# Patient Record
Sex: Female | Born: 1950
Health system: Southern US, Community
[De-identification: ages and names within clinical notes are randomized; demographics above are authoritative.]

## PROBLEM LIST (undated history)

## (undated) DIAGNOSIS — I480 Paroxysmal atrial fibrillation: Secondary | ICD-10-CM

## (undated) DIAGNOSIS — I4891 Unspecified atrial fibrillation: Secondary | ICD-10-CM

## (undated) DIAGNOSIS — R1310 Dysphagia, unspecified: Secondary | ICD-10-CM

## (undated) DIAGNOSIS — N200 Calculus of kidney: Secondary | ICD-10-CM

## (undated) DIAGNOSIS — K603 Anal fistula, unspecified: Secondary | ICD-10-CM

## (undated) DIAGNOSIS — I341 Nonrheumatic mitral (valve) prolapse: Secondary | ICD-10-CM

## (undated) DIAGNOSIS — N951 Menopausal and female climacteric states: Secondary | ICD-10-CM

## (undated) DIAGNOSIS — I10 Essential (primary) hypertension: Secondary | ICD-10-CM

## (undated) DIAGNOSIS — N3010 Interstitial cystitis (chronic) without hematuria: Secondary | ICD-10-CM

## (undated) DIAGNOSIS — E785 Hyperlipidemia, unspecified: Secondary | ICD-10-CM

## (undated) DIAGNOSIS — K589 Irritable bowel syndrome without diarrhea: Secondary | ICD-10-CM

## (undated) HISTORY — DX: Essential (primary) hypertension: I10

## (undated) HISTORY — DX: Paroxysmal atrial fibrillation: I48.0

## (undated) HISTORY — DX: Anal fistula, unspecified: K60.30

## (undated) HISTORY — DX: Nonrheumatic mitral (valve) prolapse: I34.1

## (undated) HISTORY — PX: CARDIAC ELECTROPHYSIOLOGY MAPPING AND ABLATION: SHX1292

## (undated) HISTORY — DX: Anal fistula: K60.3

## (undated) HISTORY — DX: Irritable bowel syndrome, unspecified: K58.9

## (undated) HISTORY — DX: Hyperlipidemia, unspecified: E78.5

## (undated) HISTORY — DX: Interstitial cystitis (chronic) without hematuria: N30.10

## (undated) HISTORY — DX: Unspecified atrial fibrillation: I48.91

## (undated) HISTORY — DX: Calculus of kidney: N20.0

## (undated) HISTORY — DX: Menopausal and female climacteric states: N95.1

## (undated) HISTORY — DX: Dysphagia, unspecified: R13.10

---

## 1975-11-23 HISTORY — PX: NASAL SEPTUM SURGERY: SHX37

## 1999-04-30 ENCOUNTER — Other Ambulatory Visit: Admission: RE | Admit: 1999-04-30 | Discharge: 1999-04-30 | Payer: Self-pay | Admitting: Internal Medicine

## 1999-05-06 ENCOUNTER — Ambulatory Visit (HOSPITAL_COMMUNITY): Admission: RE | Admit: 1999-05-06 | Discharge: 1999-05-06 | Payer: Self-pay | Admitting: Internal Medicine

## 2000-09-14 ENCOUNTER — Ambulatory Visit (HOSPITAL_COMMUNITY): Admission: RE | Admit: 2000-09-14 | Discharge: 2000-09-14 | Payer: Self-pay | Admitting: Internal Medicine

## 2000-09-14 ENCOUNTER — Encounter: Payer: Self-pay | Admitting: Internal Medicine

## 2001-05-15 ENCOUNTER — Ambulatory Visit (HOSPITAL_COMMUNITY): Admission: RE | Admit: 2001-05-15 | Discharge: 2001-05-15 | Payer: Self-pay | Admitting: Internal Medicine

## 2001-05-15 ENCOUNTER — Encounter: Payer: Self-pay | Admitting: Internal Medicine

## 2001-07-21 ENCOUNTER — Encounter: Payer: Self-pay | Admitting: *Deleted

## 2001-07-21 ENCOUNTER — Observation Stay (HOSPITAL_COMMUNITY): Admission: EM | Admit: 2001-07-21 | Discharge: 2001-07-22 | Payer: Self-pay | Admitting: Emergency Medicine

## 2001-07-28 ENCOUNTER — Ambulatory Visit (HOSPITAL_COMMUNITY): Admission: RE | Admit: 2001-07-28 | Discharge: 2001-07-28 | Payer: Self-pay | Admitting: *Deleted

## 2001-09-19 ENCOUNTER — Encounter: Payer: Self-pay | Admitting: Internal Medicine

## 2001-09-19 ENCOUNTER — Ambulatory Visit (HOSPITAL_COMMUNITY): Admission: RE | Admit: 2001-09-19 | Discharge: 2001-09-19 | Payer: Self-pay | Admitting: Internal Medicine

## 2001-09-26 ENCOUNTER — Other Ambulatory Visit: Admission: RE | Admit: 2001-09-26 | Discharge: 2001-09-26 | Payer: Self-pay | Admitting: Internal Medicine

## 2002-11-06 ENCOUNTER — Other Ambulatory Visit: Admission: RE | Admit: 2002-11-06 | Discharge: 2002-11-06 | Payer: Self-pay | Admitting: Internal Medicine

## 2003-11-11 ENCOUNTER — Ambulatory Visit (HOSPITAL_COMMUNITY): Admission: RE | Admit: 2003-11-11 | Discharge: 2003-11-11 | Payer: Self-pay | Admitting: Internal Medicine

## 2004-07-29 ENCOUNTER — Other Ambulatory Visit: Admission: RE | Admit: 2004-07-29 | Discharge: 2004-07-29 | Payer: Self-pay | Admitting: Internal Medicine

## 2005-02-10 ENCOUNTER — Ambulatory Visit: Payer: Self-pay | Admitting: Internal Medicine

## 2005-02-11 ENCOUNTER — Encounter: Admission: RE | Admit: 2005-02-11 | Discharge: 2005-02-11 | Payer: Self-pay | Admitting: Internal Medicine

## 2005-04-07 ENCOUNTER — Ambulatory Visit: Payer: Self-pay | Admitting: Internal Medicine

## 2005-09-06 ENCOUNTER — Inpatient Hospital Stay (HOSPITAL_COMMUNITY): Admission: EM | Admit: 2005-09-06 | Discharge: 2005-09-11 | Payer: Self-pay | Admitting: Emergency Medicine

## 2005-09-09 ENCOUNTER — Ambulatory Visit: Payer: Self-pay | Admitting: Internal Medicine

## 2005-11-25 ENCOUNTER — Ambulatory Visit: Payer: Self-pay | Admitting: Internal Medicine

## 2005-12-06 ENCOUNTER — Ambulatory Visit: Payer: Self-pay | Admitting: Internal Medicine

## 2005-12-06 ENCOUNTER — Inpatient Hospital Stay (HOSPITAL_COMMUNITY): Admission: EM | Admit: 2005-12-06 | Discharge: 2005-12-10 | Payer: Self-pay | Admitting: Emergency Medicine

## 2005-12-14 ENCOUNTER — Ambulatory Visit (HOSPITAL_COMMUNITY): Admission: RE | Admit: 2005-12-14 | Discharge: 2005-12-14 | Payer: Self-pay | Admitting: *Deleted

## 2005-12-16 ENCOUNTER — Encounter: Admission: RE | Admit: 2005-12-16 | Discharge: 2005-12-16 | Payer: Self-pay | Admitting: Sports Medicine

## 2006-01-04 ENCOUNTER — Ambulatory Visit: Payer: Self-pay | Admitting: Internal Medicine

## 2006-01-04 ENCOUNTER — Inpatient Hospital Stay (HOSPITAL_COMMUNITY): Admission: EM | Admit: 2006-01-04 | Discharge: 2006-01-08 | Payer: Self-pay | Admitting: Emergency Medicine

## 2006-01-04 ENCOUNTER — Ambulatory Visit: Payer: Self-pay | Admitting: Gastroenterology

## 2006-01-06 ENCOUNTER — Ambulatory Visit: Payer: Self-pay | Admitting: Gastroenterology

## 2006-01-14 ENCOUNTER — Emergency Department (HOSPITAL_COMMUNITY): Admission: EM | Admit: 2006-01-14 | Discharge: 2006-01-14 | Payer: Self-pay | Admitting: Emergency Medicine

## 2006-01-14 ENCOUNTER — Ambulatory Visit: Payer: Self-pay | Admitting: Internal Medicine

## 2006-02-09 ENCOUNTER — Ambulatory Visit: Payer: Self-pay | Admitting: Internal Medicine

## 2006-02-17 ENCOUNTER — Ambulatory Visit: Payer: Self-pay | Admitting: Family Medicine

## 2006-02-21 ENCOUNTER — Ambulatory Visit: Admission: RE | Admit: 2006-02-21 | Discharge: 2006-02-21 | Payer: Self-pay | Admitting: Internal Medicine

## 2006-02-21 ENCOUNTER — Ambulatory Visit: Payer: Self-pay | Admitting: Gastroenterology

## 2006-02-22 ENCOUNTER — Ambulatory Visit: Payer: Self-pay | Admitting: Gastroenterology

## 2006-03-03 ENCOUNTER — Ambulatory Visit: Payer: Self-pay | Admitting: Family Medicine

## 2006-03-17 ENCOUNTER — Encounter: Payer: Self-pay | Admitting: Gastroenterology

## 2006-03-17 ENCOUNTER — Ambulatory Visit (HOSPITAL_COMMUNITY): Admission: RE | Admit: 2006-03-17 | Discharge: 2006-03-17 | Payer: Self-pay | Admitting: Gastroenterology

## 2006-03-17 ENCOUNTER — Encounter (INDEPENDENT_AMBULATORY_CARE_PROVIDER_SITE_OTHER): Payer: Self-pay | Admitting: *Deleted

## 2006-03-22 ENCOUNTER — Ambulatory Visit: Payer: Self-pay | Admitting: Internal Medicine

## 2006-03-22 ENCOUNTER — Ambulatory Visit: Payer: Self-pay | Admitting: Gastroenterology

## 2006-03-24 ENCOUNTER — Ambulatory Visit: Payer: Self-pay | Admitting: Internal Medicine

## 2006-03-29 ENCOUNTER — Ambulatory Visit: Payer: Self-pay | Admitting: Family Medicine

## 2006-04-14 ENCOUNTER — Ambulatory Visit: Payer: Self-pay | Admitting: Family Medicine

## 2006-06-14 ENCOUNTER — Ambulatory Visit: Payer: Self-pay | Admitting: Family Medicine

## 2006-06-22 HISTORY — PX: OTHER SURGICAL HISTORY: SHX169

## 2006-08-25 ENCOUNTER — Other Ambulatory Visit: Admission: RE | Admit: 2006-08-25 | Discharge: 2006-08-25 | Payer: Self-pay | Admitting: Family Medicine

## 2006-08-25 ENCOUNTER — Ambulatory Visit: Payer: Self-pay | Admitting: Family Medicine

## 2006-08-30 DIAGNOSIS — F411 Generalized anxiety disorder: Secondary | ICD-10-CM | POA: Insufficient documentation

## 2006-08-30 DIAGNOSIS — I059 Rheumatic mitral valve disease, unspecified: Secondary | ICD-10-CM | POA: Insufficient documentation

## 2006-08-30 DIAGNOSIS — E7800 Pure hypercholesterolemia, unspecified: Secondary | ICD-10-CM

## 2006-08-30 DIAGNOSIS — J309 Allergic rhinitis, unspecified: Secondary | ICD-10-CM | POA: Insufficient documentation

## 2006-08-30 DIAGNOSIS — Z8679 Personal history of other diseases of the circulatory system: Secondary | ICD-10-CM | POA: Insufficient documentation

## 2006-08-30 DIAGNOSIS — I4891 Unspecified atrial fibrillation: Secondary | ICD-10-CM | POA: Insufficient documentation

## 2006-09-08 ENCOUNTER — Ambulatory Visit: Payer: Self-pay | Admitting: Family Medicine

## 2006-10-17 ENCOUNTER — Telehealth (INDEPENDENT_AMBULATORY_CARE_PROVIDER_SITE_OTHER): Payer: Self-pay | Admitting: *Deleted

## 2006-10-24 ENCOUNTER — Ambulatory Visit: Payer: Self-pay | Admitting: Family Medicine

## 2006-10-24 DIAGNOSIS — M25569 Pain in unspecified knee: Secondary | ICD-10-CM | POA: Insufficient documentation

## 2006-10-24 DIAGNOSIS — R1031 Right lower quadrant pain: Secondary | ICD-10-CM

## 2006-11-08 ENCOUNTER — Encounter: Payer: Self-pay | Admitting: Family Medicine

## 2006-11-09 ENCOUNTER — Telehealth: Payer: Self-pay | Admitting: Family Medicine

## 2006-11-10 ENCOUNTER — Encounter: Payer: Self-pay | Admitting: Family Medicine

## 2006-11-17 ENCOUNTER — Ambulatory Visit: Payer: Self-pay | Admitting: Family Medicine

## 2006-11-17 DIAGNOSIS — N3010 Interstitial cystitis (chronic) without hematuria: Secondary | ICD-10-CM | POA: Insufficient documentation

## 2006-11-22 ENCOUNTER — Encounter: Payer: Self-pay | Admitting: Family Medicine

## 2006-11-23 ENCOUNTER — Telehealth (INDEPENDENT_AMBULATORY_CARE_PROVIDER_SITE_OTHER): Payer: Self-pay | Admitting: *Deleted

## 2006-11-23 LAB — CONVERTED CEMR LAB
CA 125: 6.2 units/mL (ref 0.0–30.2)
RBC / HPF: NONE SEEN (ref <3)
WBC, UA: NONE SEEN cells/hpf (ref <3)

## 2006-11-25 ENCOUNTER — Telehealth: Payer: Self-pay | Admitting: Family Medicine

## 2006-12-06 ENCOUNTER — Telehealth (INDEPENDENT_AMBULATORY_CARE_PROVIDER_SITE_OTHER): Payer: Self-pay | Admitting: *Deleted

## 2006-12-27 ENCOUNTER — Encounter: Payer: Self-pay | Admitting: Family Medicine

## 2007-01-09 ENCOUNTER — Encounter: Payer: Self-pay | Admitting: Cardiology

## 2007-01-09 ENCOUNTER — Ambulatory Visit: Payer: Self-pay

## 2007-01-13 ENCOUNTER — Encounter: Payer: Self-pay | Admitting: Family Medicine

## 2007-02-20 ENCOUNTER — Encounter: Payer: Self-pay | Admitting: Family Medicine

## 2007-04-24 ENCOUNTER — Encounter: Payer: Self-pay | Admitting: Family Medicine

## 2007-08-15 ENCOUNTER — Ambulatory Visit: Payer: Self-pay | Admitting: Family Medicine

## 2007-08-15 LAB — CONVERTED CEMR LAB: Rapid Strep: NEGATIVE

## 2007-09-18 ENCOUNTER — Telehealth: Payer: Self-pay | Admitting: Family Medicine

## 2007-09-21 ENCOUNTER — Other Ambulatory Visit: Admission: RE | Admit: 2007-09-21 | Discharge: 2007-09-21 | Payer: Self-pay | Admitting: Family Medicine

## 2007-09-21 ENCOUNTER — Encounter: Payer: Self-pay | Admitting: Family Medicine

## 2007-09-22 ENCOUNTER — Ambulatory Visit: Payer: Self-pay | Admitting: Family Medicine

## 2007-09-22 LAB — CONVERTED CEMR LAB
ALT: 20 units/L (ref 0–35)
AST: 19 units/L (ref 0–37)
Albumin: 4.4 g/dL (ref 3.5–5.2)
Alkaline Phosphatase: 70 units/L (ref 39–117)
BUN: 14 mg/dL (ref 6–23)
CO2: 25 meq/L (ref 19–32)
Calcium: 9.5 mg/dL (ref 8.4–10.5)
Chloride: 107 meq/L (ref 96–112)
Cholesterol, target level: 200 mg/dL
Cholesterol: 212 mg/dL — ABNORMAL HIGH (ref 0–200)
Creatinine, Ser: 0.66 mg/dL (ref 0.40–1.20)
Glucose, Bld: 93 mg/dL (ref 70–99)
HCT: 40 % (ref 36.0–46.0)
HDL goal, serum: 40 mg/dL
HDL: 50 mg/dL (ref >39)
Hemoglobin: 13.2 g/dL (ref 12.0–15.0)
LDL Cholesterol: 128 mg/dL — ABNORMAL HIGH (ref 0–99)
LDL Goal: 160 mg/dL
MCHC: 33 g/dL (ref 30.0–36.0)
MCV: 89.7 fL (ref 78.0–100.0)
Platelets: 262 10*3/uL (ref 150–400)
Potassium: 3.9 meq/L (ref 3.5–5.3)
RBC: 4.46 M/uL (ref 3.87–5.11)
RDW: 12.6 % (ref 11.5–14.0)
Sodium: 143 meq/L (ref 135–145)
TSH: 1.661 microintl units/mL (ref 0.350–5.50)
Total Bilirubin: 0.7 mg/dL (ref 0.3–1.2)
Total CHOL/HDL Ratio: 4.2
Total Protein: 7.3 g/dL (ref 6.0–8.3)
Triglycerides: 170 mg/dL — ABNORMAL HIGH (ref <150)
VLDL: 34 mg/dL (ref 0–40)
WBC: 5.5 10*3/uL (ref 4.0–10.5)

## 2007-09-26 LAB — CONVERTED CEMR LAB: Pap Smear: NORMAL

## 2007-10-03 ENCOUNTER — Encounter: Admission: RE | Admit: 2007-10-03 | Discharge: 2007-10-03 | Payer: Self-pay | Admitting: Family Medicine

## 2007-10-04 ENCOUNTER — Ambulatory Visit: Payer: Self-pay | Admitting: Cardiology

## 2007-10-18 ENCOUNTER — Ambulatory Visit: Payer: Self-pay | Admitting: Cardiology

## 2007-10-31 ENCOUNTER — Telehealth: Payer: Self-pay | Admitting: Family Medicine

## 2007-12-07 ENCOUNTER — Telehealth: Payer: Self-pay | Admitting: Family Medicine

## 2007-12-13 ENCOUNTER — Ambulatory Visit: Payer: Self-pay | Admitting: Cardiology

## 2007-12-13 ENCOUNTER — Encounter: Admission: RE | Admit: 2007-12-13 | Discharge: 2007-12-13 | Payer: Self-pay | Admitting: Family Medicine

## 2007-12-29 ENCOUNTER — Ambulatory Visit: Payer: Self-pay | Admitting: Family Medicine

## 2007-12-29 DIAGNOSIS — L259 Unspecified contact dermatitis, unspecified cause: Secondary | ICD-10-CM

## 2008-01-18 ENCOUNTER — Telehealth (INDEPENDENT_AMBULATORY_CARE_PROVIDER_SITE_OTHER): Payer: Self-pay | Admitting: *Deleted

## 2008-03-06 ENCOUNTER — Ambulatory Visit: Payer: Self-pay | Admitting: Cardiology

## 2008-03-22 ENCOUNTER — Ambulatory Visit: Payer: Self-pay | Admitting: Family Medicine

## 2008-03-22 DIAGNOSIS — H9209 Otalgia, unspecified ear: Secondary | ICD-10-CM | POA: Insufficient documentation

## 2008-03-25 ENCOUNTER — Telehealth: Payer: Self-pay | Admitting: Family Medicine

## 2008-03-27 ENCOUNTER — Telehealth: Payer: Self-pay | Admitting: Family Medicine

## 2008-05-15 ENCOUNTER — Encounter: Admission: RE | Admit: 2008-05-15 | Discharge: 2008-05-15 | Payer: Self-pay | Admitting: Family Medicine

## 2008-05-15 ENCOUNTER — Ambulatory Visit: Payer: Self-pay | Admitting: Family Medicine

## 2008-05-15 DIAGNOSIS — M25539 Pain in unspecified wrist: Secondary | ICD-10-CM

## 2008-05-15 DIAGNOSIS — M25579 Pain in unspecified ankle and joints of unspecified foot: Secondary | ICD-10-CM

## 2008-09-03 ENCOUNTER — Telehealth: Payer: Self-pay | Admitting: Family Medicine

## 2008-09-06 ENCOUNTER — Ambulatory Visit: Payer: Self-pay | Admitting: Occupational Medicine

## 2008-09-18 ENCOUNTER — Ambulatory Visit: Payer: Self-pay | Admitting: Cardiology

## 2008-09-20 ENCOUNTER — Encounter: Payer: Self-pay | Admitting: Family Medicine

## 2008-09-20 LAB — CONVERTED CEMR IMAGING

## 2008-09-23 LAB — CONVERTED CEMR LAB
ALT: 14 units/L (ref 0–35)
AST: 16 units/L (ref 0–37)
Albumin: 4.6 g/dL (ref 3.5–5.2)
Alkaline Phosphatase: 79 units/L (ref 39–117)
BUN: 17 mg/dL (ref 6–23)
CO2: 25 meq/L (ref 19–32)
Calcium: 9.6 mg/dL (ref 8.4–10.5)
Chloride: 105 meq/L (ref 96–112)
Cholesterol: 208 mg/dL — ABNORMAL HIGH (ref 0–200)
Creatinine, Ser: 0.7 mg/dL (ref 0.40–1.20)
Glucose, Bld: 100 mg/dL — ABNORMAL HIGH (ref 70–99)
HDL: 52 mg/dL (ref >39)
LDL Cholesterol: 131 mg/dL — ABNORMAL HIGH (ref 0–99)
Potassium: 4.2 meq/L (ref 3.5–5.3)
Sodium: 142 meq/L (ref 135–145)
TSH: 3.251 microintl units/mL (ref 0.350–4.50)
Total Bilirubin: 1 mg/dL (ref 0.3–1.2)
Total CHOL/HDL Ratio: 4
Total Protein: 7.5 g/dL (ref 6.0–8.3)
Triglycerides: 123 mg/dL (ref <150)
VLDL: 25 mg/dL (ref 0–40)

## 2008-09-25 ENCOUNTER — Ambulatory Visit: Payer: Self-pay | Admitting: Family Medicine

## 2008-09-25 ENCOUNTER — Other Ambulatory Visit: Admission: RE | Admit: 2008-09-25 | Discharge: 2008-09-25 | Payer: Self-pay | Admitting: Family Medicine

## 2008-09-25 ENCOUNTER — Encounter: Payer: Self-pay | Admitting: Family Medicine

## 2008-09-25 DIAGNOSIS — E348 Other specified endocrine disorders: Secondary | ICD-10-CM | POA: Insufficient documentation

## 2008-09-25 LAB — CONVERTED CEMR LAB: Blood Glucose, Fasting: 109 mg/dL

## 2008-09-30 LAB — CONVERTED CEMR LAB: Pap Smear: NORMAL

## 2008-10-15 ENCOUNTER — Encounter: Payer: Self-pay | Admitting: Family Medicine

## 2008-10-15 ENCOUNTER — Encounter: Admission: RE | Admit: 2008-10-15 | Discharge: 2008-10-15 | Payer: Self-pay | Admitting: Family Medicine

## 2008-10-15 DIAGNOSIS — M810 Age-related osteoporosis without current pathological fracture: Secondary | ICD-10-CM

## 2008-10-29 ENCOUNTER — Encounter: Payer: Self-pay | Admitting: Family Medicine

## 2008-11-13 ENCOUNTER — Ambulatory Visit: Payer: Self-pay | Admitting: Family Medicine

## 2008-11-13 DIAGNOSIS — K219 Gastro-esophageal reflux disease without esophagitis: Secondary | ICD-10-CM | POA: Insufficient documentation

## 2009-01-09 ENCOUNTER — Ambulatory Visit: Payer: Self-pay | Admitting: Family Medicine

## 2009-01-09 DIAGNOSIS — M542 Cervicalgia: Secondary | ICD-10-CM | POA: Insufficient documentation

## 2009-02-10 ENCOUNTER — Ambulatory Visit: Payer: Self-pay | Admitting: Internal Medicine

## 2009-02-10 ENCOUNTER — Encounter: Payer: Self-pay | Admitting: Internal Medicine

## 2009-06-02 ENCOUNTER — Telehealth: Payer: Self-pay | Admitting: Family Medicine

## 2009-07-02 ENCOUNTER — Encounter (INDEPENDENT_AMBULATORY_CARE_PROVIDER_SITE_OTHER): Payer: Self-pay | Admitting: *Deleted

## 2009-09-24 ENCOUNTER — Ambulatory Visit: Payer: Self-pay | Admitting: Family Medicine

## 2009-10-13 ENCOUNTER — Ambulatory Visit: Payer: Self-pay | Admitting: Family Medicine

## 2009-10-14 ENCOUNTER — Encounter: Payer: Self-pay | Admitting: Family Medicine

## 2009-11-18 ENCOUNTER — Ambulatory Visit: Payer: Self-pay | Admitting: Family Medicine

## 2009-11-18 LAB — CONVERTED CEMR LAB: Rapid Strep: NEGATIVE

## 2010-01-06 ENCOUNTER — Telehealth: Payer: Self-pay | Admitting: Family Medicine

## 2010-01-26 ENCOUNTER — Telehealth (INDEPENDENT_AMBULATORY_CARE_PROVIDER_SITE_OTHER): Payer: Self-pay | Admitting: *Deleted

## 2010-01-30 ENCOUNTER — Ambulatory Visit: Payer: Self-pay | Admitting: Family Medicine

## 2010-02-01 LAB — CONVERTED CEMR LAB
ALT: 20 units/L (ref 0–35)
AST: 16 units/L (ref 0–37)
Albumin: 4.6 g/dL (ref 3.5–5.2)
Alkaline Phosphatase: 70 units/L (ref 39–117)
BUN: 14 mg/dL (ref 6–23)
CO2: 25 meq/L (ref 19–32)
Calcium: 9.9 mg/dL (ref 8.4–10.5)
Chloride: 105 meq/L (ref 96–112)
Creatinine, Ser: 0.65 mg/dL (ref 0.40–1.20)
Glucose, Bld: 101 mg/dL — ABNORMAL HIGH (ref 70–99)
HCT: 38 % (ref 36.0–46.0)
HDL: 52 mg/dL (ref >39)
Hemoglobin: 12.8 g/dL (ref 12.0–15.0)
LDL Cholesterol: 130 mg/dL — ABNORMAL HIGH (ref 0–99)
MCHC: 33.7 g/dL (ref 30.0–36.0)
Potassium: 3.9 meq/L (ref 3.5–5.3)
RBC: 4.35 M/uL (ref 3.87–5.11)
RDW: 12.5 % (ref 11.5–15.5)
TSH: 2.643 microintl units/mL (ref 0.350–4.500)
Total Bilirubin: 0.8 mg/dL (ref 0.3–1.2)
Total CHOL/HDL Ratio: 4
Total Protein: 7.4 g/dL (ref 6.0–8.3)
Triglycerides: 130 mg/dL (ref <150)
VLDL: 26 mg/dL (ref 0–40)
Vit D, 25-Hydroxy: 24 ng/mL — ABNORMAL LOW (ref 30–89)

## 2010-02-10 ENCOUNTER — Encounter: Admission: RE | Admit: 2010-02-10 | Discharge: 2010-02-10 | Payer: Self-pay | Admitting: Family Medicine

## 2010-02-12 LAB — HM MAMMOGRAPHY: HM Mammogram: NEGATIVE

## 2010-09-23 ENCOUNTER — Ambulatory Visit: Payer: Self-pay | Admitting: Family Medicine

## 2010-09-23 LAB — CONVERTED CEMR LAB: Blood Glucose, Fingerstick: 127

## 2010-10-07 ENCOUNTER — Ambulatory Visit: Payer: Self-pay | Admitting: Cardiology

## 2010-10-07 ENCOUNTER — Encounter: Payer: Self-pay | Admitting: Cardiology

## 2010-10-07 DIAGNOSIS — R002 Palpitations: Secondary | ICD-10-CM | POA: Insufficient documentation

## 2010-10-07 DIAGNOSIS — I4949 Other premature depolarization: Secondary | ICD-10-CM

## 2010-12-13 ENCOUNTER — Encounter: Payer: Self-pay | Admitting: Internal Medicine

## 2010-12-13 ENCOUNTER — Encounter: Payer: Self-pay | Admitting: Family Medicine

## 2010-12-24 NOTE — Assessment & Plan Note (Signed)
Summary: CPE   Vital Signs:  Patient profile:   60 year old female Height:      63 inches Weight:      139 pounds Pulse rate:   64 / minute BP sitting:   109 / 65  (left arm) Cuff size:   regular  Vitals Entered By: Kathlene November (January 30, 2010 9:22 AM) CC: CPE no pap   Primary Care Provider:  Linford Arnold, C  CC:  CPE no pap.  History of Present Illness: Here for CPE. No specific complaints except for feeling her her left side of her jaw to her temple is "drawing up".  Started about a week ago.  Seems to be worse in the AM after sleeping in her side.  No change in hearing (though hx of chronic hearing loss).  No visioin change in that eye.  Has dentures adn no teeth. She was laid off from work in Feb.    Current Medications (verified): 1)  Aspir-Low 81 Mg Tbec (Aspirin) .... Once Daily 2)  Xanax 0.25 Mg Tabs (Alprazolam) .... Two Times A Day As Needed 3)  One-A-Day Essential  Tabs (Multiple Vitamin) .... Take 1 Tablet By Mouth Once A Day 4)  Metoprolol Tartrate 25 Mg  Tabs (Metoprolol Tartrate) .... Take 1 Tablet By Mouth Two Times A Day  Allergies (verified): 1)  ! Ampicillin 2)  ! Codeine 3)  ! Biaxin 4)  ! * Flecanide 5)  ! * Nasonex-Ha 6)  ! Azithromycin (Azithromycin) 7)  ! Penicillin 8)  Keflex (Cephalexin)  Comments:  Nurse/Medical Assistant: The patient's medications and allergies were reviewed with the patient and were updated in the Medication and Allergy Lists. Kathlene November (January 30, 2010 9:23 AM)  Past History:  Past Medical History: Last updated: 02/07/2009 75% Hearing Loss, difficulty swallowing sec need for dentures menopausal Hx of atrila flutter s/p ablation.  Hx of paroxysmal a fib.  Resistant recurrent atrial fibrillation/flutter Swallow dysfunction Possible hypokalemia Dyslipidemia Mitral valve prolapse Nonobstructive coronary artery disease by catheterization September 2; ejection fraction 65%. Remote history of irritable bowel  syndrome. hypertension  Past Surgical History: Last updated: 01/13/2007 Abd U/S-negative c/sec  1971, 1973 Deviated septum  1977 Pulmonary Vein Isolation procedure-for A-fib on 8/07 at [facility]  Family History: Last updated: 08/30/2006 DM-GM, HTN-mom, MI-Dad, brother, Stroke-Aunt  Social History: Unemployed..  GED.  Divorced.  2 adult children.  Never smoked, no EtOH, no drugs, no caffeine.  No regular exercise.  Review of Systems  The patient denies anorexia, fever, weight loss, weight gain, vision loss, decreased hearing, hoarseness, chest pain, syncope, dyspnea on exertion, peripheral edema, prolonged cough, headaches, hemoptysis, abdominal pain, melena, hematochezia, severe indigestion/heartburn, hematuria, incontinence, genital sores, muscle weakness, suspicious skin lesions, transient blindness, difficulty walking, depression, unusual weight change, abnormal bleeding, enlarged lymph nodes, and breast masses.    Physical Exam  General:  Well-developed,well-nourished,in no acute distress; alert,appropriate and cooperative throughout examination Head:  Normocephalic and atraumatic without obvious abnormalities. No apparent alopecia or balding. Eyes:  No corneal or conjunctival inflammation noted. EOMI. Perrla. Ears:  External ear exam shows no significant lesions or deformities.  Otoscopic examination reveals clear canals, tympanic membranes are intact bilaterally without bulging, retraction, inflammation or discharge. Hearing is grossly normal bilaterally. Nose:  External nasal examination shows no deformity or inflammation. Nasal mucosa are pink and moist without lesions or exudates. Mouth:  Oral mucosa and oropharynx without lesions or exudates.  Teeth in good repair. Neck:  No deformities, masses, or tenderness  noted. Chest Wall:  No deformities, masses, or tenderness noted. Breasts:  No mass, nodules, thickening, tenderness, bulging, retraction, inflamation, nipple  discharge or skin changes noted.   Lungs:  Normal respiratory effort, chest expands symmetrically. Lungs are clear to auscultation, no crackles or wheezes. Heart:  Normal rate and regular rhythm. S1 and S2 normal without gallop, murmur, click, rub or other extra sounds. Abdomen:  Bowel sounds positive,abdomen soft and non-tender without masses, organomegaly or hernias noted. Msk:  No deformity or scoliosis noted of thoracic or lumbar spine.   Pulses:  R and L carotid,radial,dorsalis pedis and posterior tibial pulses are full and equal bilaterally Extremities:  No clubbing, cyanosis, edema, or deformity noted with normal full range of motion of all joints.   Neurologic:  No cranial nerve deficits noted. Station and gait are normal.DTRs are symmetrical throughout. Sensory, motor and coordinative functions appear intact. Skin:  no rashes.   Cervical Nodes:  No lymphadenopathy noted Psych:  Cognition and judgment appear intact. Alert and cooperative with normal attention span and concentration. No apparent delusions, illusions, hallucinations   Impression & Recommendations:  Problem # 1:  HEALTH MAINTENANCE EXAM (ICD-V70.0) Discussed regular exercise as much as possible with her palpitaitons. She is very limited by this.  Due for screening labs today as well a a mammogram and bone density Encouraged daily calcium (let her know they do have chewables available since can't take pills well). She says that is why she hadn't been taking them. Due for her pap next year.   Her colonosocpy is up to date.  BP looks great today.  Orders: T-Comprehensive Metabolic Panel ([id number]) T-Mammography, Diagnostic (bilateral) (65784)  Complete Medication List: 1)  Aspir-low 81 Mg Tbec (Aspirin) .... Once daily 2)  Xanax 0.25 Mg Tabs (Alprazolam) .... Two times a day as needed 3)  One-a-day Essential Tabs (Multiple vitamin) .... Take 1 tablet by mouth once a day 4)  Metoprolol Tartrate 25 Mg Tabs  (Metoprolol tartrate) .... Take 1 tablet by mouth two times a day  Other Orders: T-Lipid Profile [id number]) T-Vitamin D (25-Hydroxy) [id number]) T-TSH [phone]) T-Dual DXA Bone Density/ Axial (42595)  Patient Instructions: 1)  Start Activa yogurt one cup a day for 3 weeks. Call me and let me know if stool leakage is much better or not at that piont. 2)  Encourage you to take calcium with vitamin D daily, at least once a day.   Flex Sig Next Due:  Not Indicated Colonoscopy Result Date:  11/22/2005 Colonoscopy Result:  normal Colonoscopy Next Due:  10 yr Hemoccult Next Due:  Refused Last PAP:  Normal (09/30/2008 1:33:38 PM) PAP Next Due:  3 yr

## 2010-12-24 NOTE — Assessment & Plan Note (Signed)
Summary: flu shot - jr  Nurse Visit   Primary Care Provider:  Linford Arnold, C   History of Present Illness: Pt request to have her glucose checked. Pt states she has been waking up at night with frequent urination.Advised to f/u with Dr. Linford Arnold   Allergies: 1)  ! Ampicillin 2)  ! Codeine 3)  ! Biaxin 4)  ! * Flecanide 5)  ! * Nasonex-Ha 6)  ! Azithromycin (Azithromycin) 7)  ! Penicillin 8)  Keflex (Cephalexin) Laboratory Results   Blood Tests   Date/Time Received: 09/23/10 Date/Time Reported: 09/23/10  CBG Random:: 127mg /dL     Immunizations Administered:  Influenza Vaccine # 1:    Vaccine Type: Fluvax 3+    Site: left deltoid    Mfr: GlaxoSmithKline    Dose: 0.5 ml    Route: IM    Given by: Sue Lush McCrimmon CMA, (AAMA)    Exp. Date: 05/22/2011    Lot #: [id number]    VIS given: 06/16/10 version given September 23, 2010.  Flu Vaccine Consent Questions:    Do you have a history of severe allergic reactions to this vaccine? no    Any prior history of allergic reactions to egg and/or gelatin? no    Do you have a sensitivity to the preservative Thimersol? no    Do you have a past history of Guillan-Barre Syndrome? no    Do you currently have an acute febrile illness? no    Have you ever had a severe reaction to latex? no    Vaccine information given and explained to patient? no    Are you currently pregnant? no  Orders Added: 1)  Flu Vaccine 75yrs + [90658] 2)  Admin 1st Vaccine [90471] 3)  Fingerstick [36416] 4)  Glucose, (CBG) [82962]

## 2010-12-24 NOTE — Progress Notes (Signed)
Summary: Xanax refill  Phone Note Call from Patient Call back at [phone]   Caller: Patient Call For: Malon Siddall Summary of Call: Pt wanted to know if you would call her in some of her Xanax #30 rather than 60 to [facility] in [place]. Pt states has been at least a year since last had but level anxiety up a little right now Initial call taken by: Kathlene November,  January 06, 2010 9:37 AM    Prescriptions: Prudy Feeler 0.25 MG TABS (ALPRAZOLAM) two times a day as needed  #30 x 0   Entered and Authorized by:   Seymour Bars DO   Signed by:   Seymour Bars DO on 01/06/2010   Method used:   Printed then faxed to ...       [facility] [phone]* (retail)       [address] [place], [place]  [address]       Ph: [phone]       Fax: [phone]   RxID:   [id number]

## 2010-12-24 NOTE — Progress Notes (Signed)
Summary: left message for patient to call our office  Phone Note Call from Patient Call back at [phone]   Caller: Patient Call For: Nani Gasser MD Reason for Call: Insurance Question Summary of Call: PT dropped off form for [facility] [employer] [facility].  Our office does not complete these for patients.  Patient will need to provide a copy of her approval letter for indigent care from [facility] (if she has one) that specifies the date range in which she is covered to [employer] and they will approve indigent care for her as well for the specified date range.  Left message for patient to call our office so that we can explain to her. Initial call taken by: Fabienne Bruns,  January 26, 2010 4:09 PM

## 2010-12-24 NOTE — Assessment & Plan Note (Signed)
Summary: [facility]   Visit Type:  Follow-up Primary Provider:  Linford Arnold, C  CC:  Heart flutter.  History of Present Illness: Stephanie Rollins is a 60 year old female who has a history of SVT as well as atrial fibrillation and mitral valve prolapse.  Cardiac catheterization in September of 2002 showed normal coronary arteries and normal LV function with an ejection fraction of 65%. Echocardiogram in 2008 showed normal LV function and trace mitral regurgitation. She has had a previous ablation of her atrial fibrillation in August 2007 at [facility]. She has also had recurrent palpitations secondary to PVCs treated with beta blockade. Since I last saw her in Oct 2009, she has dyspnea with more extreme activities but not with routine activities. It is relieved with rest. There is no orthopnea, PND or pedal edema. She has an occasional pain in the left chest described as a stabbing sensation lasting one to 2 seconds. She does not have exertional pain. She continues to have occasional brief flutters but no sustained palpitations. There is no syncope.   Current Medications (verified): 1)  Aspir-Low 81 Mg Tbec (Aspirin) .... Once Daily 2)  Xanax 0.25 Mg Tabs (Alprazolam) .... Two Times A Day As Needed 3)  One-A-Day Essential  Tabs (Multiple Vitamin) .... Take 1 Tablet By Mouth Once A Day 4)  Metoprolol Tartrate 25 Mg  Tabs (Metoprolol Tartrate) .... Take 1 Tablet By Mouth Two Times A Day  Allergies: 1)  ! Ampicillin 2)  ! Codeine 3)  ! Biaxin 4)  ! * Flecanide 5)  ! * Nasonex-Ha 6)  ! Azithromycin (Azithromycin) 7)  ! Penicillin 8)  Keflex (Cephalexin)  Past History:  Past Medical History: 75% Hearing Loss, difficulty swallowing sec need for dentures menopausal Hx of paroxysmal a fib. s/p ablation Dyslipidemia Mitral valve prolapse Remote history of irritable bowel syndrome. hypertension  Past Surgical History: c/sec  1971, 1973 Deviated septum  1977 Pulmonary Vein  Isolation procedure-for A-fib on 8/07 at [facility]  Social History: Reviewed history from 01/30/2010 and no changes required. Unemployed..  GED.  Divorced.  2 adult children.  Never smoked, no EtOH, no drugs, no caffeine.  No regular exercise.  Review of Systems       Occasional neck pain butno fevers or chills, productive cough, hemoptysis, dysphasia, odynophagia, melena, hematochezia, dysuria, hematuria, rash, seizure activity, orthopnea, PND, pedal edema, claudication. Remaining systems are negative.   Vital Signs:  Patient profile:   60 year old female Height:      63 inches Weight:      140 pounds BMI:     24.89 Pulse rate:   58 / minute Pulse rhythm:   regular Resp:     18 per minute BP sitting:   112 / 65  (left arm) Cuff size:   large  Vitals Entered By: Vikki Ports (October 07, 2010 3:36 PM)  Physical Exam  General:  Well-developed well-nourished in no acute distress.  Skin is warm and dry.  HEENT is normal.  Neck is supple. No thyromegaly.  Chest is clear to auscultation with normal expansion.  Cardiovascular exam is regular rate and rhythm.  Abdominal exam nontender or distended. No masses palpated. Extremities show no edema. neuro grossly intact    EKG  Procedure date:  10/07/2010  Findings:      Sinus rhythm at a rate of 60. No significant ST changes.  Impression & Recommendations:  Problem # 1:  CHEST PAIN, ATYPICAL (ICD-786.59) Symptoms atypical and noncardiac. No further workup  at this time. Her updated medication list for this problem includes:    Aspir-low 81 Mg Tbec (Aspirin) ..... Once daily    Metoprolol Tartrate 25 Mg Tabs (Metoprolol tartrate) .Marland Kitchen... Take 1 tablet by mouth two times a day  Her updated medication list for this problem includes:    Aspir-low 81 Mg Tbec (Aspirin) ..... Once daily    Metoprolol Tartrate 25 Mg Tabs (Metoprolol tartrate) .Marland Kitchen... Take 1 tablet by mouth two times a day  Problem # 2:  MITRAL VALVE DISORDER  (ICD-424.0)  Her updated medication list for this problem includes:    Metoprolol Tartrate 25 Mg Tabs (Metoprolol tartrate) .Marland Kitchen... Take 1 tablet by mouth two times a day  Problem # 3:  ATRIAL FIBRILLATION (ICD-427.31) Status post ablation. No recurrences. She also had atrial flutter ablated. Her updated medication list for this problem includes:    Aspir-low 81 Mg Tbec (Aspirin) ..... Once daily    Metoprolol Tartrate 25 Mg Tabs (Metoprolol tartrate) .Marland Kitchen... Take 1 tablet by mouth two times a day  Problem # 4:  PALPITATIONS (ICD-785.1) Symptoms unchanged. Continue beta blocker. Previous evaluation suggests PVCs. Her updated medication list for this problem includes:    Aspir-low 81 Mg Tbec (Aspirin) ..... Once daily    Metoprolol Tartrate 25 Mg Tabs (Metoprolol tartrate) .Marland Kitchen... Take 1 tablet by mouth two times a day  Problem # 5:  PREMATURE VENTRICULAR CONTRACTIONS (ICD-427.69) As per #4. Her updated medication list for this problem includes:    Aspir-low 81 Mg Tbec (Aspirin) ..... Once daily    Metoprolol Tartrate 25 Mg Tabs (Metoprolol tartrate) .Marland Kitchen... Take 1 tablet by mouth two times a day  Patient Instructions: 1)  Your physician wants you to follow-up in: ONE YEAR You will receive a reminder letter in the mail two months in advance. If you don't receive a letter, please call our office to schedule the follow-up appointment.

## 2010-12-31 ENCOUNTER — Telehealth: Payer: Self-pay | Admitting: Family Medicine

## 2011-01-07 NOTE — Progress Notes (Signed)
Summary: [facility]-Refill Xanax  Phone Note Refill Request Call back at Home Phone [phone] Message from:  Patient  Refills Requested: Medication #1:  XANAX 0.25 MG TABS two times a day as needed   Dosage confirmed as above?Dosage Confirmed   Supply Requested: 1 month   Last Refilled: 01/06/2010 [facility] faxed refill request last week   Method Requested: Fax to Local Pharmacy Initial call taken by: Francee Piccolo CMA Duncan Dull),  December 31, 2010 4:18 PM  Follow-up for Phone Call        Rx faxed to pharmacy Follow-up by: Nani Gasser MD,  December 31, 2010 4:35 PM  Additional Follow-up for Phone Call Additional follow up Details #1::        advised pt via voicemail rx has been sent to pharmacy Additional Follow-up by: Francee Piccolo CMA Duncan Dull),  December 31, 2010 4:43 PM    Prescriptions: XANAX 0.25 MG TABS (ALPRAZOLAM) two times a day as needed  #30 x 0   Entered and Authorized by:   Nani Gasser MD   Signed by:   Nani Gasser MD on 12/31/2010   Method used:   Printed then faxed to ...       [facility] [phone]* (retail)       [address] [place], [place]  [address]       Ph: [phone]       Fax: [phone]   RxID:   [id number]

## 2011-01-28 ENCOUNTER — Telehealth: Payer: Self-pay | Admitting: Family Medicine

## 2011-01-29 ENCOUNTER — Encounter: Payer: Self-pay | Admitting: Family Medicine

## 2011-01-29 ENCOUNTER — Other Ambulatory Visit: Payer: Self-pay | Admitting: Family Medicine

## 2011-01-29 ENCOUNTER — Ambulatory Visit (INDEPENDENT_AMBULATORY_CARE_PROVIDER_SITE_OTHER): Payer: Self-pay | Admitting: Family Medicine

## 2011-01-29 DIAGNOSIS — R109 Unspecified abdominal pain: Secondary | ICD-10-CM

## 2011-01-29 LAB — CBC WITH DIFFERENTIAL/PLATELET
Basophils Absolute: 0 10*3/uL (ref 0.0–0.1)
Basophils Relative: 0 % (ref 0–1)
Eosinophils Absolute: 0.1 10*3/uL (ref 0.0–0.7)
MCH: 30 pg (ref 26.0–34.0)
MCHC: 34.7 g/dL (ref 30.0–36.0)
Neutro Abs: 2.7 10*3/uL (ref 1.7–7.7)
Neutrophils Relative %: 51 % (ref 43–77)
RDW: 12.8 % (ref 11.5–15.5)

## 2011-01-29 LAB — CONVERTED CEMR LAB
ALT: 18 units/L (ref 0–35)
AST: 18 units/L (ref 0–37)
Alkaline Phosphatase: 61 units/L (ref 39–117)
BUN: 7 mg/dL (ref 6–23)
Basophils Absolute: 0 10*3/uL (ref 0.0–0.1)
Basophils Relative: 0 % (ref 0–1)
Chloride: 100 meq/L (ref 96–112)
Creatinine, Ser: 0.7 mg/dL (ref 0.40–1.20)
Eosinophils Absolute: 0.1 10*3/uL (ref 0.0–0.7)
Hemoglobin: 13 g/dL (ref 12.0–15.0)
MCHC: 34.7 g/dL (ref 30.0–36.0)
MCV: 86.4 fL (ref 78.0–100.0)
Monocytes Absolute: 0.3 10*3/uL (ref 0.1–1.0)
Monocytes Relative: 6 % (ref 3–12)
Neutro Abs: 2.7 10*3/uL (ref 1.7–7.7)
RBC: 4.33 M/uL (ref 3.87–5.11)
RDW: 12.8 % (ref 11.5–15.5)
Total Bilirubin: 0.9 mg/dL (ref 0.3–1.2)

## 2011-01-29 LAB — COMPREHENSIVE METABOLIC PANEL
AST: 18 U/L (ref 0–37)
Albumin: 4.3 g/dL (ref 3.5–5.2)
Alkaline Phosphatase: 61 U/L (ref 39–117)
BUN: 7 mg/dL (ref 6–23)
Potassium: 3.8 mEq/L (ref 3.5–5.3)
Sodium: 140 mEq/L (ref 135–145)
Total Bilirubin: 0.9 mg/dL (ref 0.3–1.2)
Total Protein: 6.6 g/dL (ref 6.0–8.3)

## 2011-02-02 ENCOUNTER — Encounter: Payer: Self-pay | Admitting: Family Medicine

## 2011-02-02 NOTE — Assessment & Plan Note (Addendum)
Summary: Rt side abdominal pain    Vital Signs:  Patient profile:   60 year old female Height:      63 inches Weight:      139 pounds O2 Sat:      99 % on Room air Pulse rate:   56 / minute BP sitting:   108 / 67  (right arm) Cuff size:   regular  Vitals Entered By: Avon Gully CMA, (AAMA) (January 29, 2011 11:28 AM)  O2 Flow:  Room air CC: rt side pain since wed   Primary Care Provider:  Linford Arnold, C  CC:  rt side pain since wed.  History of Present Illness: rt side pain since wed.  Took tylenol and went to bed about an hour later. Was able to sleep through the night. Se did have some sharp pain in her bladder. Went to the [employer] then started having pain on her right side. It was severe.  Lasted about 10 minutes. Then later that night starting hurting again. Pain is intermittant. Right now feels like a pressure. Usually when has her IC pain is similar  but that is usually across her lower abdomen. No hx of kidney stones.  Not sure if a fever or not. Has had normal BMs. No worsening or alleviating sxs.  No exacerbated by food.   Current Medications (verified): 1)  Aspir-Low 81 Mg Tbec (Aspirin) .... Once Daily 2)  Xanax 0.25 Mg Tabs (Alprazolam) .... Two Times A Day As Needed 3)  One-A-Day Essential  Tabs (Multiple Vitamin) .... Take 1 Tablet By Mouth Once A Day 4)  Metoprolol Tartrate 25 Mg  Tabs (Metoprolol Tartrate) .... Take 1 Tablet By Mouth Two Times A Day  Allergies (verified): 1)  ! Ampicillin 2)  ! Codeine 3)  ! Biaxin 4)  ! * Flecanide 5)  ! * Nasonex-Ha 6)  ! Azithromycin (Azithromycin) 7)  ! Penicillin 8)  Keflex (Cephalexin)  Comments:  Nurse/Medical Assistant: The patient's medications and allergies were reviewed with the patient and were updated in the Medication and Allergy Lists. Avon Gully CMA, Duncan Dull) (January 29, 2011 11:30 AM)  Past History:  Past Surgical History: Last updated: 10/07/2010 c/sec  1971, 1973 Deviated septum   1977 Pulmonary Vein Isolation procedure-for A-fib on 8/07 at [facility]  Physical Exam  General:  Well-developed,well-nourished,in no acute distress; alert,appropriate and cooperative throughout examination Head:  Normocephalic and atraumatic without obvious abnormalities. No apparent alopecia or balding. Lungs:  Normal respiratory effort, chest expands symmetrically. Lungs are clear to auscultation, no crackles or wheezes. Heart:  Normal rate and regular rhythm. S1 and S2 normal without gallop, murmur, click, rub or other extra sounds. Abdomen:  soft.  Inc BS. Tender diffulsely but more tender on teh left.  Skin:  no rashes.   Cervical Nodes:  No lymphadenopathy noted Psych:  Cognition and judgment appear intact. Alert and cooperative with normal attention span and concentration. No apparent delusions, illusions, hallucinations   Impression & Recommendations:  Problem # 1:  FLANK PAIN, RIGHT (ICD-789.09) Unclear etiology. Will check urine to rule out cystitis.  consider renal stone.  Will check CBC as well to rule out acute infection. Will call with results later today. If pain suddently gets worse over the please call.   Her updated medication list for this problem includes:    Aspir-low 81 Mg Tbec (Aspirin) ..... Once daily  Orders: T-CBC w/Diff ([id number]) T-Comprehensive Metabolic Panel ([id number])  Complete Medication List: 1)  Aspir-low 81  Mg Tbec (Aspirin) .... Once daily 2)  Xanax 0.25 Mg Tabs (Alprazolam) .... Two times a day as needed 3)  One-a-day Essential Tabs (Multiple vitamin) .... Take 1 tablet by mouth once a day 4)  Metoprolol Tartrate 25 Mg Tabs (Metoprolol tartrate) .... Take 1 tablet by mouth two times a day  Patient Instructions: 1)  We will call you with the lab results.    Orders Added: 1)  T-CBC w/Diff [[id number]] 2)  T-Comprehensive Metabolic Panel [[id number]] 3)  Est. Patient Level IV [57846]  Appended Document: Rt side abdominal pain      Clinical Lists Changes  Medications: Added new medication of BACTRIM DS 800-160 MG TABS (SULFAMETHOXAZOLE-TRIMETHOPRIM) Take 1 tablet by mouth two times a day for 3 days - Signed Rx of BACTRIM DS 800-160 MG TABS (SULFAMETHOXAZOLE-TRIMETHOPRIM) Take 1 tablet by mouth two times a day for 3 days;  #6 x 0;  Signed;  Entered by: Nani Gasser MD;  Authorized by: Nani Gasser MD;  Method used: Electronically to [facility] [phone]*, [address] [place], [place]  [address], Ph: [phone], Fax: [phone] Orders: Added new Service order of UA Dipstick w/o Micro (automated)  ([id number]) - Signed Added new Test order of T-Urine Culture (Spectrum Order) [id number]) - Signed Observations: Added new observation of WBC DIPSTK U: negative (01/29/2011 15:44) Added new observation of NITRITE URN: negative (01/29/2011 15:44) Added new observation of UROBILINOGEN: 0.2  (01/29/2011 15:44) Added new observation of PROTEIN, URN: negative  (01/29/2011 15:44) Added new observation of PH URINE: 7.0  (01/29/2011 15:44) Added new observation of BLOOD UR DIP: trace-lysed  (01/29/2011 15:44) Added new observation of SPEC GR URIN: 1.010  (01/29/2011 15:44) Added new observation of KETONES URN: negative  (01/29/2011 15:44) Added new observation of BILIRUBIN UR: negative  (01/29/2011 15:44) Added new observation of GLUCOSE, URN: negative  (01/29/2011 15:44) Added new observation of APPEARANCE U: Clear  (01/29/2011 15:44) Added new observation of UA COLOR: yellow  (01/29/2011 15:44)    Prescriptions: BACTRIM DS 800-160 MG TABS (SULFAMETHOXAZOLE-TRIMETHOPRIM) Take 1 tablet by mouth two times a day for 3 days  #6 x 0   Entered and Authorized by:   Nani Gasser MD   Signed by:   Nani Gasser MD on 01/29/2011   Method used:   Electronically to        [facility] [phone]* (retail)       [address]       [place], [place]  [address]       Ph: [phone]       Fax:  [phone]   RxID:   [id number]    Laboratory Results   Urine Tests  Date/Time Received: 01/29/11 Date/Time Reported: 01/911  Routine Urinalysis   Color: yellow Appearance: Clear Glucose: negative   (Normal Range: Negative) Bilirubin: negative   (Normal Range: Negative) Ketone: negative   (Normal Range: Negative) Spec. Gravity: 1.010   (Normal Range: 1.003-1.035) Blood: trace-lysed   (Normal Range: Negative) pH: 7.0   (Normal Range: 5.0-8.0) Protein: negative   (Normal Range: Negative) Urobilinogen: 0.2   (Normal Range: 0-1) Nitrite: negative   (Normal Range: Negative) Leukocyte Esterace: negative   (Normal Range: Negative)      Call pt:Some trace blood in teh urine. Will send a cultre and sent in an ABX.  March  9, 20124:13 PM Metheney MD, [facility]   12:10 PM February 01, 2011 McCrimmon CMA, Duncan Dull), Sue Lush pt notified

## 2011-02-02 NOTE — Progress Notes (Signed)
Summary: CALL A NURSE   Phone Note Call from Patient   Caller: Call A Nurse Summary of Call: [facility] Triage Call Report Triage Record Num: [id number] Operator: Martie Lee Long Patient Name: Stephanie Rollins Call Date & Time: 01/27/2011 7:13:20PM Patient Phone: [phone] PCP: Nani Gasser Patient Gender: Female PCP Fax : [phone] Patient DOB: [birth year] Practice Name: [facility] Reason for Call: [name]/[name] calling about pain in her rt side. Onset 01/27/11. Temp 97.3(oral). Pt has had alittle nausea and has not ate much today. Pt locates pain to her lower rt side. Pt reports that pain is currently mild. All emergent sx r/o per [facility] Protocol. Initial call taken by: Payton Spark CMA,  January 28, 2011 8:26 AM

## 2011-02-22 ENCOUNTER — Other Ambulatory Visit: Payer: Self-pay | Admitting: *Deleted

## 2011-02-22 ENCOUNTER — Telehealth: Payer: Self-pay | Admitting: Cardiology

## 2011-02-22 MED ORDER — METOPROLOL TARTRATE 25 MG PO TABS: 25.0000 mg | ORAL_TABLET | Freq: Two times a day (BID) | ORAL | Status: DC

## 2011-02-22 NOTE — Telephone Encounter (Signed)
ALREADY DONE

## 2011-03-01 ENCOUNTER — Other Ambulatory Visit: Payer: Self-pay | Admitting: Family Medicine

## 2011-03-01 DIAGNOSIS — Z1239 Encounter for other screening for malignant neoplasm of breast: Secondary | ICD-10-CM

## 2011-03-03 ENCOUNTER — Ambulatory Visit (INDEPENDENT_AMBULATORY_CARE_PROVIDER_SITE_OTHER): Payer: Self-pay | Admitting: Family Medicine

## 2011-03-03 ENCOUNTER — Ambulatory Visit: Payer: Self-pay

## 2011-03-03 DIAGNOSIS — R1031 Right lower quadrant pain: Secondary | ICD-10-CM

## 2011-03-03 DIAGNOSIS — N390 Urinary tract infection, site not specified: Secondary | ICD-10-CM

## 2011-03-03 LAB — POCT URINALYSIS DIPSTICK
Bilirubin, UA: NEGATIVE
Glucose, UA: NEGATIVE
Ketones, UA: NEGATIVE
Nitrite, UA: NEGATIVE
Spec Grav, UA: >=1.03
pH, UA: 5

## 2011-03-03 NOTE — Assessment & Plan Note (Signed)
UA normal other than dehydration.  Symptoms may be due to her chronic interstitial cystitis.  No indication for antibiotics.  Will have Dr Linford Arnold follow.,

## 2011-03-03 NOTE — Progress Notes (Signed)
  Subjective:    Patient ID: Stephanie Rollins, female    DOB: March 15, 1951, 60 y.o.   MRN: [id number]  HPI    Review of Systems     Objective:   Physical Exam nn       Assessment & Plan:

## 2011-03-09 ENCOUNTER — Ambulatory Visit
Admission: RE | Admit: 2011-03-09 | Discharge: 2011-03-09 | Disposition: A | Discharge status: Home / Self Care | Payer: Self-pay | Source: Ambulatory Visit | Attending: Family Medicine | Admitting: Family Medicine

## 2011-03-09 DIAGNOSIS — Z1239 Encounter for other screening for malignant neoplasm of breast: Secondary | ICD-10-CM

## 2011-03-16 ENCOUNTER — Telehealth: Payer: Self-pay | Admitting: Family Medicine

## 2011-03-16 NOTE — Telephone Encounter (Signed)
Left message on vm

## 2011-03-16 NOTE — Telephone Encounter (Signed)
Call pt. Normal mammogram

## 2011-03-17 ENCOUNTER — Ambulatory Visit (INDEPENDENT_AMBULATORY_CARE_PROVIDER_SITE_OTHER): Payer: Self-pay | Admitting: Family Medicine

## 2011-03-17 VITALS — BP 121/68 | HR 67 | Ht 63.75 in | Wt 140.0 lb

## 2011-03-17 DIAGNOSIS — Z0000 Encounter for general adult medical examination without abnormal findings: Secondary | ICD-10-CM

## 2011-03-17 DIAGNOSIS — M79606 Pain in leg, unspecified: Secondary | ICD-10-CM

## 2011-03-17 DIAGNOSIS — Z23 Encounter for immunization: Secondary | ICD-10-CM

## 2011-03-17 MED ORDER — METOPROLOL TARTRATE 25 MG PO TABS: 25.0000 mg | ORAL_TABLET | Freq: Two times a day (BID) | ORAL | Status: DC

## 2011-03-17 NOTE — Progress Notes (Signed)
  Subjective:     Stephanie Rollins is a 60 y.o. female and is here for a comprehensive physical exam. The patient reports problems - Says her left leg gets extremely cold from the knee down esp at night. Says it can almost look blue. She has to stand in front of heater to warm it up. . She also feels her IC is flaring but doesn't have insurance right now to go back to Urology. She was recently treated for UTI and says that the symptoms do feel better. She is not getting any regular exercise because she if fearful of going into afib even though she has had an ablation adn even thought the cardiologist has told her she can exercise.  Seh has a treadmill at home but is not using it. As well is getting out in the yard and working in her garden.  History   Social History  . Marital Status: Divorced    Spouse Name: N/A    Number of Children: N/A  . Years of Education: N/A   Occupational History  . Not on file.   Social History Main Topics  . Smoking status: Not on file  . Smokeless tobacco: Not on file  . Alcohol Use: Not on file  . Drug Use: Not on file  . Sexually Active: Not on file   Other Topics Concern  . Not on file   Social History Narrative  . No narrative on file   Health Maintenance  Topic Date Due  . Pap Smear  06/09/1967  . Colonoscopy  06/09/1999  . Zostavax  06/08/2009  . Tetanus/tdap  11/22/2010  . Influenza Vaccine  08/23/2011  . Mammogram  03/08/2013    The following portions of the patient's history were reviewed and updated as appropriate: allergies, current medications, past family history, past medical history, past social history, past surgical history and problem list.  Review of Systems Pertinent items are noted in HPI.   Objective:    There were no vitals taken for this visit. General appearance: alert, cooperative and appears stated age Head: Normocephalic, without obvious abnormality, atraumatic Eyes: PEERLA, EOMI.  Ears: normal TM's and external ear  canals both ears Nose: no discharge Throat: abnormal findings: dentition: upper dentures Neck: no adenopathy, no carotid bruit, no JVD, supple, symmetrical, trachea midline and thyroid not enlarged, symmetric, no tenderness/mass/nodules Back: symmetric, no curvature. ROM normal. No CVA tenderness. Lungs: clear to auscultation bilaterally Breasts: normal appearance, no masses or tenderness Heart: regular rate and rhythm, S1, S2 normal, no murmur, click, rub or gallop Abdomen: soft, non-tender; bowel sounds normal; no masses,  no organomegaly Extremities: extremities normal, atraumatic, no cyanosis or edema Pulses: 2+ and symmetric in the upper and lower extremitites Skin: Skin color, texture, turgor normal. No rashes or lesions Lymph nodes: Cervical, supraclavicular, and axillary nodes normal. Neurologic: Grossly normal    Assessment:    Healthy female exam.    Plan:     See After Visit Summary for Counseling Recommendations   Due for screening labs today. Colonoscopy is up to date Due for her pap next year. Had normal mammogram last week.   IC- Let me know when would like referral to Urology.  Left leg feeling cold - will refer for arterial dopplers to look at blood flow but I suspect has to do more with over constriction of

## 2011-03-18 ENCOUNTER — Telehealth: Payer: Self-pay | Admitting: Family Medicine

## 2011-03-18 LAB — CBC
MCHC: 32.1 g/dL (ref 30.0–36.0)
Platelets: 217 10*3/uL (ref 150–400)
RDW: 13.3 % (ref 11.5–15.5)
WBC: 5.6 10*3/uL (ref 4.0–10.5)

## 2011-03-18 LAB — COMPLETE METABOLIC PANEL WITH GFR
ALT: 24 U/L (ref 0–35)
AST: 21 U/L (ref 0–37)
Alkaline Phosphatase: 62 U/L (ref 39–117)
Creat: 0.67 mg/dL (ref 0.40–1.20)
Sodium: 143 mEq/L (ref 135–145)
Total Bilirubin: 1 mg/dL (ref 0.3–1.2)
Total Protein: 7.2 g/dL (ref 6.0–8.3)

## 2011-03-18 LAB — LIPID PANEL
HDL: 47 mg/dL (ref >39)
LDL Cholesterol: 111 mg/dL — ABNORMAL HIGH (ref 0–99)
Total CHOL/HDL Ratio: 4.1 Ratio
Triglycerides: 171 mg/dL — ABNORMAL HIGH (ref <150)

## 2011-03-18 NOTE — Telephone Encounter (Signed)
Call pt: LDL looks better. TG are stil high. NOrmal blood count and complete metabolic panel. Thyroid is nl.

## 2011-03-22 ENCOUNTER — Telehealth: Payer: Self-pay | Admitting: *Deleted

## 2011-03-22 NOTE — Telephone Encounter (Signed)
Pt left message at 2:42pm stating she was having a reaction to her tetanus shot she received last week.  RC to pt.  I have attempted to contact this patient by phone with the following results: left message to return my call on answering machine.

## 2011-03-22 NOTE — Telephone Encounter (Signed)
Left message with results on pts home vm

## 2011-03-23 NOTE — Telephone Encounter (Signed)
1--RC from pt.  She states she received TDap on Wednesday and she began having hives on Saturday on arms and back.  Pt would like to know if injection has any preservatives that may have caused this.  Pt denies any new products (lotion, soap, detergent, etc.).  Pt denies having any problems with tetanus vaccine in the past.  2--pt has not heard from anyone regarding doppler.  Pt was under the impression this was to be an xray.  Pt needs to have the appt in [place] as she can not drive to [place].  3--pt requests a copy of her most recent labs to be mailed to her home address.  This is done.  Please advise regarding Tdap.

## 2011-03-23 NOTE — Telephone Encounter (Signed)
1. I don't know it the Tdap we use has preservatives. We can check the fridge. If doesn't say preservative-free then usually has preservatives  2. I definitely put in order for doppler. I explained we would get an [place] of her leg. Thank you for explaining that  To her.  Tell her I am sorry I didn't explain it more.  It is a vascular study so has to be done at a hospital. We can see if it can be sched at the [facility]. Please let Stephanie Rollins know to try to schedule her there.

## 2011-03-23 NOTE — Telephone Encounter (Signed)
Called patient to follow up about Tdap reaction.  LMOM for patient to call triage nurse to discuss if she was still having problems and had concerns or needed questions answered. Jarvis Newcomer, LPN [facility]

## 2011-04-06 NOTE — Assessment & Plan Note (Signed)
[facility]                            CARDIOLOGY OFFICE NOTE   NAME:Rollins, Stephanie BLACKIE                    MRN:          [id number]  DATE:10/04/2007                            DOB:          [birth year]    [name] is a very pleasant female who has a history of SVT and  atrial fibrillation.  She was previously followed by Dr. Graciela Husbands and  treated with amiodarone.  However, she was subsequently referred to  [facility] and underwent catheter ablation in August of 2007.  She  was seen back in December and was having intermittent, flutter-like  sensations.  A [employer] monitor was obtained and showed premature  ventricular contractions.  They did not feel that she was having  recurrent atrial fibrillation or flutter.  Note, she did have an  echocardiogram performed in February of 2008 that showed normal left  ventricular function.  There was trivial mitral regurgitation.  The  patient, apparently, has done well since March until August when she  started having recurrent palpitations.  She describes this as her heart  rate elevated.  Her heart will go up into the 104 range.  This can  last for 30 minutes, and she typically feels this at night.  Note, she  does not have significant dyspnea on exertion, orthopnea, PND, pedal  edema, syncope, or chest pain.  She does occasionally feel lightheaded  when going from the bending over position to the standing position.   Her medications at present include Singulair, Astelin, aspirin 81 mg  p.o. daily and multivitamin.   PHYSICAL EXAMINATION:  Shows a blood pressure of 136/75 and her pulse is  84.  She weighs 145 pounds.  She is well developed and well nourished in no acute distress.  She does  appear mildly anxious.  Her HEENT is normal.  Her neck is supple with no bruits.  Her chest is clear.  Her cardiovascular exam reveals a regular rate and rhythm.  Her abdominal exam shows no tenderness.  Her  extremities show no edema.   Her electrocardiogram shows a sinus rhythm at a rate of 81.  There are  minor, nonspecific ST changes.   DIAGNOSES:  1. Palpitations:  We will plan to proceed with an event monitor.  I      wonder whether she may be having recurrent atrial arrhythmias      versus the premature ventricular contractions that were found      previously on her [employer] monitor.  If she does, indeed,      have recurrent atrial fibrillation, then she may need to be      referred back to [facility] for a repeat ablation.  If it is      premature ventricular contractions, then we could consider a low-      dose beta blocker.  Note, she has had some difficulties with beta      blockers in the past, but we could try very low doses to see if it      improves her symptoms.  We will have her  return in 4-6 weeks to      review her monitor.  2. History of irritable bowel syndrome.  3. History of mitral valve prolapse:  Note, her recent echocardiogram      in February of this year showed normal left ventricular function      and trace mitral regurgitation.  There was no mitral valve      prolapse.  4. History of gastroesophageal reflux disease.   We will see her back as described above in 4-6 weeks.     Stephanie Frieze Jens Som, MD, [facility]  Electronically Signed    BSC/MedQ  DD: 10/04/2007  DT: 10/05/2007  Job #: [id number]

## 2011-04-06 NOTE — Assessment & Plan Note (Signed)
[facility]ALTHCARE                            CARDIOLOGY OFFICE NOTE   NAME:Stephanie Rollins, Stephanie Rollins                    MRN:          [id number]  DATE:12/13/2007                            DOB:          [birth year]    Stephanie Rollins is a very pleasant 60 year old female who has a history of  SVT as well as atrial fibrillation and mitral valve prolapse.  I had  initially met Stephanie Rollins on October 04, 2007.  At that time she was  complaining of flutter-like sensations.   NOTE:  Her LV function has been normal.  We did schedule her have a  cardiac monitor.  I do not have her strips available today but in  reviewing her summary, it appeared she predominantly had sinus rhythm  and sinus tachycardia.  There is one report of the atrial  fibrillation/atrial run.   NOTE:  The patient denies any significant dyspnea.  She occasionally  feels an indigestion but this is improved with burping.  She does not  have exertional chest pain.  She continues to have palpitations  described as a heart rate racing.  Her medications include Astelin  spray, aspirin 81 mg daily, and multivitamin daily.   PHYSICAL EXAM:  Today shows a blood pressure 141/76 and pulse of 98.  She was 140 pounds.  HEENT:  Normal.  NECK:  Supple.  CHEST:  Clear.  Cardiovascular is regular rhythm.  ABDOMEN:  Exam shows no tenderness.  EXTREMITIES:  Show no edema.   DIAGNOSIS:  1. Palpitations - we have the results of Stephanie Rollins monitor.  It      appears to be sinus rhythm and sinus tachycardia.  There is also      one report of atrial fibrillation but I do not have the exact      strips available.  It is clear that some of her palpitations      related to sinus rhythm.  We will review her strips later.      However, at this point I do not feel that she would benefit from      antiarrhythmic or consideration of ablation as most her      palpitations again are related to sinus rhythm and sinus  tachycardia.  We discussed possibility of a beta blocker but she      declined at present.  She wishes to review the strips herself and      we will make that available.  I will see her back in 3 months and      we will we discuss a beta blocker at that point.  2. History of irritable bowel syndrome.  3. History of mitral valve prolapse.  4. History of gastric reflux disease.     Madolyn Frieze Jens Som, MD, [facility]  Electronically Signed   BSC/MedQ  DD: 12/13/2007  DT: 12/13/2007  Job #: [id number]

## 2011-04-06 NOTE — Assessment & Plan Note (Signed)
[facility]                            CARDIOLOGY OFFICE NOTE   NAME:Stephanie Rollins, Stephanie Rollins                    MRN:          [id number]  DATE:03/06/2008                            DOB:          06/06/1951    Stephanie Rollins is 60 year old female who has a history of SVT, as well as  atrial fibrillation and mitral valve prolapse.  She had previous atrial  fibrillation ablation in August 2007, at [facility].  Note, her  most recent echocardiogram was on 01/09/2007.  Her LV function was  normal.  There was trivial mitral regurgitation.  When I last saw her,  she had, had a recent monitor which showed predominantly PACs and PVCs  to my recollection, but that is not available today.  We did place her  on Lopressor 12.5 mg p.o. b.i.d..  She states this did improve her  palpitations.  She does occasionally continue to feel skip and  flutter.  These are not sustained.  There is no dyspnea, chest pain or  syncope.  There is no pedal edema.   MEDICATIONS:  1. Aspirin 81 mg p.o. daily.  2. Multivitamin daily.  3. Lopressor 12.5 mg p.o. b.i.d.   PHYSICAL EXAMINATION:  VITAL SIGNS:  Today, shows a blood pressure of  121/65 and a pulse 78.  She weighs 140 pounds.  HEENT:  Normal.  NECK:  Supple.  No bruits.  CHEST:  Clear.  CARDIOVASCULAR:  Regular rate and rhythm.  ABDOMEN:  Shows no tenderness.  EXTREMITIES:  Show no edema.   Electrocardiogram shows a sinus rhythm at a rate of 75.  The axis is  normal.  There are nonspecific ST changes.   DIAGNOSES:  1. Palpitations - Stephanie Rollins' symptoms have improved on the      Lopressor.  Her pulse and blood pressure would tolerate higher      doses and we will increase her Lopressor to 25 mg p.o. b.i.d. to      see if she does indeed tolerate this and improve her palpitations.  2. History of irritable bowel syndrome.  3. History of mitral valve prolapse.  4. Gastroesophageal reflux disease.   We will see back in  6 months.     Madolyn Frieze Jens Som, MD, [facility]  Electronically Signed    BSC/MedQ  DD: 03/06/2008  DT: 03/06/2008  Job #: [id number]

## 2011-04-06 NOTE — Assessment & Plan Note (Signed)
[facility]ALTHCARE                            CARDIOLOGY OFFICE NOTE   NAME:Rollins, Stephanie FREDMAN                    MRN:          [id number]  DATE:09/18/2008                            DOB:          [birth year]    Stephanie Rollins is a 60 year old female who has a history of SVT as well as  atrial fibrillation and mitral valve prolapse.  She has had a previous  ablation of her atrial fibrillation in August 2007 at the [facility].  Her LV function is preserved.  Since I last saw her, she is  doing reasonable.  She continues to have occasional palpitations  described as a brief flutter in her chest.  These are not sustained.  She is not having significant dyspnea on exertion, orthopnea, PND, pedal  edema, exertional chest pain, or syncope.  She occasionally feels a  sharp pain in her chest for 1 second but it is not exertional.   MEDICATIONS:  1. Aspirin 81 mg p.o. daily.  2. Multivitamin daily.  3. Lopressor 25 mg p.o. b.i.d.  4. She takes Xanax as needed.   PHYSICAL EXAMINATION:  Blood pressure 117/75 and pulse is 71.  She  weighs 144 pounds.  Her HEENT is normal.  Her neck is supple.  Her chest  is clear.  Cardiovascular reveals regular rate and rhythm.  Abdominal  exam shows no tenderness.  Extremities show no edema.   Her electrocardiogram shows a sinus rhythm at a rate of 74.  The axis is  normal.  There are nonspecific ST changes.   DIAGNOSES:  1. Palpitations - Mrs. Leccese' symptoms appear to be reasonably well      controlled on Lopressor 25 mg p.o. b.i.d.  She will continue this.  2. History of atrial fibrillation ablation - there is no evidence of      recurrence at this point.  She will continue on her baby aspirin.  3. History of irritable bowel syndrome.  4. History of mitral valve prolapse.  5. Gastroesophageal reflux disease.   We will see her back in 1 year.     Madolyn Frieze Jens Som, MD, [facility]  Electronically Signed    BSC/MedQ   DD: 09/18/2008  DT: 09/19/2008  Job #: [id number]

## 2011-04-06 NOTE — Letter (Signed)
February 10, 2009    Madolyn Frieze. Jens Som, MD, [facility]  [address]  [address]  [place], [place] [address]   RE:  Stephanie, Rollins  MRN:  [id number]  /  DOB:  03/11/1951   Dear Arlys John:   It was a pleasure to see Stephanie Rollins at your request.  As you  recall, she is a woman that I knew sometime ago and cared for her atrial  fibrillation.  She was ultimately referred to [facility] where she underwent  pulmonary evaluation in August 2007, and since that time, there is no  document for atrial fibrillation.  She has had palpitations, which you  have very acutely and diagnosed as PVCs.  We have treated her with  increasing dose of metoprolol, which have rendered her decreasingly  asymptomatic.  A large part of her dyspnea has resolved, her chest pain  has resolved, and the indigestion and gas feeling that was accompanying  recumbency and was associated with fluttering, has not improved with Dr.  Linford Arnold.  This is probably why Dr. Linford Arnold put her on Protonix.  She  is in large part much better.   She underwent Myoview scanning in January 2007 that was normal.  She had  an echo done, it is about 2 years ago that was normal.  She is actually  coming in asking for another echo.   Her cardiac risk factors negative for cigarettes hypertension and  diabetes  in the family.   CURRENT MEDICATIONS:  Aspirin, Xanax, metoprolol tartrate 25 twice a day  Daptazole, Astelin.   ALLERGIES:  She is allergic to AMPICILLIN, CODEINE, BIAXIN, FLECAINIDE  ERYTHROMYCIN, and NASONEX.   PHYSICAL EXAMINATION:  VITAL SIGNS:  Her blood pressure is 136/80, her  pulse is 72, her weight is 137, BMI of 24.  HEENT:  No icterus or xanthoma.  Her neck veins were flat.  Her carotids  were brisk and full bilaterally without bruits.  BACK:  Without kyphosis or scoliosis.  LUNGS:  Clear.  HEART:  Sounds were regular without S4.  ABDOMEN:  Soft with active bowel sounds without midline pulsation or  hepatomegaly.  EXTREMITIES:   Femoral pulses were 2+.  Distal pulses were intact.  There  is no clubbing, cyanosis, or edema.  NEUROLOGIC:  Grossly normal.  SKIN:  Warm and dry.   Electrocardiogram that Dr. Linford Arnold sent so we did not have to repeat it  and  it was reviewed on January 09, 2009, demonstrated sinus rhythm at  about 58 beats per minute.  The intervals were 0.12/0.09/0.43.   IMPRESSION:  1. History of atrial fibrillation status post pulmonary manipulation      without recurrence of atrial fibrillation.  2. Recurrent palpitations with PVCs documented with symptoms much      improved on her beta-blocker.  3. Reflux disease associated with fluttering also improved briefly      with the addition of PPI drug.  4. Normal left ventricular function, nonischemic Myoview.   Stephanie Rollins, Stephanie Rollins is really very well.  I commend the appropriate  therapy that you and Dr. Linford Arnold have pursued.  I have little to add.   If there is anything further I can do, please do not hesitate to contact  me.    Sincerely,      Duke Salvia, MD, [facility]  Electronically Signed    SCK/MedQ  DD: 02/10/2009  DT: 02/11/2009  Job #: [id number]

## 2011-04-07 ENCOUNTER — Telehealth: Payer: Self-pay | Admitting: Family Medicine

## 2011-04-07 NOTE — Telephone Encounter (Signed)
Called the patient to discuss her call earlier today.  Told her I will follow up with Victorino Dike Rich/referrals about the U/s of leg we were to schedule a couple of weeks ago.  Also, will document that pt  Was instructed that our Dtap does not contain preservatives, however pt experienced a rash(hives, and are on back not related to where Dtap was given by our office, but she did have knot, redness at injection site.  The hives appeared 3 days after our office had given the shot.  Pt also stated she had been itching in those areas prior to taking the Dtap at our office.  Told pt knot, redness, swelling are all common side effects of getting the shot(Dtap).  TOld cold compresses first 24-48 hours, then heat after that and that knot will eventually go away.  Told pt I will document so if she gets the shot in the future that we'll have record of it.  Pt hives disepated after two days of complaining with it.  No futher problems.

## 2011-04-07 NOTE — Telephone Encounter (Signed)
Pt called and spoke with Judeth Cornfield a couple of weeks ago and was suppose to get a call back regarding advice about her tetanus shot she received and ? Reaction, and about a referral for doppler study that was suppose to be set up. Please advise... Plan: Routed to Dr. Linford Arnold and Avon Gully, CMA

## 2011-04-09 NOTE — H&P (Signed)
NAME:  Stephanie Rollins, Stephanie Rollins NO.:  [id number]   MEDICAL RECORD NO.:  [id number]          PATIENT TYPE:  INP   LOCATION:  6527                         FACILITY:  [facility]   PHYSICIAN:  Doylene Canning. Ladona Ridgel, M.D.  DATE OF BIRTH:  04/27/1951   DATE OF ADMISSION:  01/04/2006  DATE OF DISCHARGE:                                HISTORY & PHYSICAL   ADMISSION DIAGNOSIS:  Symptoms atrial flutter.   HISTORY OF PRESENT ILLNESS:  The patient is a 60 year old woman with a  history of a predominance of atrial fibrillation in the past, although she  has had some atrial flutter, who was placed initially on flecainide and  Toprol.  Unfortunately, she developed hallucinations on flecainide and was  admitted to the hospital back in January with symptomatic atrial flutter.  At that time, she was placed on Rythmol and did relatively well until she  called the office yesterday with a one-day history of recurrent tachy  palpitations.  She had her dose of Rythmol increased and her dose of beta  blocker increased.  Subsequently today she was initially better but then  developed recurrence of her symptoms and presented to the emergency room  where she was in atrial flutter with variable but typically 2:1 A-V  conduction.  She has been admitted for additional evaluation.   PAST MEDICAL HISTORY:  Notable for:  1.  Nonobstructive coronary artery disease by catheterization.  2.  She had hallucinations on FLECAINIDE.  3.  History of atrial fibrillation and flutter in the past.  4.  She has chronic dyspnea.  5.  She had normal thyroid in the past.   FAMILY HISTORY:  Notable for mother dying of renal failure in her 11s, and  father dying of complications of an MI in his 31s.  She has one sibling who  died at 82 of an MI.   REVIEW OF SYSTEMS:  Negative for fevers, chills, night sweats, headache,  vision or hearing problems.  She denies skin rashes or lesions.  She does  have chronic dyspnea but no chest  pain, PND, or orthopnea.  No cough or  wheezing.  She denies dysuria, hematuria, and nocturia. She denies weakness,  numbness, or depression.  Arthralgias and arthritis not reported.  She  denies nausea, vomiting, diarrhea, or constipation.  She does have  gastroesophageal reflux symptoms.  She denies polyuria or polydipsia, heat  or cold intolerance.  Neurologic Review of Systems was negative as noted  above.  The rest of her Review of Systems was negative.   PHYSICAL EXAMINATION:  GENERAL:  She is a pleasant, well-appearing, middle-  age woman in no distress.  VITAL SIGNS:  Blood pressure today was 117/75, pulse 112 and irregularly  irregular, respirations 16, temperature 96.  HEENT:  Normocephalic and atraumatic.  Pupils equal and round.  Oropharynx  moist.  Sclerae anicteric.  NECK:  No jugular venous distention, no thyromegaly, trachea midline.  CARDIOVASCULAR: Irregularly irregular rhythm, normal S1 and S2.  There are  no murmurs, rubs, or gallops.  LUNGS: Clear bilaterally to auscultation.  No wheezes, rales, or rhonchi.  There  is no appreciable extra work of breathing.  ABDOMEN: Soft, nontender, nondistended.  No organomegaly.  Bowel sounds  present.  EXTREMITIES:  Demonstrate no cyanosis, clubbing, or edema.  NEUROLOGIC:  Alert and oriented x3.  Cranial nerves intact.  Strength 5/5  and symmetric.   EKG demonstrates atrial flutter with variable A-V conduction.   IMPRESSION:  1.  Paroxysmal atrial fibrillation.  2.  Paroxysmal atrial flutter.  3.  Borderline dyslipidemia.   DISCUSSION:  Ms. Dismore has symptomatic atrial flutter which has been  persistent for at least a day and one-half, perhaps longer.  I have  recommended she be admitted to the hospital and treated initially with rate  control.  Consideration for TE-guided flutter ablation will be carried out  in the next 48 hours.           ______________________________  Doylene Canning. Ladona Ridgel, M.D.     GWT/MEDQ  D:   01/04/2006  T:  01/04/2006  Job:  [id number]   cc:   Meade Maw, M.D.  Fax: [phone]

## 2011-04-09 NOTE — H&P (Signed)
[place]  Patient:    Stephanie Rollins, Stephanie Rollins Visit Number: [id number] MRN: [id number]          Service Type: MED Location: [id number] Attending Physician:  Meade Maw A Dictated by:   Anselm Lis, N.P. Admit Date:  07/21/2001   CC:         Darius Bump, M.D.   History and Physical  PRIMARY CARE CARDIOLOGIST: Meade Maw, M.D.  PRIMARY CARE DOCTOR: Darius Bump, M.D.  DATE OF BIRTH: [birth year]  IMPRESSION (as dictated by Dr. Verdis Prime):  1. Chest pain with atypical/typical features in this 60 year old female, who     is status post stress Cardiolite in July 2002 which was suspicious for     ischemia in the region of the anterior apical wall.  Ejection fraction was     56%.  She did have post exercise (recovery) ST-T wave abnormalities.  The     patient deferred cardiac catheterization at that time.     a. July 12, 2001, 2D echocardiogram revealing mild MR, without MVP.        Normal chambers, with ejection fraction 55%.  Currently the patient is        pain-free, her electrocardiogram is nonischemic, having slight ST        depressions inferiorly, but these are unchanged from baseline.  First        set of cardiac enzymes negative.  Chest x-ray negative for        congestive heart failure.  2. History of irritable bowel syndrome (remote).  3. History of cystitis, continued urinary frequency from small bladder.  PLAN (as dictated by Dr. Verdis Prime): Admit to 23 hour observation with serial cardiac enzymes and daily EKG.  Anticipate cardiac catheterization either in versus outpatient when Dr. Fraser Din returns.  HISTORY OF PRESENT ILLNESS: Stephanie Rollins is a pleasant 60 year old female, with a positive family history of CAD, who noted shortness of breath ambulating up stairs to work this morning at approximately 9:30 a.m., and while conversing developed a left anterior chest stabbing discomfort, and then some  left anterior chest pressure and associated throat choking and left neck tightness. She felt shaky and noted her left hand was sweating.  Positive shortness of breath but without nausea.  The stabbing discomfort was rated 8/10 on pain scale.  The stabbing discomfort was episodic but relieved by the time she got to the ER.  She does have some persistent left anterior chest pressure which seems to wax and wane.  She has had prior spurts of the left anterior chest stabbing, which would radiate through to her back.  She has noted symptoms of GERD recently.  ALLERGIES:  1. AMPICILLIN, causing heart rate to race.  2. CODEINE, causing heart rate to race.  3. BIAXIN, causing diarrhea.  4. IMDUR, causing headache and nausea.  She is okay with seafood, shellfish, and iodinated products.  She does have emesis after eating shrimp.  MEDICATIONS:  1. Baby aspirin q.d.  2. Generic Sudafed.  3. Multivitamin.  4. Ibuprofen p.r.n.  5. She has been started on Imdur 30 mg by Dr. Fraser Din but had to stop after     three days because of intense headache.  SOCIAL HISTORY: Tobacco, negative,  EtOH, negative.  Caffeine, not excessive. 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  She is a retired Transport planner.  The patient is divorced and has one son, age  61, a daughter age 67, alive and well.  FAMILY HISTORY: Her mother died her 85s of uremic poisoning in the setting of pregnancy.  Her father died at age 63.  She has three sister alive and well, though one has had recent SVT and panic attacks.  One brother, age 44, alive and well.  One brother died at age 24 and had cardiac disease, first MI at age 68.  He was a heavy tobacco smoker.  He died of an MI at age 36.  One brother is age 84 and diagnosed about a year earlier with inoperable brain tumor.  He does have hypertension.  One stepbrother died at age 28 in the setting of brain tumor.  No CAD.  REVIEW OF SYSTEMS: This  morning she felt prescyncopal.  Otherwise, as per HPI and Past Medical History.  Episodic dysphagia with large pills.  Dentition okay.  Denies melena.  No bright red blood per rectum.  Negative for constipation or diarrhea.  She does have some urinary frequency, thought secondary to small bladder.  She has no arthritic symptoms.  Denies pedal edema, orthopnea, or PND.  She does note fatigue in the past, though this is improved with prayer.  Symptoms of GERD with reflux recently noted.  PHYSICAL EXAMINATION (as dictated by Dr. Verdis Prime):  VITAL SIGNS: Blood pressure 148/68, heart rate 76 and regular, respiratory rate 18.  O2 saturation 100% on room air.  TEMP 98.2 degrees.  GENERAL: She is a slender, pleasant, conversant 60 year old female, in no apparent distress.  She does continue with episodic spasms of left anterior chest tightness, transient.  HEENT/NECK: Bilateral carotid upstrokes without bruits.  No significant JVD or thyromegaly.  CHEST: Lung sounds with scant bibasilar crackles, otherwise clear.  CARDIAC: She has a positive S4, soft early systolic murmur.  Regular rate and rhythm.  ABDOMEN: Soft, nondistended, normoactive bowel sounds.  Negative abdominal aorta, renal, or femoral bruits.  Some abdominal discomfort to applied pressure.  No masses, no organomegaly appreciated.  EXTREMITIES: Distal pulses intact.  Negative pedal edema.  NEUROLOGIC: Cranial nerves 2-12 grossly intact.  Alert and oriented x 3.  GU/RECTAL: Examination deferred.  LABORATORY DATA: Hemoglobin 14.4, hematocrit 40.8, WBC 6.3, platelets 299,000. Sodium 142, K 3.9, chloride 105, CO2 28, BUN 11, creatinine 0.7, glucose 103. LFTs within normal range.  PT 13.6, INR 1, PTT 28.  Chest x-ray revealed no active disease.  EKG revealed NSR with mild (less than 0.5 mm) depression in inferior leads, but without change from baseline. Dictated by:   Anselm Lis, N.P. Attending Physician:  Mora Appl DD:  07/21/01 TD:  07/21/01  Job: [id number]

## 2011-04-09 NOTE — Discharge Summary (Signed)
NAMEDORTHA, NEIGHBORS NO.:  [id number]   MEDICAL RECORD NO.:  [id number]          PATIENT TYPE:  INP   LOCATION:  4737                         FACILITY:  [facility]   PHYSICIAN:  Meade Maw, M.D.    DATE OF BIRTH:  04-22-1951   DATE OF ADMISSION:  12/06/2005  DATE OF DISCHARGE:  12/10/2005                                 DISCHARGE SUMMARY   DISCHARGE DIAGNOSES:  1.  Paroxysmal atrial fibrillation with rapid ventricular response,      currently normal sinus rhythm with drug therapy.  2.  Abnormal coronary arteries by catheterization in February 2002 with      normal ejection fraction.  3.  Mitral valve prolapse.  4.  Hearing loss.  5.  Seasonal allergies.  6.  Multiple drug allergies to include AMPICILLIN, BIAXIN, CODEINE,      FLECAINIDE, and NITROGLYCERIN.  7.  Family history of coronary artery disease.   Ms. Calles is a 60 year old female who on December 06, 2005,  had onset of  SVT with increased syncope at noon.  In the emergency room she was started  on Diltiazem and her heart rate decreased into the 70s. She has had known  PAF and was unable to tolerate Flecainide in the past secondary to  hallucinations. Ultimately we obtained an EP consult from Dr. Sherryl Manges  who apparently knew the patient from the past. It was his suggestion that we  start the patient on propafenone and she did revert to normal sinus rhythm.  It was felt that if she did have further breakthrough in the future that she  may need ablation therapy.   Of note the patient did have a Cardiolite during this hospitalization that  showed normal myocardial perfusion with an EF of 70%. The patient was  discharged to home on December 10, 2005, in stable condition on the following  medications: Toprol XL 25 mg daily, propafenone 225 mg one tablet q.8h. She  is to follow up with Dr. Meade Maw in two weeks and to call for that  appointment. She also has a scheduled stress test in two weeks.  Her EKG,  once she was back to sinus rhythm, on December 08, 2005, showed a ventricular  rate of 87, normal sinus rhythm, no acute ST-T wave changes with a QTC of  45.  Other labwork included hemoglobin of 13.3, hematocrit 38.6, white  count 7.6, platelet count 247,000. BUN 11, creatinine 0.6. Cardiac  isoenzymes negative. Total cholesterol 178, triglycerides 117, LDL 113, HDL  42. TSH 3.244.   The patient is discharged to home in stable condition.      Guy Franco, P.A.      Meade Maw, M.D.  Electronically Signed    LB/MEDQ  D:  01/27/2006  T:  01/27/2006  Job:  [id number]   cc:   Duke Salvia, M.D.  [address]  [address]  [place]  [place] [address]

## 2011-04-09 NOTE — Discharge Summary (Signed)
NAMEKRISHA, [name] NO.:  [id number]   MEDICAL RECORD NO.:  [id number]          PATIENT TYPE:  INP   LOCATION:  4729                         FACILITY:  [facility]   PHYSICIAN:  Meade Maw, M.D.    DATE OF BIRTH:  [birth year]   DATE OF ADMISSION:  09/06/2005  DATE OF DISCHARGE:  09/11/2005                                 DISCHARGE SUMMARY   PRIMARY CARE PHYSICIAN:  Dr. Clare Charon.   DISCHARGING PHYSICIAN:  Dr. Deborah Chalk for Dr. Fraser Din.   CONSULTANT:  Dr. Ladona Ridgel with Electrophysiology.   CHIEF COMPLAINT AND REASON FOR ADMISSION:  Ms. Stephanie Rollins is a 60 year old  female patient with prior history of being evaluated by Dr. Fraser Din for  chest pain, who presented to the ER with new syncope and tachyarrhythmias,  symptomatic with pressure in the chest, shortness of breath and heart rates  averaging in the 170s; these symptoms began at work while she was sitting  still and waxed and waned until she presented to the ER.  By the time she  arrived at the ER, she had converted to sinus rhythm.  She has been  catheterized in the past and this demonstrated normal coronaries and normal  LV function.  She has been subsequently admitted for observation and  initiation of Toprol for rate control.  If no tachyarrhythmias pan out,  plans were to discharge the patient after 24 hours of observation and have  her follow up with the office with an echo for mitral valve prolapse.   ADMITTING DIAGNOSES:  1.  Tachycardia and palpitations, symptomatic, rule out underlying      arrhythmia.  2.  Shortness of breath secondary to tachycardia and palpitations, rule out      anxiety.  3.  Apparent history of mitral valve prolapse.   HOSPITAL COURSE:  PROBLEM #1 - TACHYCARDIA AND PALPITATIONS:  The patient  was admitted to the telemetry unit, where she was started on routine  telemetry surveillance.  Cardiac isoenzymes were checked; these were within  normal limits.  Within the first 24  hours, the patient had no evidence of  tachyarrhythmias, but was complaining of significant problems related to  sore throat.  On exam, she had palpable lymph nodes.  Her tympanic membranes  were retracted and she had erythema of the oropharynx and she had spiked a  temperature of 102, so we consulted Medicine Services for assistance with  this patient at this point.  Due to the patient's upper respiratory  symptoms, we felt her chest pain was atypical and probably costochondritis  in nature; ibuprofen was initiated for costochondritis.   Because of the upper respiratory symptoms, CTs of the sinuses were ordered;  these were negative, so at this point, Medicine felt this was a viral  pharyngitis; they had empirically started her on Avelox to treat sinusitis  or bacterial infection.   By the early morning hours of September 08, 2005, the patient had a documented  run of SVT; the patient was symptomatic with shortness of breath and chest  pressure.  She was having low-grade temperature at 100.8 at that time.  Her  BP was 100/58, heart rate initially at 85.  We went up to go to the bathroom  and her heart rate increased to the 170s; this continued for about 40  minutes.  Heart rate eventually went up into the 200s.  The patient  continued to complain of shortness of breath.  A rapid response was called  to the room.  During this time, Dr. Waldon Reining had been notified and at this  point, since the patient's irregular heart beat and rapid beat were not  resolving independently with rest, a Cardizem bolus was given and the  patient was started on a Cardizem drip.   The following morning, Dr. Fraser Din evaluated the patient, noting that she  had SVT, rates as high as 220; she felt that these were demonstrable with  atrial flutter with variable block, very symptomatic.  She recommended that  Electrophysiology follow the patient.   Subsequently, Dr. Ladona Ridgel with Electrophysiology evaluated the  patient.  There was consideration for proceeding with a study in the EP lab and  possibly proceeding with radiofrequency ablation if determined to be atrial  flutter versus treating with medications alone.  Because of the irregular  rhythm, they recommended starting IV heparin and keeping potassium greater  than 4.   The patient continued to have intermittent bouts of atrial flutter with  ventricular rates in the 110s to 120s.  Most of the time, she was  maintaining sinus rhythm.  In regards to her pharyngitis, her fever  continued to resolve.   EP makes note on September 09, 2005 that the patient had multiple SVTs,  flutter-like, as well as episodic fibrillation, as well as a regular SVT.  They did not recommend initiation with Coumadin anticoagulation due to no  thromboembolic risk factors.  Because of the multiple arrhythmias, they felt  that the best course of treatment at this point was to proceed with control  of rhythm with drugs, so flecainide was initiated.  Toprol was increased to  100 mg daily with recommendations to wean the Cardizem drip to off.  If  medication therapy was not successful, they recommended bringing the patient  back at some point for a radiofrequency ablation.  The patient then was  started on flecainide per EP doctors on September 09, 2005.  The following day  it was noted she had frequent PACs and wide complex beats over last 24  hours, and continued having intermittent episodes of atrial flutter/atrial  fib and  irregular SVT as previously noted.  They recommended watching the  patient on telemetry for an additional 24 hours.  They discontinued the  Cardizem.   By September 11, 2005, the patient was stable.  She had no further episodes of  extreme tachycardia.  She remained in mostly sinus rhythm with intermittent  episodic atrial fib-flutter and SVT, rate controlled.  She was sinus rhythm on date of discharge with a BP of 94/63, potassium 3.9, BUN 10 and   creatinine 0.9, low-grade temperature at 99 and because of the pharyngitis,  she was continued on Avelox at discharge.   FINAL DISCHARGE DIAGNOSES:  1.  Multiple supraventricular tachycardia, atrial flutter, atrial      fibrillation and atrial tachycardia.  2.  New flecainide initiation per Dr. Ladona Ridgel.  3.  Not a Coumadin candidate secondary to no thromboembolic risk factors.  4.  Viral pharyngitis.   DISCHARGE MEDICATIONS:  1.  Flecainide 50 mg twice daily.  2.  Avelox 400 mg daily for the next  3 days.  3.  Nasonex spray.  4.  Claritin 10 mg daily.  5.  Toprol-XL 100 mg daily.  6.  Mucinex 600 mg twice daily while you are taking the Avelox.   DIET:  Heart-healthy.   ACTIVITY:  Increase activity slowly.   ADDITIONAL INSTRUCTIONS:  You will be called from Dr. Lindell Spar office  regarding an outpatient stress test.   FOLLOWUP APPOINTMENT:  You are to have an EKG done at Dr. Lindell Spar office,  September 17, 2005, at 10:00.  You are to see Dr. Fraser Din on September 24, 2005  at 9 a.m.      Allison L. Joretta Bachelor, M.D.  Electronically Signed    ALE/MEDQ  D:  11/01/2005  T:  11/02/2005  Job:  [id number]   cc:   Clare Charon, M.D.  Fax: [phone]

## 2011-04-09 NOTE — H&P (Signed)
NAME:  Stephanie Rollins, Stephanie Rollins NO.:  [id number]   MEDICAL RECORD NO.:  [id number]          PATIENT TYPE:  EMS   LOCATION:  MAJO                         FACILITY:  [facility]   PHYSICIAN:  Duke Salvia, M.D.  DATE OF BIRTH:  1951/09/11   DATE OF ADMISSION:  01/14/2006  DATE OF DISCHARGE:                                HISTORY & PHYSICAL   PRIMARY CAREGIVER:  Dr. Alicia Amel   CARDIOLOGIST:  Dr. Fraser Din   ELECTROPHYSIOLOGIST:  Dr. Graciela Husbands   PRESENTING CIRCUMSTANCE:  I went into fast flutter after I spent all night  awake with a toothache.   HISTORY OF PRESENT ILLNESS:  Stephanie Rollins is a 60 year old female. She has a  history of atrial fibrillation/flutter since October 2006. She has symptoms.  She feels her heart racing. With fibrillation she feels her heart  explosively racing, like my heart is coming out of my chest. She started  flecainide October 2006 which kept her in sinus rhythm but she had  hallucinations with it and it was discontinued. She was off antiarrhythmics  from, say, December 9 until January 14, when Rythmol was started for  recurrent atrial tachyarrhythmias, which she failed this therapy even with a  one-time increase in the dose of Rythmol.   She was admitted January 04, 2006, taken off Rythmol, and started on  amiodarone for recurrent symptomatic atrial flutter. Her symptoms were  palpitation and nervousness. She has been mainly okay since discharge on  February 17 until this morning, about 5 a.m.   The patient came home from work yesterday. Her lower incisors were aching,  her jaw was swollen. She was unable to sleep all night. Also, at 5 a.m. this  morning, her heart erupted into a rapid heart rate which she was able to  catch on her blood pressure monitor at 127 beats a minute. She called the  office and was instructed to take her amiodarone which is 400 mg in the  morning, and Toprol-XL 25 mg early. Blood pressure at that time was 163 and  101.  After her medication, her heart rate subsided to 119. She re-called the  office and it was suggested that she come to the emergency room. With the  dysrhythmias this morning she did not get to the point where she felt she  would pass out as she has done in the past.   These are the concerns from the patient:  1.  Recurrent atrial flutter on her third antiarrhythmic. She wonders if the      therapy will continue to be effective.  2.  Incipient abscess, lower incisors, which has spread to the soft tissues      of the chin.  3.  Anxiety which is related to:      1.  Heart rhythm, will it return.      2.  Work issues. The patient is missing a lot of work with recent          hospitalizations.      3.  Dental, will I go into atrial fibrillation/flutter when the dentist  puts me out for a procedure.      4.  Swallow. She continues to have difficulty swallowing. A full GI          evaluation last visit in February was put off secondary to atrial          fibrillation/flutter issues. She was planned for an EGD and          colonoscopy which were postponed and these issues will be taken up          as an outpatient.      5.  The patient has mitral valve prolapse and has need for antibiotic          prophylaxis before any dental work.      6.  Dental visit today interrupted by atrial flutter problem.   Electrocardiogram in the emergency room shows the patient is in sinus rhythm  with a rate of 67.   SOCIAL HISTORY:  The patient is divorced, lives in [place] with a  daughter and her two children. She is currently working, struggling to keep  things going. 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 She does not smoke, does not take alcoholic beverages, nor use  drugs.   FAMILY HISTORY:  Mother died of complications of renal failure in her 53s.  Father died of complications of a myocardial infarction in his 88s.   PAST MEDICAL HISTORY:  1.  Resistant  recurrent atrial fibrillation/flutter, now on amiodarone 400      mg twice daily and Toprol-XL 25 mg daily.  2.  Active dental abscess for the last 36 hours.  3.  Swallow dysfunction, awaiting EGD/colonoscopy.  4.  Possible hypokalemia; will get a BMET.  5.  Dyslipidemia.  6.  Mitral valve prolapse.  7.  Nonobstructive coronary artery disease by catheterization September 2;      ejection fraction 65%.  8.  Remote history of irritable bowel syndrome.   The plan is to check her BMET here in the emergency room. I would favor  Xanax, though the patient is somewhat resistant. Also, would favor  increasing Toprol-XL to 50 mg daily to prevent relapse, and for her to  certainly continue amiodarone. She is due to step-down to amiodarone 400 mg  daily tomorrow. This is another of her anxiety issues and she should not be  anxious. I tried to reassure her that amiodarone is loading on a successive  basis. I will try to get Dr. Odessa Fleming impression of all this before she  discharges. She has been seen by the emergency room physicians who have done  quite a bit of the work in Warehouse manager practitioners to advise, and she  has a prescription for clindamycin and a follow-up appointment with dental  practitioners.      Maple Mirza, P.A.    ______________________________  Duke Salvia, M.D.    GM/MEDQ  D:  01/14/2006  T:  01/14/2006  Job:  [id number]

## 2011-04-09 NOTE — Discharge Summary (Signed)
NAME:  Stephanie Rollins, Stephanie Rollins NO.:  [id number]   MEDICAL RECORD NO.:  [id number]          PATIENT TYPE:  INP   LOCATION:  6527                         FACILITY:  [facility]   PHYSICIAN:  Duke Salvia, M.D.  DATE OF BIRTH:  August 08, 1951   DATE OF ADMISSION:  01/04/2006  DATE OF DISCHARGE:  01/08/2006                           DISCHARGE SUMMARY - REFERRING   DISCHARGE DIAGNOSES:  1.  Recurrent atrial flutter with intolerance to flecainide and failed      Rythmol.  2.  Hypokalemia with potassium supplementation.  3.  Amiodarone loading.  4.  Hyperlipidemia.  5.  History as noted above.   SUMMARY OF HISTORY:  Stephanie Rollins is a 60 year old white female who has  developed recurrent tachypalpitations, thus her presentation to the  emergency room, where she was noted to be in atrial flutter with a 2:1 AV  conduction.  She was admitted for further evaluation.   Her history is notable for atrial flutter and was placed on flecainide and  Toprol; however, she developed hallucinations and flecainide had to be  discontinued.  At that time she was placed on Rythmol in January in hopes to  control her palpitations.  Her history is also notable for nonobstructive  coronary artery disease, chronic dyspnea, borderline hyperlipidemia.   LABORATORY DATA:  Chest x-ray on February 13 showed cardiomegaly, COPD.  Admission H&H was 14.9 and 43.7, normal indices, platelets 351, WBC of 9.3.  Subsequent hematologies were unremarkable.  Admission PTT was 23, PT 14.3,  INR 1.1.  Initially she was placed on Coumadin with an INR level on the 17th  of 2.0.  Admission sodium was 139, potassium 3.7, BUN 11, creatinine 1.0,  glucose 112.  Normal LFTs.  Subsequent potassium on the 16th showed a  potassium of 4.1.  Fasting lipids on the 14th showed total cholesterol 249,  triglycerides 248, HDL 13, LDL 186.  EKGs on admission showed atrial  fibrillation with a lot of baseline artifact.  Subsequent EKG showed  atrial  flutter, atrial fibrillation.   HOSPITAL COURSE:  Stephanie Rollins was admitted to [facility] by Gene  Serpe, P.A.-C, and Doylene Canning. Ladona Ridgel, M.D.  Overnight she continued to have  atrial arrhythmias.  Dr. Graciela Husbands on the 14th discontinued her Rythmol and  repleted her potassium.  He started amiodarone for her rhythm and rate.  Dr.  Graciela Husbands stated that the patient did not want to go to [facility] or [facility].  She  converted to normal sinus rhythm by the morning of the __________.  Her  amiodarone was switched to p.o. medications.  Dr. Ladona Ridgel started Coumadin.  GI consultation was also performed by Dr. Christella Hartigan given her history of GERD  and bright red blood per rectum.  IV heparin was also placed.  By January 08, 2006, she was maintaining a normal sinus rhythm with frequent PACs.  Dr.  Graciela Husbands felt that she could be discharged home without anticoagulation  therapy.   DISPOSITION:  She is asked to maintain a low salt, fat and cholesterol diet.  Activity is not restricted.   She has received a prescription  for Protonix 40 mg daily with instructions  to take 20 minutes before breakfast.  She has also received a prescription  for amiodarone 400 mg b.i.d. for one week and 400 mg daily for one month.  Dr. Graciela Husbands stated the office will call her with a three-week follow-up  appointment to see him.  She will follow up with  Dr. Christella Hartigan in the office on February 21, 2006, at 11 a.m.  She was informed that  her appointment with Dr. Lina Sar has been cancelled.  She was instructed  not to take her Rythmol.   DISCHARGE TIME:  Less than 30 minutes.      Joellyn Rued, P.A. [facility]    ______________________________  [facility] Salvia, M.D.    EW/MEDQ  D:  01/08/2006  T:  01/08/2006  Job:  [id number]

## 2011-04-09 NOTE — Cardiovascular Report (Signed)
[place]  Patient:    Stephanie Rollins, Stephanie Rollins Visit Number: [id number] MRN: [id number]          Service Type: CAT Location: [facility] 2899 31 Attending Physician:  Meade Maw A Dictated by:   Lemont Fillers Fraser Din, M.D. Proc. Date: 07/28/01 Admit Date:  07/28/2001 Discharge Date: 07/28/2001   CC:         Darius Bump, M.D.   Cardiac Catheterization  REFERRING PHYSICIAN:  Darius Bump, M.D.  INDICATIONS FOR PROCEDURE:  Chest pain associated with significant risk factors of family history dyslipidemia, dyspnea, and recent hospitalization for atypical chest pain.  Stress Cardiolite was performed and revealed a small region of decreased uptake in the anterior interior apical wall associated with redistribution.  She was noted to have excellent exercise tolerance and a normal ejection fraction with an end-diastolic volume of 56 cc.  DESCRIPTION OF PROCEDURE:  After obtaining written informed consent, the patient was brought to the cardiac catheterization lab in the postabsorptive state.  Preoperative sedation was achieved with IV Versed.  The right groin was prepped and draped in a usual sterile fashion.  Local anesthesia was achieved using 1% Xylocaine.  A 6 French hemostasis sheath was placed into the right femoral artery using a modified Seldinger technique.  Selective coronary angiography was performed with a 6 [place], [place], JR4 Judkins catheter. Multiple views were obtained.  All catheter exchange were made over a guide wire.  Single plane ventriculogram was performed in the RAO position using a 6 French pigtail curved catheter.  After reviewing the films there was identifiable disease.  The patient was transferred to the holding area. Hemostasis was achieved using digital pressure.  FINDINGS:  The aorta pressure was 149/81, LV pressure was 150/24.  There was no gradient noted on pullback.  Single plane ventriculogram revealed no  wall motion.  There was post PVC mitral regurgitation.  The ejection fraction was approximately 65%.  CORONARY ANGIOGRAPHY:  Left main coronary artery:  The left main coronary artery was a long vessel that bifurcated into the left anterior descending and circumflex vessel.  There was no disease in the left main coronary artery.  Left anterior descending:  The left anterior descending gave rise to a small diagonal #1, moderate bifurcating diagonal #2, small bifurcating diagonal #3 and went on to end as an apical recurrent branch.  There was no disease in the left anterior descending nor its branches.  Circumflex vessel:  Circumflex vessel gave rise to a moderate OM-1 and then ended as a small AV groove vessel.  There was no significant disease in the circumflex or its branches.  Right coronary artery:  The right coronary artery was a large dominant artery, gave rise to a large RV marginal #1, large PDA and large PL branch.  There was no significant disease in the right coronary artery or its branches.  IMPRESSION: 1. Normal coronary angiography. 2. Normal single plane ventriculogram.  RECOMMENDATIONS:  To consider other etiologies for her chest pain. Dictated by:   Lemont Fillers Fraser Din, M.D. Attending Physician:  Mora Appl DD:  07/28/01 TD:  07/29/01 Job: [id number]

## 2011-04-09 NOTE — Discharge Summary (Signed)
NAMEMarland Kitchen  [name], [name] NO.:  [id number]   MEDICAL RECORD NO.:  [id number]          PATIENT TYPE:  INP   LOCATION:  4737                         FACILITY:  [facility]   PHYSICIAN:  Meade Maw, M.D.    DATE OF BIRTH:  [birth year]   DATE OF ADMISSION:  12/06/2005  DATE OF DISCHARGE:  12/10/2005                                 DISCHARGE SUMMARY   ADMISSION DIAGNOSIS:  Supraventricular tachycardia with presyncope.   DISCHARGE DIAGNOSIS:  DICTATION ENDED AT THIS POINT      Stephanie Rollins [place], [place]      Meade Maw, M.D.     RDM/MEDQ  D:  02/11/2006  T:  02/14/2006  Job:  [id number]

## 2011-04-19 ENCOUNTER — Encounter: Payer: Self-pay | Admitting: Family Medicine

## 2011-04-20 ENCOUNTER — Encounter: Payer: Self-pay | Admitting: Family Medicine

## 2011-04-20 ENCOUNTER — Ambulatory Visit (INDEPENDENT_AMBULATORY_CARE_PROVIDER_SITE_OTHER): Payer: Self-pay | Admitting: Family Medicine

## 2011-04-20 VITALS — BP 90/54 | HR 97 | Ht 63.75 in | Wt 139.0 lb

## 2011-04-20 DIAGNOSIS — M79609 Pain in unspecified limb: Secondary | ICD-10-CM

## 2011-04-20 DIAGNOSIS — R109 Unspecified abdominal pain: Secondary | ICD-10-CM

## 2011-04-20 DIAGNOSIS — M79606 Pain in leg, unspecified: Secondary | ICD-10-CM

## 2011-04-20 NOTE — Patient Instructions (Signed)
Consider taking a Probiotic.  Lactobacillis or Align are examples and see if this helps.

## 2011-04-20 NOTE — Progress Notes (Signed)
  Subjective:    Patient ID: Stephanie Rollins, female    DOB: May 10, 1951, 60 y.o.   MRN: [id number]  HPI   abdominal pain- She has had a lot of problems with her bowels. Has had a lot of gas. She was on protonix but can't afford it right now.  She is having sharp stabbing pain in her c/sec scar and is worried about that. That is new.  She feel the appearance of the scar is longer. No aggravating or alleviating symptoms.  She recently started walking on her treadmill.  Then she started to get some knee pain and some back pain. She has a prior history of bursitis in both knees as well as a prior fracture to the sacrum. The she backed off on her exercise. Her knees and back to feel some better. She's not taking any medication for this.   Review of Systems     Objective:   Physical Exam  Constitutional: She appears well-developed and well-nourished.  HENT:  Head: Atraumatic.  Abdominal: Soft. She exhibits no distension and no mass. There is no tenderness. There is no guarding.  Skin: Skin is warm and dry.       Her C-section incision is well healed. I don't see any abnormal changes to the scar itself. She does have a horizontal crease at the base of her abdomen which is where her up her abdominal fat overlaps. There is no sign of inflammation or infection in this area.  Psychiatric: She has a normal mood and affect. Her behavior is normal.          Assessment & Plan:  GERD - Discussed she can try prevacid and prilosec instead since can' afford the protonix. I do think this would help some of her GI issues. She does have a prior history of IBS. We also discussed starting a probiotic as I think this will be helpful as well. If she is still having symptoms we can always refer her to GI but she is not interested in that at this time. I don't think this is related to her IC currently. I did give her some reassurance about her scar. It does look normal. No sign of problems or infection.  I did  encourage her to start regular exercise again. Consider walking a mile maybe she can start with half a mile. He continues to build some strength and endurance her likely it will aggravate her bursitis. She could benefit from injections to her knees if this becomes more bothersome and doesn't improve on its own after a couple weeks.  She never had a vascular study scheduled for her leg pain bilaterally. She notes that when her legs get cold they get very painful and sore. I will reenter the orders we will try get her set up in the next couple of weeks.

## 2011-04-20 NOTE — Progress Notes (Signed)
  Subjective:    Patient ID: Stephanie Rollins, female    DOB: Apr 05, 1951, 60 y.o.   MRN: [id number]  HPI    Review of Systems     Objective:   Physical Exam        Assessment & Plan:  I also discussed starting a probiotic. I really think this will help regulate her bowels.

## 2011-04-23 ENCOUNTER — Telehealth: Payer: Self-pay | Admitting: Family Medicine

## 2011-04-23 NOTE — Telephone Encounter (Signed)
I had Victorino Dike take care of this today, and pt was notified.  I think Victorino Dike sched over in [facility] where they could pull from the order in  he system.  Just FYI. Jarvis Newcomer, LPN Domingo Dimes

## 2011-04-23 NOTE — Telephone Encounter (Signed)
Ultrasound taken care of by the referral coordinator. Jarvis Newcomer, LPN Dom[facility] Dimes

## 2011-04-23 NOTE — Telephone Encounter (Signed)
Dr. Linford Arnold . This referral did not go to [facility].  Here is another example of referral missed.  Could you possibly try to re-send to [facility]??  Please advise. Plan:  Routed to Dr. Marlyne Beards, LPN [facility] D[facility]

## 2011-04-23 NOTE — Telephone Encounter (Signed)
Notified Victorino Dike in referrals that we need to get the doppler study scheduled per Dr. Linford Arnold order even though the order did not roll to her workque.  Called pt to see if willing to go to [facility] to do test since they can pull order from the system.  Even still Victorino Dike in referrals trying to get scheduled today. Jarvis Newcomer, LPN Domingo Dimes

## 2011-04-23 NOTE — Telephone Encounter (Signed)
Pt appt ,made for 04-28-11 [facility].  Victorino Dike notified me and she is going to call the patient to let her know about her appt for doppler study. Jarvis Newcomer, LPN [facility] Dimes

## 2011-04-23 NOTE — Telephone Encounter (Signed)
I did not schedule this patient a U/S of her leg because it did not roll over to my referral workque for me to do..... I do not have a order for this procedure. Thanks

## 2011-04-23 NOTE — Telephone Encounter (Signed)
I can't resend this.  I will just do the same thing. We need to let Toniann Fail know about this so she can tell the right people. It has to be fixed on the back end.

## 2011-04-28 ENCOUNTER — Encounter: Payer: Self-pay | Admitting: *Deleted

## 2011-07-20 ENCOUNTER — Ambulatory Visit (INDEPENDENT_AMBULATORY_CARE_PROVIDER_SITE_OTHER): Payer: Self-pay | Admitting: Family Medicine

## 2011-07-20 ENCOUNTER — Encounter: Payer: Self-pay | Admitting: Family Medicine

## 2011-07-20 DIAGNOSIS — W57XXXA Bitten or stung by nonvenomous insect and other nonvenomous arthropods, initial encounter: Secondary | ICD-10-CM

## 2011-07-20 DIAGNOSIS — T148XXA Other injury of unspecified body region, initial encounter: Secondary | ICD-10-CM

## 2011-07-20 DIAGNOSIS — R21 Rash and other nonspecific skin eruption: Secondary | ICD-10-CM

## 2011-07-20 MED ORDER — TRIAMCINOLONE ACETONIDE 0.025 % EX OINT: TOPICAL_OINTMENT | Freq: Two times a day (BID) | CUTANEOUS | Status: DC

## 2011-07-20 NOTE — Patient Instructions (Signed)
Call if not continuing to get better.

## 2011-07-20 NOTE — Progress Notes (Signed)
  Subjective:    Patient ID: Stephanie Rollins, female    DOB: May 20, 1951, 60 y.o.   MRN: [id number]  HPI Worked in her yard o Sat. Then on Sunday nigh after watering her garden, noticed a tender spot on her abdomen. She noticed a red spot with red spots around the lesion. Then she noticed a rash on her left upper shoulder.  Also noticed a rash on the base of her scalp.  No fever.  She called the health dept. The lesion on her abdomen is better overall.  She was worried about a mosquito bite. Has been cleaning it with alcohol.  She has not had any fever, myalgias or other constitutional symptoms.  Her allergies have been worse lately as she has been getting on her garden. She notices especially when it will be her allergies seem to aggravate it. She felt she had some lymph nodes in her neck swollen earlier this week but she feels overall better. She denies any sore throat.  Review of Systems     Objective:   Physical Exam  Constitutional: She is oriented to person, place, and time. She appears well-developed.  HENT:  Head: Normocephalic and atraumatic.  Right Ear: External ear normal.  Left Ear: External ear normal.  Mouth/Throat: Oropharynx is clear and moist.  Eyes: Conjunctivae are normal. Pupils are equal, round, and reactive to light.  Neck:       No cervical lymphadenopathy  Cardiovascular: Normal rate, regular rhythm and normal heart sounds.   Pulmonary/Chest: Effort normal and breath sounds normal.  Neurological: She is alert and oriented to person, place, and time.  Skin: Skin is warm and dry.       She has a small erythematous papule on her mid abdomen.  Has a erythematous macular rash on the left shoulder. Does have an erythematous macular ash on her lower posterior scalp  Psychiatric: She has a normal mood and affect. Her behavior is normal.          Assessment & Plan:  Bug Bite-I gave her reassurance. It seems to be resolving fairly quickly. I don't see any sign that  this could be a mosquito bite such as Chad Nile virus were a tick bite such as may cause Lyme's disease.   Rash-She does have an erythematous macular dry rash on her left upper shoulder. I went this could be exacerbated because she's been applying alcohol to it. For now we will try triamcinolone cream as well as some Vaseline and asked her to discontinue the alcohol application.  Allergies-I just recommend avoidance as much as possible. She says she really wears a mask when she works out in the garden. I think this would certainly be helpful because she is somewhat fearful of using allergy medications and doesn't like nasal sprays.

## 2011-10-18 ENCOUNTER — Ambulatory Visit (INDEPENDENT_AMBULATORY_CARE_PROVIDER_SITE_OTHER): Payer: Self-pay | Admitting: Family Medicine

## 2011-10-18 DIAGNOSIS — R300 Dysuria: Secondary | ICD-10-CM

## 2011-10-18 DIAGNOSIS — Z23 Encounter for immunization: Secondary | ICD-10-CM

## 2011-10-18 LAB — POCT URINALYSIS DIPSTICK
Bilirubin, UA: NEGATIVE
Glucose, UA: NEGATIVE
Leukocytes, UA: NEGATIVE
Nitrite, UA: NEGATIVE

## 2011-10-18 NOTE — Progress Notes (Signed)
  Subjective:    Patient ID: Stephanie Rollins, female    DOB: 10-Jul-1951, 60 y.o.   MRN: [id number]  HPI Pt states she has IC. She c/o dysuria x 1 week. C/o urgency, frequency and burning. Denies any other Sxs. No fever.     Review of Systems     Objective:   Physical Exam        Assessment & Plan:  Dysuria - UA is neg. Will send urine culture and call with results.

## 2011-10-20 LAB — URINE CULTURE: Organism ID, Bacteria: NO GROWTH

## 2012-05-17 ENCOUNTER — Other Ambulatory Visit: Payer: Self-pay | Admitting: *Deleted

## 2012-05-17 MED ORDER — METOPROLOL TARTRATE 25 MG PO TABS: 25.0000 mg | ORAL_TABLET | Freq: Two times a day (BID) | ORAL | Status: DC

## 2012-07-03 ENCOUNTER — Other Ambulatory Visit: Payer: Self-pay | Admitting: Family Medicine

## 2012-07-03 DIAGNOSIS — Z1231 Encounter for screening mammogram for malignant neoplasm of breast: Secondary | ICD-10-CM

## 2012-07-07 ENCOUNTER — Ambulatory Visit (INDEPENDENT_AMBULATORY_CARE_PROVIDER_SITE_OTHER): Payer: Self-pay | Admitting: Sports Medicine

## 2012-07-07 ENCOUNTER — Encounter: Payer: Self-pay | Admitting: Sports Medicine

## 2012-07-07 ENCOUNTER — Ambulatory Visit (INDEPENDENT_AMBULATORY_CARE_PROVIDER_SITE_OTHER): Payer: Self-pay

## 2012-07-07 VITALS — BP 117/71 | HR 69 | Temp 97.7°F | Resp 18 | Wt 127.0 lb

## 2012-07-07 DIAGNOSIS — R079 Chest pain, unspecified: Secondary | ICD-10-CM

## 2012-07-07 DIAGNOSIS — R0781 Pleurodynia: Secondary | ICD-10-CM | POA: Insufficient documentation

## 2012-07-07 DIAGNOSIS — R0789 Other chest pain: Secondary | ICD-10-CM | POA: Insufficient documentation

## 2012-07-07 NOTE — Assessment & Plan Note (Signed)
She has merely soft tissue contusion on her right chest. She declines any prescription strength medication. She will use otcTylenol, and she'll let us know if she desires any further treatment.

## 2012-07-07 NOTE — Progress Notes (Signed)
Patient ID: Stephanie Rollins, female   DOB: 1951-06-05, 61 y.o.   MRN: [id number] Subjective:    CC: right-sided rib pain  HPI:the patient is a very pleasant 61 year old female who comes in with pain in the right side of her chest after leaning over her toilet while cleaning the bathroom. She notes that she put pressure over the side of her chest, she felt a pop, and then a sharp pain. The pain was localized along the lower right sided thoracic rib cage, and did not radiate. She denies any shortness of breath, any cough, or any bruising.  She's not tried any medicine for this. It is not worsened with taking deep breaths.  Past medical history, Surgical history, Family history, Social history, Allergies, and medications have been entered into the medical record, reviewed, and no changes needed.   Review of Systems: No fevers, chills, night sweats, weight loss, chest pain, or shortness of breath.   Objective:    General: Well Developed, well nourished, and in no acute distress.  Neuro: Alert and oriented x3, extra-ocular muscles intact.  HEENT: Normocephalic, atraumatic, pupils equal round reactive to light, neck supple, no masses, no lymphadenopathy, thyroid nonpalpable.  Skin: Warm and dry, no rashes. Cardiac: Regular rate and rhythm, no murmurs rubs or gallops.  Respiratory: Clear to auscultation bilaterally. Not using accessory muscles, speaking in full sentences. Chest is mildly tender to palpation along the ribs on the right side, there are no step-offs, and there's no palpable crepitus.  X-rays of the right-sided ribs were ordered and interpreted by me, they show no sign of fracture,and no sign of pneumothorax  Impression and Recommendations:

## 2012-07-10 ENCOUNTER — Ambulatory Visit: Payer: Self-pay | Admitting: Sports Medicine

## 2012-07-12 ENCOUNTER — Encounter: Payer: Self-pay | Admitting: Cardiology

## 2012-07-12 ENCOUNTER — Ambulatory Visit (INDEPENDENT_AMBULATORY_CARE_PROVIDER_SITE_OTHER): Payer: Self-pay | Admitting: Cardiology

## 2012-07-12 ENCOUNTER — Ambulatory Visit (HOSPITAL_BASED_OUTPATIENT_CLINIC_OR_DEPARTMENT_OTHER): Payer: Self-pay

## 2012-07-12 VITALS — BP 125/69 | HR 58 | Wt 127.0 lb

## 2012-07-12 DIAGNOSIS — I4891 Unspecified atrial fibrillation: Secondary | ICD-10-CM

## 2012-07-12 DIAGNOSIS — R002 Palpitations: Secondary | ICD-10-CM

## 2012-07-12 DIAGNOSIS — I4949 Other premature depolarization: Secondary | ICD-10-CM

## 2012-07-12 DIAGNOSIS — I059 Rheumatic mitral valve disease, unspecified: Secondary | ICD-10-CM

## 2012-07-12 NOTE — Assessment & Plan Note (Signed)
History of mitral valve prolapse

## 2012-07-12 NOTE — Assessment & Plan Note (Signed)
Continue beta blocker. 

## 2012-07-12 NOTE — Progress Notes (Signed)
   HPI: Pleasant female for fu of SVT as well as atrial fibrillation and mitral valve prolapse.  Cardiac catheterization in September of 2002 showed normal coronary arteries and normal LV function with an ejection fraction of 65%. Myoview in 2007 showed EF 70 and normal perfusion. Echocardiogram in 2008 showed normal LV function and trace mitral regurgitation. She has had a previous ablation of her atrial fibrillation in August 2007 at [facility]. She has also had recurrent palpitations secondary to PVCs treated with beta blockade. Since I last saw her in Nov 2011, she denies dyspnea. She continues to have an occasional sharp pain in her left upper chest. It is not related to exertion and she does not have exertional chest pain. She continues to have occasional brief flutters but no sustained palpitations. These are unchanged.  Current Outpatient Prescriptions  Medication Sig Dispense Refill  . aspirin 81 MG EC tablet Take 81 mg by mouth daily.        . metoprolol tartrate (LOPRESSOR) 25 MG tablet 1/2 tab po bid      . DISCONTD: metoprolol tartrate (LOPRESSOR) 25 MG tablet Take 1 tablet (25 mg total) by mouth 2 (two) times daily.  180 tablet  3     Past Medical History  Diagnosis Date  . Hearing loss     75%  . Swallowing difficulty     problems for second/need dentures  . Menopausal state   . Paroxysmal a-fib     status post ablation  . Dyslipidemia   . MVP (mitral valve prolapse)   . IBS (irritable bowel syndrome)     remote history  . Hypertension     Past Surgical History  Procedure Date  . Cesarean section 1971, 1973  . Nasal septum surgery 1977  . Pulmonary vein isolation 06-2006    procedure for AFIB /[facility]    History   Social History  . Marital Status: Divorced    Spouse Name: N/A    Number of Children: N/A  . Years of Education: N/A   Occupational History  . Not on file.   Social History Main Topics  . Smoking status: Never Smoker   . Smokeless  tobacco: Not on file  . Alcohol Use: No  . Drug Use:   . Sexually Active:      unemployed, GED, divorced,2 adult children, no caffeine, no regular exercise   Other Topics Concern  . Not on file   Social History Narrative  . No narrative on file    ROS: no fevers or chills, productive cough, hemoptysis, dysphasia, odynophagia, melena, hematochezia, dysuria, hematuria, rash, seizure activity, orthopnea, PND, pedal edema, claudication. Remaining systems are negative.  Physical Exam: Well-developed well-nourished in no acute distress.  Skin is warm and dry.  HEENT is normal.  Neck is supple.  Chest is clear to auscultation with normal expansion.  Cardiovascular exam is regular rate and rhythm.  Abdominal exam nontender or distended. No masses palpated. Extremities show no edema. neuro grossly intact  ECG sinus bradycardia at a rate of 58. Normal axis. No ST changes.

## 2012-07-12 NOTE — Assessment & Plan Note (Signed)
Status post ablation. No evidence of recurrence. Continue aspirin and metoprolol.

## 2012-07-12 NOTE — Patient Instructions (Addendum)
Your physician wants you to follow-up in: ONE YEAR WITH DR CRENSHAW IN [place] You will receive a reminder letter in the mail two months in advance. If you don't receive a letter, please call our office to schedule the follow-up appointment.  

## 2012-07-19 ENCOUNTER — Telehealth: Payer: Self-pay | Admitting: Family Medicine

## 2012-07-19 ENCOUNTER — Ambulatory Visit (INDEPENDENT_AMBULATORY_CARE_PROVIDER_SITE_OTHER): Payer: Self-pay | Admitting: Family Medicine

## 2012-07-19 ENCOUNTER — Other Ambulatory Visit (HOSPITAL_COMMUNITY)
Admission: RE | Admit: 2012-07-19 | Discharge: 2012-07-19 | Disposition: A | Discharge status: Home / Self Care | Payer: Self-pay | Source: Ambulatory Visit | Attending: Family Medicine | Admitting: Family Medicine

## 2012-07-19 ENCOUNTER — Other Ambulatory Visit: Payer: Self-pay | Admitting: Family Medicine

## 2012-07-19 ENCOUNTER — Encounter: Payer: Self-pay | Admitting: Family Medicine

## 2012-07-19 VITALS — BP 121/76 | HR 68 | Wt 127.0 lb

## 2012-07-19 DIAGNOSIS — Z01419 Encounter for gynecological examination (general) (routine) without abnormal findings: Secondary | ICD-10-CM | POA: Insufficient documentation

## 2012-07-19 DIAGNOSIS — Z1151 Encounter for screening for human papillomavirus (HPV): Secondary | ICD-10-CM | POA: Insufficient documentation

## 2012-07-19 DIAGNOSIS — Z1231 Encounter for screening mammogram for malignant neoplasm of breast: Secondary | ICD-10-CM

## 2012-07-19 DIAGNOSIS — Z780 Asymptomatic menopausal state: Secondary | ICD-10-CM

## 2012-07-19 LAB — COMPLETE METABOLIC PANEL WITH GFR
Alkaline Phosphatase: 67 U/L (ref 39–117)
BUN: 11 mg/dL (ref 6–23)
CO2: 29 mEq/L (ref 19–32)
Creat: 0.69 mg/dL (ref 0.50–1.10)
GFR, Est African American: >89 mL/min
GFR, Est Non African American: >89 mL/min
Glucose, Bld: 98 mg/dL (ref 70–99)
Total Bilirubin: 0.8 mg/dL (ref 0.3–1.2)

## 2012-07-19 LAB — CBC
HCT: 38.1 % (ref 36.0–46.0)
MCH: 29.8 pg (ref 26.0–34.0)
MCV: 85.2 fL (ref 78.0–100.0)
Platelets: 263 10*3/uL (ref 150–400)
RDW: 13 % (ref 11.5–15.5)
WBC: 5.2 10*3/uL (ref 4.0–10.5)

## 2012-07-19 LAB — POCT URINALYSIS DIPSTICK
Glucose, UA: NEGATIVE
Ketones, UA: NEGATIVE
Protein, UA: NEGATIVE
Spec Grav, UA: >=1.03
Urobilinogen, UA: 0.2

## 2012-07-19 LAB — LIPID PANEL
Cholesterol: 183 mg/dL (ref 0–200)
HDL: 51 mg/dL (ref >39)
Total CHOL/HDL Ratio: 3.6 Ratio

## 2012-07-19 MED ORDER — SULFAMETHOXAZOLE-TRIMETHOPRIM 800-160 MG PO TABS: 1.0000 | ORAL_TABLET | Freq: Two times a day (BID) | ORAL | Status: AC

## 2012-07-19 NOTE — Telephone Encounter (Signed)
Called and no answer and no vm  

## 2012-07-19 NOTE — Telephone Encounter (Signed)
Call patient: Please let her that she did have some trace blood in her urine so we'll go ahead and treat her for urinary tract infection. If she feels her symptoms are not resolving then please let me know not like to see urology referral for possible urethral stricture. Also please send her urine for culture.

## 2012-07-19 NOTE — Patient Instructions (Signed)
Start a regular exercise program and make sure you are eating a healthy diet Try to eat 4 servings of dairy a day  Your vaccines are up to date.   

## 2012-07-19 NOTE — Progress Notes (Signed)
  Subjective:     Stephanie Rollins is a 61 y.o. female and is here for a comprehensive physical exam. The patient reports problems - Urinary frequency sxs for several days. + dysuria.  No blood in urine or fever.   Marland Kitchen   History   Social History  . Marital Status: Divorced    Spouse Name: N/A    Number of Children: N/A  . Years of Education: N/A   Occupational History  . Not on file.   Social History Main Topics  . Smoking status: Never Smoker   . Smokeless tobacco: Not on file  . Alcohol Use: No  . Drug Use:   . Sexually Active:      unemployed, GED, divorced,2 adult children, no caffeine, no regular exercise   Other Topics Concern  . Not on file   Social History Narrative  . No narrative on file   Health Maintenance  Topic Date Due  . Zostavax  06/08/2009  . Influenza Vaccine  08/22/2012  . Mammogram  03/08/2013  . Pap Smear  07/20/2015  . Colonoscopy  11/23/2015  . Tetanus/tdap  03/16/2021    The following portions of the patient's history were reviewed and updated as appropriate: allergies, current medications, past family history, past medical history, past social history, past surgical history and problem list.  Review of Systems A comprehensive review of systems was negative.   Objective:    BP 121/76  Pulse 68  Wt 127 lb (57.607 kg) General appearance: alert, cooperative and appears stated age Head: Normocephalic, without obvious abnormality, atraumatic Eyes: conj clear, EOMi, PEERLA Ears: normal TM's and external ear canals both ears Nose: Nares normal. Septum midline. Mucosa normal. No drainage or sinus tenderness. Throat: lips, mucosa, and tongue normal; teeth and gums normal Neck: no adenopathy, no carotid bruit, no JVD, supple, symmetrical, trachea midline and thyroid not enlarged, symmetric, no tenderness/mass/nodules Back: symmetric, no curvature. ROM normal. No CVA tenderness. Lungs: clear to auscultation bilaterally Breasts: normal appearance, no  masses or tenderness Heart: regular rate and rhythm, S1, S2 normal, no murmur, click, rub or gallop Abdomen: soft, non-tender; bowel sounds normal; no masses,  no organomegaly Pelvic: cervix normal in appearance, external genitalia normal, no adnexal masses or tenderness, no cervical motion tenderness, rectovaginal septum normal, uterus normal size, shape, and consistency, vagina normal without discharge and vaginal atrophy Extremities: extremities normal, atraumatic, no cyanosis or edema Pulses: 2+ and symmetric Skin: Skin color, texture, turgor normal. No rashes or lesions Lymph nodes: Cervical, supraclavicular, and axillary nodes normal. Neurologic: Grossly normal    Assessment:    Healthy female exam.      Plan:     See After Visit Summary for Counseling Recommendations  Start a regular exercise program and make sure you are eating a healthy diet Try to eat 4 servings of dairy a day or take a calcium supplement (500mg  twice a day). Your vaccines are up to date.   Dysuria - Will check UA.  If neg then may need to see Urologist for possible urethral stricture. Urinalysis was positive only for blood. I will go ahead and treat her since her symptoms have been more recent. I'll also send a culture to confirm. Will treat with Bactrim because of allergies.  Declined shingles vaccine.    Schedule bone density and mammogram.

## 2012-07-21 LAB — T4, FREE: Free T4: 1.11 ng/dL (ref 0.80–1.80)

## 2012-07-21 LAB — T3, FREE: T3, Free: 3.3 pg/mL (ref 2.3–4.2)

## 2012-07-22 NOTE — Telephone Encounter (Signed)
Patient states she does still have frequent urination and believes this is a symptom of her IC. She also states she cannot afford to see a specialist at this time. I am not sure if the is urine left to send for a culture, unless it has already been sent.

## 2012-08-21 ENCOUNTER — Telehealth: Payer: Self-pay | Admitting: Family Medicine

## 2012-08-21 DIAGNOSIS — M79606 Pain in leg, unspecified: Secondary | ICD-10-CM

## 2012-08-21 NOTE — Telephone Encounter (Signed)
Orders placed.

## 2012-08-21 NOTE — Telephone Encounter (Signed)
Patient called and states that she never had a Lower Extremity Arterial Doppler completed that was ordered last year and now she needs a NEW ORDER to have this scheduled. Please let me know when new order has been placed so I can schedule patient. Patient would like to be seen Thursday at 3:00 at [facility] for this procedure. Thanks, [employer]

## 2012-09-19 ENCOUNTER — Telehealth: Payer: Self-pay | Admitting: *Deleted

## 2012-09-19 NOTE — Telephone Encounter (Signed)
Patient called and request that with her Thyroid labs she would like a B-12 level also.

## 2012-09-19 NOTE — Telephone Encounter (Signed)
That is fine. Okay to order.

## 2012-09-20 ENCOUNTER — Telehealth: Payer: Self-pay | Admitting: *Deleted

## 2012-09-20 DIAGNOSIS — E039 Hypothyroidism, unspecified: Secondary | ICD-10-CM

## 2012-10-03 ENCOUNTER — Ambulatory Visit (INDEPENDENT_AMBULATORY_CARE_PROVIDER_SITE_OTHER): Payer: Self-pay

## 2012-10-03 ENCOUNTER — Ambulatory Visit (INDEPENDENT_AMBULATORY_CARE_PROVIDER_SITE_OTHER): Payer: Self-pay | Admitting: Family Medicine

## 2012-10-03 DIAGNOSIS — Z780 Asymptomatic menopausal state: Secondary | ICD-10-CM

## 2012-10-03 DIAGNOSIS — Z1231 Encounter for screening mammogram for malignant neoplasm of breast: Secondary | ICD-10-CM

## 2012-10-03 DIAGNOSIS — Z23 Encounter for immunization: Secondary | ICD-10-CM

## 2012-10-03 DIAGNOSIS — M810 Age-related osteoporosis without current pathological fracture: Secondary | ICD-10-CM

## 2012-10-03 DIAGNOSIS — E039 Hypothyroidism, unspecified: Secondary | ICD-10-CM

## 2012-10-03 NOTE — Progress Notes (Signed)
  Subjective:    Patient ID: Stephanie Rollins, female    DOB: 04/23/1951, 61 y.o.   MRN: [id number] Flu shot HPI    Review of Systems     Objective:   Physical Exam        Assessment & Plan:

## 2012-10-04 ENCOUNTER — Encounter (INDEPENDENT_AMBULATORY_CARE_PROVIDER_SITE_OTHER): Payer: Self-pay

## 2012-10-04 ENCOUNTER — Telehealth: Payer: Self-pay | Admitting: *Deleted

## 2012-10-04 ENCOUNTER — Other Ambulatory Visit: Payer: Self-pay | Admitting: Family Medicine

## 2012-10-04 DIAGNOSIS — I739 Peripheral vascular disease, unspecified: Secondary | ICD-10-CM

## 2012-10-04 DIAGNOSIS — M79606 Pain in leg, unspecified: Secondary | ICD-10-CM

## 2012-10-04 LAB — VITAMIN B12: Vitamin B-12: 370 pg/mL (ref 211–911)

## 2012-10-04 MED ORDER — ALENDRONATE SODIUM 70 MG PO TABS: 70.0000 mg | ORAL_TABLET | ORAL | Status: DC

## 2012-10-05 NOTE — Telephone Encounter (Signed)
Error...other message

## 2012-10-05 NOTE — Telephone Encounter (Signed)
See other message

## 2012-10-12 ENCOUNTER — Encounter: Payer: Self-pay | Admitting: Family Medicine

## 2012-10-12 ENCOUNTER — Ambulatory Visit (INDEPENDENT_AMBULATORY_CARE_PROVIDER_SITE_OTHER): Payer: Self-pay | Admitting: Family Medicine

## 2012-10-12 VITALS — BP 96/58 | HR 58 | Ht 64.0 in | Wt 120.0 lb

## 2012-10-12 DIAGNOSIS — R1012 Left upper quadrant pain: Secondary | ICD-10-CM

## 2012-10-12 DIAGNOSIS — R002 Palpitations: Secondary | ICD-10-CM

## 2012-10-12 DIAGNOSIS — J309 Allergic rhinitis, unspecified: Secondary | ICD-10-CM

## 2012-10-12 DIAGNOSIS — R634 Abnormal weight loss: Secondary | ICD-10-CM

## 2012-10-12 NOTE — Progress Notes (Signed)
  Subjective:    Patient ID: Stephanie Rollins, female    DOB: Dec 24, 1950, 61 y.o.   MRN: [id number]  HPI  She is here to discuss her labs and her bone denisty and mammogram and dopplers.   Says she increased her metoprolol back to whole tab BID bc of palpitations.    Says neighbor burned leaves over the weekend nad has felt sic sinc then her sinuses have bene hurting and ear pressure and pain.    Having LUQ pain that is intermittant.  Says feel like has to run to the bathroom (BM). Last 2 weeks last month and then was fine until today. Started again after eating a poptart and glass of mild.  Still feels sore. Says feels it is worse after BM Review of Systems     Objective:   Physical Exam  Constitutional: She is oriented to person, place, and time. She appears well-developed and well-nourished.  HENT:  Head: Normocephalic and atraumatic.  Right Ear: External ear normal.  Left Ear: External ear normal.  Nose: Nose normal.  Mouth/Throat: Oropharynx is clear and moist.       TMs and canals are clear.   Eyes: Conjunctivae normal and EOM are normal. Pupils are equal, round, and reactive to light.  Neck: Neck supple. No thyromegaly present.  Cardiovascular: Normal rate, regular rhythm and normal heart sounds.   Pulmonary/Chest: Effort normal and breath sounds normal. She has no wheezes.  Abdominal: Soft. Bowel sounds are normal. She exhibits no distension and no mass. There is tenderness. There is no rebound and no guarding.       +diffusely tender  Lymphadenopathy:    She has no cervical adenopathy.  Neurological: She is alert and oriented to person, place, and time.  Skin: Skin is warm and dry.  Psychiatric: She has a normal mood and affect. Her behavior is normal.          Assessment & Plan:  Reviewd her lab results.  Thyroid is borderline elevated. Will keep an eye on this. F/U and repeat labs in 6-12 months.  Osteoporosis - Reviewed her results and discussed need for  bishphosphonate. She is very afraid fo starting a new medication. She declined treatment. Make sure getting adequeat calcium and vit D and getting regule exercise.   Cold sensation on the left lower leg. Reasurred her that her LE dopplers were normal.  Likely mononeuropathy.   AR- secondary to smoke exposure. She is getting better on her own and her ENT adn chest exam is normal.   Palpitations- Well controlled on inc dose of metoprolol  Abnormal weight loss- She is not eating enough calories ad we discussed the need to start walking daily for exercise.   LUQ pain - will rule out pancreatitis. If normall suspect probably diary intolerance.  Her sxs are strongly suggestive of milk intolerance.  Rec dairy free diet for 2-3 weeks to see if sxs resolve and she feels better.

## 2012-10-12 NOTE — Patient Instructions (Addendum)
Lactose-Free Diet Lactose is a carbohydrate that is found mainly in milk and milk products, as well as in foods with added milk or whey. Lactose must be digested by the enzyme lactase in order to be used by the body. Lactose intolerance occurs when there is a shortage of lactase. When your body is not able to digest lactose, you may feel sick to your stomach (nausea), bloated, and have cramps, gas, and diarrhea. TYPES OF LACTASE DEFICIENCY  Primary lactase deficiency. This is the most common type. It is characterized by a slow decrease in lactase activity.   Secondary lactase deficiency. This occurs following injury to the small intestinal mucosa as a result of a disease or condition. It can also occur as a result of surgery or after treatment with antibiotic medicines or cancer drugs.  Tolerance to lactose varies widely. Each person must determine how much milk can be consumed without developing symptoms. Drinking smaller portions of milk throughout the day may be helpful. Some studies suggest that slowing gastric emptying may help increase tolerance of milk products. This may be done by:  Consuming milk or milk products with a meal rather than alone.   Consuming milk with a higher fat content.  There are many dairy products that may be tolerated better than milk by some people, including:  Cheese (especially aged cheese). The lactose content is much lower than in milk.   Cultured dairy products, such as yogurt, buttermilk, cottage cheese, and sweet acidophilus milk (kefir). These products are usually well tolerated by lactase-deficient people. This is because the healthy bacteria help digest lactose.   Lactose-hydrolyzed milk. This product contains 40% to 90% less lactose than milk and may also be well tolerated.  ADEQUACY These diets may be deficient in calcium, riboflavin, and vitamin D, according to the Recommended Dietary Allowances of the [employer]. Depending on individual  tolerances and the use of milk substitutes, milk, or other dairy products, you may be able to meet these recommendations. SPECIAL NOTES  Lactose is a carbohydrate. The main food source for lactose is dairy products. Reading food labels is important. Many products contain lactose even when they are not made from milk. Look for the following words: whey, milk solids, dry milk solids, nonfat dry milk powder. Typical sources of lactose other than dairy products include breads, candies, cold cuts, prepared and processed foods, and commercial sauces and gravies.   All foods must be prepared without milk, cream, or other dairy foods.   A vitamin or mineral supplement may be necessary. Consult your caregiver or Registered Dietitian.   Lactose is also found in many prescription and over-the-counter medicines.   Soy milk and lactose-free supplements may be used as an alternative to milk.  CHOOSING FOODS Breads and Starches  Allowed: Breads and rolls made without milk. [place], [place], or [place] bread. Soda crackers, graham crackers. Any crackers prepared without lactose. Cooked or dry cereals prepared without lactose (read labels). Any potatoes, pasta, or rice prepared without milk or lactose. Popcorn.   Avoid: Breads and rolls that contain milk. Prepared mixes such as muffins, biscuits, waffles, pancakes. Sweet rolls, donuts, [place] toast (if made with milk or lactose). Zwieback crackers, corn curls, or any crackers that contain lactose. Cooked or dry cereals prepared with lactose (read labels). Instant potatoes, frozen [place] fries, scalloped or au gratin potatoes.  Vegetables  Allowed: Fresh, frozen, and canned vegetables.   Avoid: Creamed or breaded vegetables. Vegetables in a cheese sauce or with lactose-containing margarines.  Fruit  Allowed: All fresh, canned, or frozen fruits that are not processed with lactose.   Avoid: Any canned or frozen fruits processed with lactose.  Meat and Meat  Substitutes  Allowed: Plain beef, chicken, fish, [place], lamb, veal, pork, or ham. Kosher prepared meat products. Strained or junior meats that do not contain milk. Eggs, soy meat substitutes, nuts.   Avoid: Scrambled eggs, omelets, and souffles that contain milk. Creamed or breaded meat, fish, or fowl. Sausage products such as wieners, liver sausage, or cold cuts that contain milk solids. Cheese, cottage cheese, or cheese spreads.  Milk  Allowed: None.   Avoid: Milk (whole, 2%, skim, or chocolate). Evaporated, powdered, or condensed milk. Malted milk.  Soups and Combination Foods  Allowed: Bouillon, broth, vegetable soups, clear soups, consomms. Homemade soups made with allowed ingredients. Combination or prepared foods that do not contain milk or milk products (read labels).   Avoid: Cream soups, chowders, commercially prepared soups containing lactose. Macaroni and cheese, pizza. Combination or prepared foods that contain milk or milk products.  Desserts and Sweets  Allowed: Water and fruit ices, gelatin, angel food cake. Homemade cookies, pies, or cakes made from allowed ingredients. Pudding (if made with water or a milk substitute). Lactose-free tofu desserts. Sugar, honey, corn syrup, jam, jelly, marmalade, molasses (beet sugar). Pure sugar candy, marshmallows.   Avoid: Ice cream, ice milk, sherbet, custard, pudding, frozen yogurt. Commercial cake and cookie mixes. Desserts that contain chocolate. Pie crust made with milk-containing margarine. Reduced calorie desserts made with a sugar substitute that contains lactose. Toffee, peppermint, butterscotch, chocolate, caramels.  Fats and Oils  Allowed: Butter (as tolerated, contains very small amounts of lactose). Margarines and dressings that do not contain milk. Vegetable oils, shortening, mayonnaise, nondairy cream and whipped toppings without lactose or milk solids added. Tomasa Blase.   Avoid: Margarines and salad dressings containing milk.  Cream, cream cheese, peanut butter with added milk solids, sour cream, chip dips made with sour cream.  Beverages  Allowed: Carbonated drinks, tea, coffee and freeze-dried coffee, some instant coffees (check labels). Fruit drinks, fruit and vegetable juice, rice or soy milk.   Avoid: Hot chocolate. Some cocoas, some instant coffees, instant iced teas, powdered fruit drinks (read labels).  Condiments  Allowed: Soy sauce, carob powder, olives, gravy made with water, baker's cocoa, pickles, pure seasonings and spices, wine, pure monosodium glutamate, catsup, mustard.   Avoid: Some chewing gums, chocolate, some cocoas. Certain antibiotics and vitamin or mineral preparations. Spice blends if they contain milk products. MSG extender. Artificial sweeteners that contain lactose. Some nondairy creamers (read labels).  SAMPLE MENU Breakfast  Orange juice.   Banana.   Bran cereal.   Nondairy creamer.   Vienna bread, toasted.   Butter or milk-free margarine.   Coffee or tea.  Lunch  Chicken breast.   Rice.   Green beans.   Butter or milk-free margarine.   Fresh melon.   Coffee or tea.  Dinner  [place].   Baked potato.   Butter or milk-free margarine.   Broccoli.   Lettuce salad with vinegar and oil dressing.   [employer].   Coffee or tea.  Document Released: 04/30/2002 Document Revised: 01/31/2012 Document Reviewed: 02/05/2011 [facility] Patient Information 2013 [place], [place].   Osteoporosis Throughout your life, your body breaks down old bone and replaces it with new bone. As you get older, your body does not replace bone as quickly as it breaks it down. By the age of 30 years, most people  begin to gradually lose bone because of the imbalance between bone loss and replacement. Some people lose more bone than others. Bone loss beyond a specified normal degree is considered osteoporosis.   Osteoporosis affects the strength and durability of your bones. The inside  of the ends of your bones and your flat bones, like the bones of your pelvis, look like honeycomb, filled with tiny open spaces. As bone loss occurs, your bones become less dense. This means that the open spaces inside your bones become bigger and the walls between these spaces become thinner. This makes your bones weaker. Bones of a person with osteoporosis can become so weak that they can break (fracture) during minor accidents, such as a simple fall. CAUSES   The following factors have been associated with the development of osteoporosis:  Smoking.   Drinking more than 2 alcoholic drinks several days per week.   Long-term use of certain medicines:   Corticosteroids.   Chemotherapy medicines.   Thyroid medicines.   Antiepileptic medicines.   Gonadal hormone suppression medicine.   Immunosuppression medicine.   Being underweight.   Lack of physical activity.   Lack of exposure to the sun. This can lead to vitamin D deficiency.   Certain medical conditions:   Certain inflammatory bowel diseases, such as Crohn's disease and ulcerative colitis.   Diabetes.   Hyperthyroidism.   Hyperparathyroidism.  RISK FACTORS Anyone can develop osteoporosis. However, the following factors can increase your risk of developing osteoporosis:  Gender Women are at higher risk than men.   Age Being older than 50 years increases your risk.   Ethnicity White and Asian people have an increased risk.   Weight Being extremely underweight can increase your risk of osteoporosis.   Family history of osteoporosis Having a family member who has developed osteoporosis can increase your risk.  SYMPTOMS   Usually, people with osteoporosis have no symptoms.   DIAGNOSIS   Signs during a physical exam that may prompt your caregiver to suspect osteoporosis include:  Decreased height. This is usually caused by the compression of the bones that form your spine (vertebrae) because they have weakened and  become fractured.   A curving or rounding of the upper back (kyphosis).  To confirm signs of osteoporosis, your caregiver may request a procedure that uses 2 low-dose X-ray beams with different levels of energy to measure your bone mineral density (dual-energy X-ray absorptiometry [DXA]). Also, your caregiver may check your level of vitamin D. TREATMENT   The goal of osteoporosis treatment is to strengthen bones in order to decrease the risk of bone fractures. There are different types of medicines available to help achieve this goal. Some of these medicines work by slowing the processes of bone loss. Some medicines work by increasing bone density. Treatment also involves making sure that your levels of calcium and vitamin D are adequate. PREVENTION   There are things you can do to help prevent osteoporosis. Adequate intake of calcium and vitamin D can help you achieve optimal bone mineral density. Regular exercise can also help, especially resistance and high-impact activities. If you smoke, quitting smoking is an important part of osteoporosis prevention. MAKE SURE YOU:  Understand these instructions.   Will watch your condition.   Will get help right away if you are not doing well or get worse.  Document Released: 08/18/2005 Document Revised: 01/31/2012 Document Reviewed: 10/23/2011 [facility] Patient Information 2013 [place], [place].

## 2012-10-17 LAB — CBC WITH DIFFERENTIAL/PLATELET
Basophils Absolute: 0 10*3/uL (ref 0.0–0.1)
HCT: 37.1 % (ref 36.0–46.0)
Hemoglobin: 12.6 g/dL (ref 12.0–15.0)
Lymphocytes Relative: 42 % (ref 12–46)
Lymphs Abs: 2.3 10*3/uL (ref 0.7–4.0)
MCV: 85.5 fL (ref 78.0–100.0)
Monocytes Absolute: 0.4 10*3/uL (ref 0.1–1.0)
Neutro Abs: 2.6 10*3/uL (ref 1.7–7.7)
RBC: 4.34 MIL/uL (ref 3.87–5.11)
RDW: 13.7 % (ref 11.5–15.5)
WBC: 5.4 10*3/uL (ref 4.0–10.5)

## 2012-10-17 LAB — LIPASE: Lipase: 26 U/L (ref 0–75)

## 2012-10-17 LAB — AMYLASE: Amylase: 33 U/L (ref 0–105)

## 2012-10-23 ENCOUNTER — Emergency Department (HOSPITAL_BASED_OUTPATIENT_CLINIC_OR_DEPARTMENT_OTHER)
Admission: EM | Admit: 2012-10-23 | Discharge: 2012-10-23 | Disposition: A | Discharge status: Home / Self Care | Payer: Self-pay | Attending: Emergency Medicine | Admitting: Emergency Medicine

## 2012-10-23 ENCOUNTER — Emergency Department (HOSPITAL_BASED_OUTPATIENT_CLINIC_OR_DEPARTMENT_OTHER): Payer: Self-pay

## 2012-10-23 ENCOUNTER — Encounter (HOSPITAL_BASED_OUTPATIENT_CLINIC_OR_DEPARTMENT_OTHER): Payer: Self-pay | Admitting: *Deleted

## 2012-10-23 DIAGNOSIS — I10 Essential (primary) hypertension: Secondary | ICD-10-CM | POA: Insufficient documentation

## 2012-10-23 DIAGNOSIS — Z8719 Personal history of other diseases of the digestive system: Secondary | ICD-10-CM | POA: Insufficient documentation

## 2012-10-23 DIAGNOSIS — R51 Headache: Secondary | ICD-10-CM | POA: Insufficient documentation

## 2012-10-23 DIAGNOSIS — Z7982 Long term (current) use of aspirin: Secondary | ICD-10-CM | POA: Insufficient documentation

## 2012-10-23 DIAGNOSIS — Y929 Unspecified place or not applicable: Secondary | ICD-10-CM | POA: Insufficient documentation

## 2012-10-23 DIAGNOSIS — I059 Rheumatic mitral valve disease, unspecified: Secondary | ICD-10-CM | POA: Insufficient documentation

## 2012-10-23 DIAGNOSIS — E785 Hyperlipidemia, unspecified: Secondary | ICD-10-CM | POA: Insufficient documentation

## 2012-10-23 DIAGNOSIS — R079 Chest pain, unspecified: Secondary | ICD-10-CM | POA: Insufficient documentation

## 2012-10-23 DIAGNOSIS — Y9301 Activity, walking, marching and hiking: Secondary | ICD-10-CM | POA: Insufficient documentation

## 2012-10-23 DIAGNOSIS — S0181XA Laceration without foreign body of other part of head, initial encounter: Secondary | ICD-10-CM

## 2012-10-23 DIAGNOSIS — M542 Cervicalgia: Secondary | ICD-10-CM | POA: Insufficient documentation

## 2012-10-23 DIAGNOSIS — H9190 Unspecified hearing loss, unspecified ear: Secondary | ICD-10-CM | POA: Insufficient documentation

## 2012-10-23 DIAGNOSIS — W1789XA Other fall from one level to another, initial encounter: Secondary | ICD-10-CM | POA: Insufficient documentation

## 2012-10-23 DIAGNOSIS — S0990XA Unspecified injury of head, initial encounter: Secondary | ICD-10-CM | POA: Insufficient documentation

## 2012-10-23 DIAGNOSIS — S0180XA Unspecified open wound of other part of head, initial encounter: Secondary | ICD-10-CM | POA: Insufficient documentation

## 2012-10-23 MED ORDER — TETANUS-DIPHTH-ACELL PERTUSSIS 5-2.5-18.5 LF-MCG/0.5 IM SUSP
0.5000 mL | Freq: Once | INTRAMUSCULAR | Status: DC
  Filled 2012-10-23: qty 0.5

## 2012-10-23 MED ORDER — ACETAMINOPHEN 160 MG/5ML PO SOLN
650.0000 mg | Freq: Once | ORAL | Status: AC
  Administered 2012-10-23: 650 mg via ORAL
  Filled 2012-10-23: qty 20.3

## 2012-10-23 NOTE — ED Provider Notes (Signed)
History     CSN: [id number]  Arrival date & time 10/23/12  1340   First MD Initiated Contact with Patient 10/23/12 1436      Chief Complaint  Patient presents with  . Head Laceration    (Consider location/radiation/quality/duration/timing/severity/associated sxs/prior treatment) Patient is a 61 y.o. female presenting with fall. The history is provided by the patient. No language interpreter was used.  Fall The accident occurred 1 to 2 hours ago. The fall occurred while walking. She fell from a height of 1 to 2 ft. She landed on carpet. The point of impact was the head and left knee (ribs left). The pain is present in the head and left knee (left side of neck). The pain is at a severity of 5/10. The pain is moderate. Associated symptoms include headaches. The treatment provided moderate relief.    Past Medical History  Diagnosis Date  . Hearing loss     75%  . Swallowing difficulty     problems for second/need dentures  . Menopausal state   . Paroxysmal a-fib     status post ablation  . Dyslipidemia   . MVP (mitral valve prolapse)   . IBS (irritable bowel syndrome)     remote history  . Hypertension     Past Surgical History  Procedure Date  . Cesarean section 1971, 1973  . Nasal septum surgery 1977  . Pulmonary vein isolation 06-2006    procedure for AFIB /[facility]    Family History  Problem Relation Age of Onset  . Diabetes      grandmother  . Hypertension Mother   . Heart attack      father  . Heart attack      brother  . Stroke      aunt    History  Substance Use Topics  . Smoking status: Never Smoker   . Smokeless tobacco: Not on file  . Alcohol Use: No    OB History    Grav Para Term Preterm Abortions TAB SAB Ect Mult Living                  Review of Systems  HENT: Positive for neck pain.   Cardiovascular: Positive for chest pain.  Musculoskeletal: Negative for joint swelling.  Neurological: Positive for headaches.  All other  systems reviewed and are negative.    Allergies  Amoxicillin; Ampicillin; Azithromycin; Cephalexin; Clarithromycin; Codeine; Flecainide; and Penicillins  Home Medications   Current Outpatient Rx  Name  Route  Sig  Dispense  Refill  . ASPIRIN 81 MG PO TBEC   Oral   Take 81 mg by mouth daily.           Marland Kitchen METOPROLOL TARTRATE 25 MG PO TABS      1/2 tab po bid           BP 140/68  Pulse 63  Temp 98.4 F (36.9 C) (Oral)  Resp 16  Ht 5\' 4"  (1.626 m)  Wt 120 lb (54.432 kg)  BMI 20.60 kg/m2  SpO2 100%  Physical Exam  Nursing note and vitals reviewed. Constitutional: She is oriented to person, place, and time. She appears well-developed and well-nourished.  HENT:  Right Ear: External ear normal.  Left Ear: External ear normal.  Nose: Nose normal.  Mouth/Throat: Oropharynx is clear and moist.       1cm  Laceration above left eyelid  Eyes: Conjunctivae normal and EOM are normal. Pupils are equal, round, and reactive to light.  Neck: Normal range of motion. Neck supple.       Tender cervical spine   Cardiovascular: Normal rate and normal heart sounds.   Pulmonary/Chest: Effort normal.  Abdominal: Soft. Bowel sounds are normal.  Musculoskeletal:       Tender left ribs,  Abrasion left knee  Neurological: She is alert and oriented to person, place, and time. She has normal reflexes.  Skin: Skin is warm.  Psychiatric: She has a normal mood and affect.    ED Course  LACERATION REPAIR Date/Time: 10/23/2012 9:48 PM Performed by: Cheron Schaumann K Authorized by: Cheron Schaumann K Risks and benefits: risks, benefits and alternatives were discussed Consent given by: patient Time out: Immediately prior to procedure a "time out" was called to verify the correct patient, procedure, equipment, support staff and site/side marked as required. Body area: head/neck Laceration length: 1 cm Foreign bodies: no foreign bodies Tendon involvement: none Nerve involvement: none Vascular  damage: no Skin closure: glue Patient tolerance: Patient tolerated the procedure well with no immediate complications.   (including critical care time)  Labs Reviewed - No data to display Dg Ribs Unilateral W/chest Left  10/23/2012  *RADIOLOGY REPORT*  Clinical Data: Left lower rib pain following a fall.  LEFT RIBS AND CHEST - 3+ VIEW  Comparison: Chest and right ribs dated 07/07/2012.  Findings: The cardiac silhouette remains mildly enlarged.  Stable linear scarring at the medial left lung base.  Otherwise, the lungs remain hyperexpanded and clear.  Mild central peribronchial thickening.  No rib fracture or pneumothorax seen.  Thoracic spine degenerative changes and mild scoliosis.  IMPRESSION:  1.  No acute abnormality. 2.  Stable mild cardiomegaly and mild changes of COPD and chronic bronchitis.   Original Report Authenticated By: Beckie Salts, M.D.    Ct Head Wo Contrast  10/23/2012  *RADIOLOGY REPORT*  Clinical Data:   Trauma with laceration to left side of face/head.  CT HEAD WITHOUT CONTRAST  Technique: Contiguous axial images were obtained from the base of the skull through the vertex without contrast  Comparison: Head CT of 09/07/2005.  No prior cervical spine imaging.  Findings:  Bone windows demonstrate no significant soft tissue swelling.  Clear paranasal sinuses and mastoid air cells.  Soft tissue windows demonstrate no  mass lesion, hemorrhage, hydrocephalus, acute infarct, intra-axial, or extra-axial fluid collection.  IMPRESSION: No acute or post-traumatic deformity identified.  CT CERVICAL SPINE WITHOUT CONTRAST  Technique: Continous axial images were obtained of the cervical spine without contrast.  Sagittal and coronal reformats were constructed.  Findings:  Spinal visualization through the top of T2. Prevertebral soft tissues are within normal limits.  .    Biapical pleural parenchymal scarring at the apices.  Mild left-sided neural foraminal narrowing at C6-C7, secondary to  uncovertebral joint hypertrophy.Skull base intact.  Maintenance of vertebral body height and alignment.  Mild endplate osteophyte formation at C6-C7. Facet arthropathy on the right at C4-C5. Coronal reformats demonstrate a normal C1-C2 articulation.  IMPRESSION: No acute fracture or subluxation.  Spondylosis, relatively mild for age.   Original Report Authenticated By: Jeronimo Greaves, M.D.    Ct Cervical Spine Wo Contrast  10/23/2012  *RADIOLOGY REPORT*  Clinical Data:   Trauma with laceration to left side of face/head.  CT HEAD WITHOUT CONTRAST  Technique: Contiguous axial images were obtained from the base of the skull through the vertex without contrast  Comparison: Head CT of 09/07/2005.  No prior cervical spine imaging.  Findings:  Bone windows demonstrate no significant  soft tissue swelling.  Clear paranasal sinuses and mastoid air cells.  Soft tissue windows demonstrate no  mass lesion, hemorrhage, hydrocephalus, acute infarct, intra-axial, or extra-axial fluid collection.  IMPRESSION: No acute or post-traumatic deformity identified.  CT CERVICAL SPINE WITHOUT CONTRAST  Technique: Continous axial images were obtained of the cervical spine without contrast.  Sagittal and coronal reformats were constructed.  Findings:  Spinal visualization through the top of T2. Prevertebral soft tissues are within normal limits.  .    Biapical pleural parenchymal scarring at the apices.  Mild left-sided neural foraminal narrowing at C6-C7, secondary to uncovertebral joint hypertrophy.Skull base intact.  Maintenance of vertebral body height and alignment.  Mild endplate osteophyte formation at C6-C7. Facet arthropathy on the right at C4-C5. Coronal reformats demonstrate a normal C1-C2 articulation.  IMPRESSION: No acute fracture or subluxation.  Spondylosis, relatively mild for age.   Original Report Authenticated By: Jeronimo Greaves, M.D.    Dg Knee Complete 4 Views Left  10/23/2012  *RADIOLOGY REPORT*  Clinical Data: Left knee  pain following a fall.  LEFT KNEE - COMPLETE 4+ VIEW  Comparison: None.  Findings: Normal appearing bones and soft tissues without fracture, dislocation or effusion.  IMPRESSION: Normal examination.   Original Report Authenticated By: Beckie Salts, M.D.      1. Laceration of forehead       MDM  Pt able to ambulate to bathroom,  No fractures on head ct.   Pt's tetanus up to date.         Lonia Skinner [place], [place] 10/23/12 2149

## 2012-10-23 NOTE — ED Notes (Signed)
Pt states that she can not take tetanus-causes a rash-EDPA notified

## 2012-10-23 NOTE — ED Notes (Signed)
Pt c/o Fall with laceration above left eye

## 2012-10-27 ENCOUNTER — Encounter: Payer: Self-pay | Admitting: Family Medicine

## 2012-10-27 ENCOUNTER — Ambulatory Visit (INDEPENDENT_AMBULATORY_CARE_PROVIDER_SITE_OTHER): Payer: Self-pay | Admitting: Family Medicine

## 2012-10-27 VITALS — BP 109/62 | HR 67 | Wt 121.0 lb

## 2012-10-27 DIAGNOSIS — R079 Chest pain, unspecified: Secondary | ICD-10-CM

## 2012-10-27 DIAGNOSIS — S80219A Abrasion, unspecified knee, initial encounter: Secondary | ICD-10-CM

## 2012-10-27 DIAGNOSIS — S0180XA Unspecified open wound of other part of head, initial encounter: Secondary | ICD-10-CM

## 2012-10-27 DIAGNOSIS — IMO0002 Reserved for concepts with insufficient information to code with codable children: Secondary | ICD-10-CM

## 2012-10-27 DIAGNOSIS — R0781 Pleurodynia: Secondary | ICD-10-CM

## 2012-10-27 DIAGNOSIS — S0181XA Laceration without foreign body of other part of head, initial encounter: Secondary | ICD-10-CM

## 2012-10-27 MED ORDER — ACETAMINOPHEN 650 MG/20.3ML PO SUSP: 650.0000 mg | Freq: Four times a day (QID) | ORAL | Status: DC | PRN

## 2012-10-27 NOTE — Patient Instructions (Addendum)
Continue to ice the sore areas. As far as her knee is concerned stop using peroxide and just use Vaseline. He did not have to wear a Band-Aid over the lesion on her forehead. She try for at least 3 or 4 more days. At that point he can get it wet in the shower washing her face and then pat dry. Don't scrub the area.

## 2012-10-27 NOTE — Progress Notes (Signed)
  Subjective:    Patient ID: Stephanie Rollins, female    DOB: [birth year], 61 y.o.   MRN: [id number]  HPI  Larey Seat on 10/23/12. Went to [facility] ED. She fell on her left side.  Had laceration on the left temple.  Says her glasses had a metal frame adn when hit it cut her forehead.  She had normal knee and rib xrays and head CT.  Says her left hip is still tender. No problems walking.  Feel when step over a dog gate.  Using tyelnol at night and has been trying to move to avoid stiffness.  Hs felt a little nauseated and fatigue the last 2 days.   Review of Systems     Objective:   Physical Exam  Musculoskeletal:       Left shoulder with NROM.  Anterior shoulder is tender.  Tender over the left lower anterior ribs. No bruising.  Tender over the left trochanteric head. No bruising.  Hip with NROM.    Neurological: No cranial nerve deficit.  Skin:       Left forehead with laceration. Dermabond intact. No drainage.  Area still swollen and bruising but healing well.  Abrasion on the left knee is about 2cm and circular. Healing well overall.           Assessment & Plan:  Laceration to forehead - Healing very well.  Call if any concern for infection. Still swollen so continue to ice it  Abrasion to the left knee - healing well. Stop using peroxide. Just use soap and water and can apply vaseline.    Rib contusion - no fracture.  Did hear a pop this AM. Gave her reassurance. Expect to be sore for 2-3 more weeks. If still struggling with pain at that time then she can come back in for evaluation.

## 2012-10-27 NOTE — ED Provider Notes (Signed)
Medical screening examination/treatment/procedure(s) were performed by non-physician practitioner and as supervising physician I was immediately available for consultation/collaboration.  Hurman Horn, MD 10/27/12 [phone]

## 2013-06-26 ENCOUNTER — Other Ambulatory Visit: Payer: Self-pay | Admitting: Family Medicine

## 2013-07-09 IMAGING — CT CT HEAD W/O CM
4 of 5 series · 15 of 47 positions shown, 16 images · non-contrast
Comparison: Head CT of 09/07/2005.  No prior cervical spine
imaging.

CLINICAL DATA: Trauma with laceration to left side of face/head.

CT HEAD WITHOUT CONTRAST
TECHNIQUE: Contiguous axial images were obtained from the base of
the skull through the vertex without contrast
TECHNIQUE: Continous axial images were obtained of the cervical
spine without contrast.  Sagittal and coronal reformats were
constructed.

[Series 2: head 4.8 h37s · axial · 0.42mm/px · z∈[+22,+70]mm · 2 of 32 slices shown, 3 images]
[im 11/32  brain]
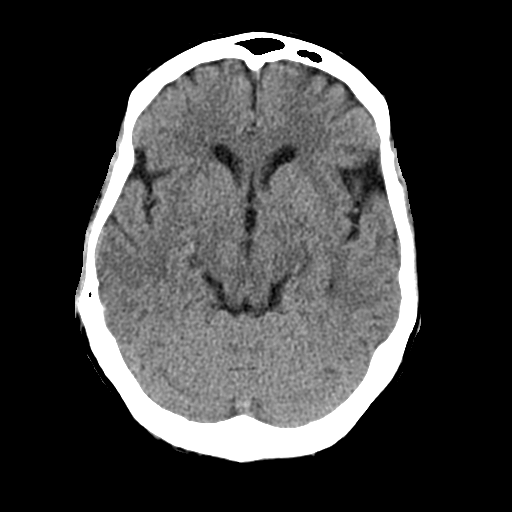
[im 11/32  bone]
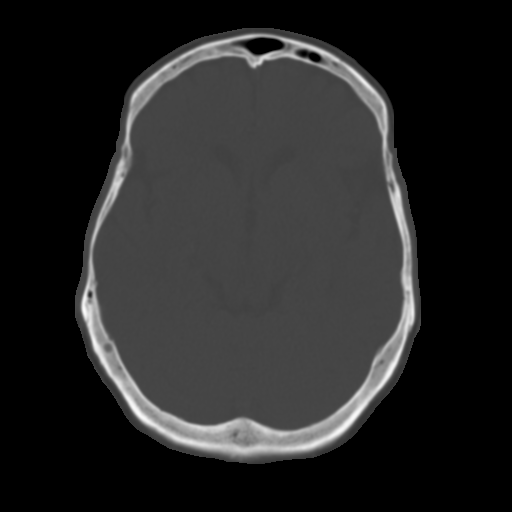
[im 21/32  brain]
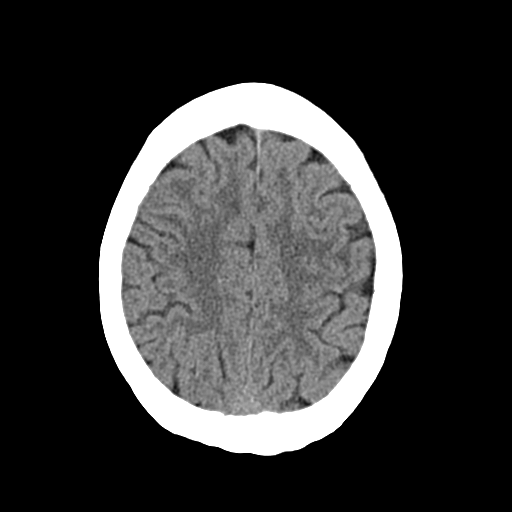

[Series 5: c_spine 2.0 b41s st · axial · 0.29mm/px · z∈[-150,-32]mm · 7 of 89 slices shown]
[im 8/89  brain]
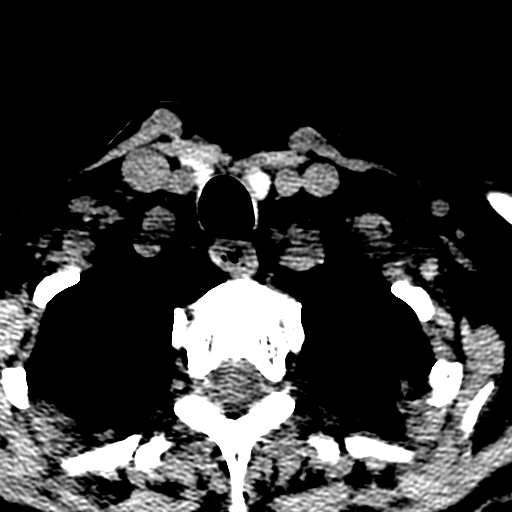
[im 23/89  brain]
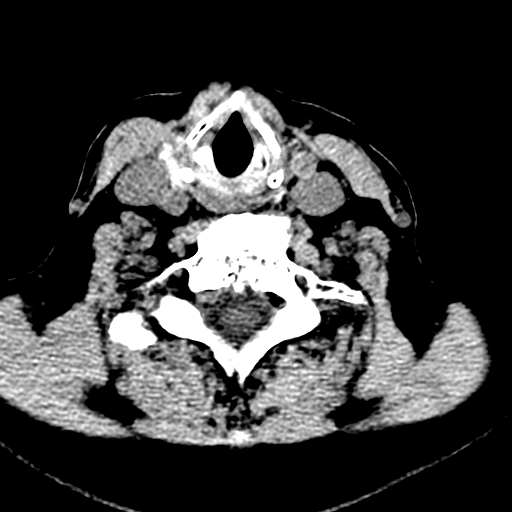
[im 30/89  brain]
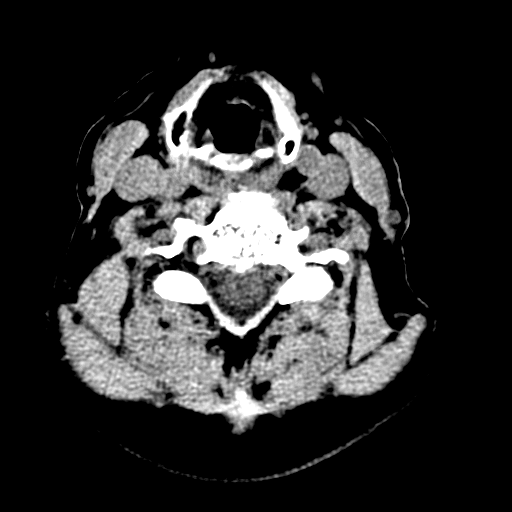
[im 37/89  brain]
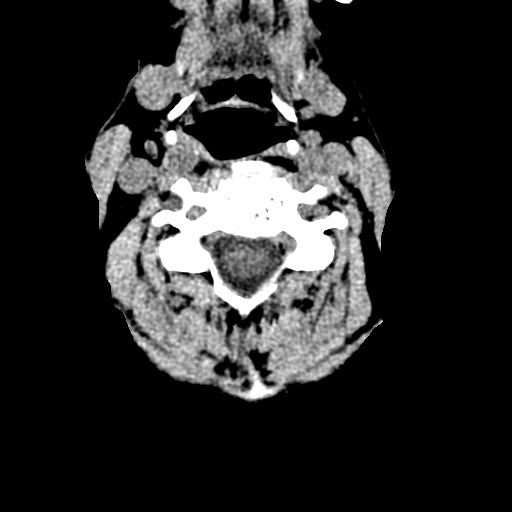
[im 52/89  brain]
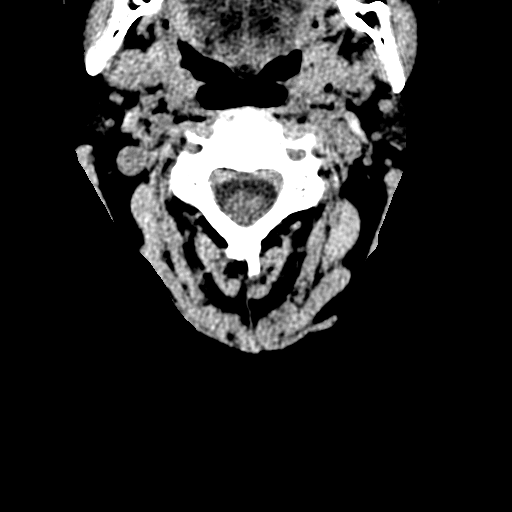
[im 59/89  brain]
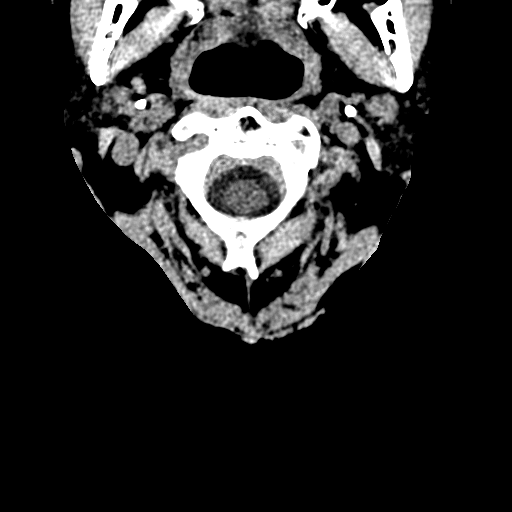
[im 67/89  brain]
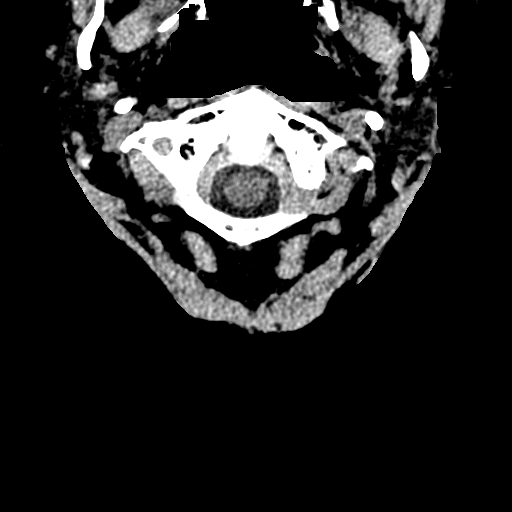

[Series 8: c_spine 2.0 coronal · coronal · 0.26mm/px · 3 of 53 slices shown]
[im 18/53  brain]
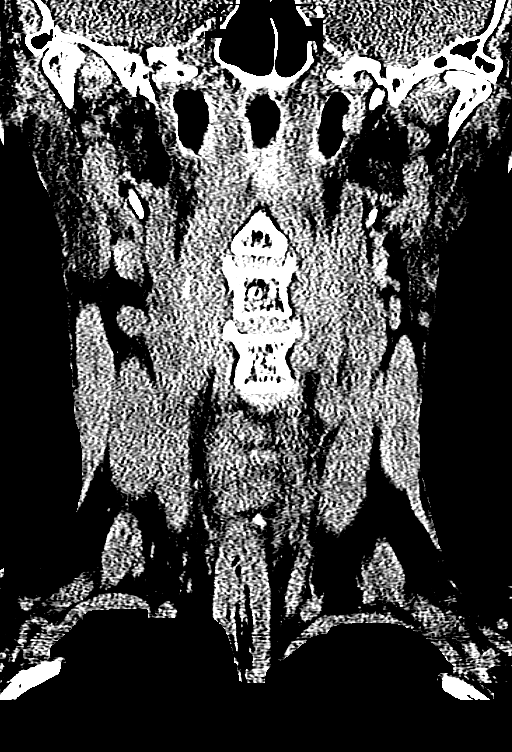
[im 24/53  brain]
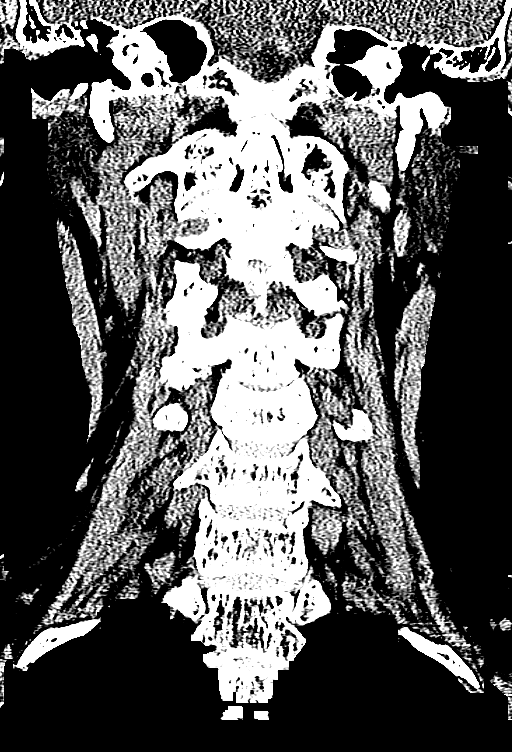
[im 29/53  brain]
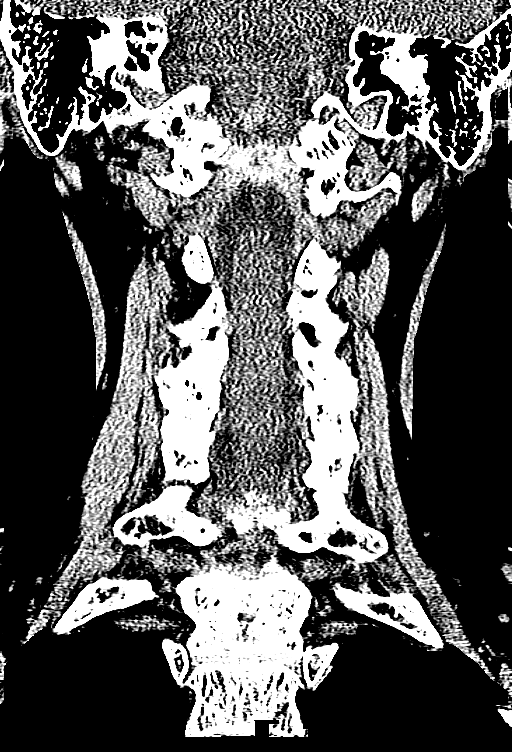

[Series 9: c_spine 2.0 sagittal · sagittal · 0.23mm/px · 3 of 68 slices shown]
[im 23/68  brain]
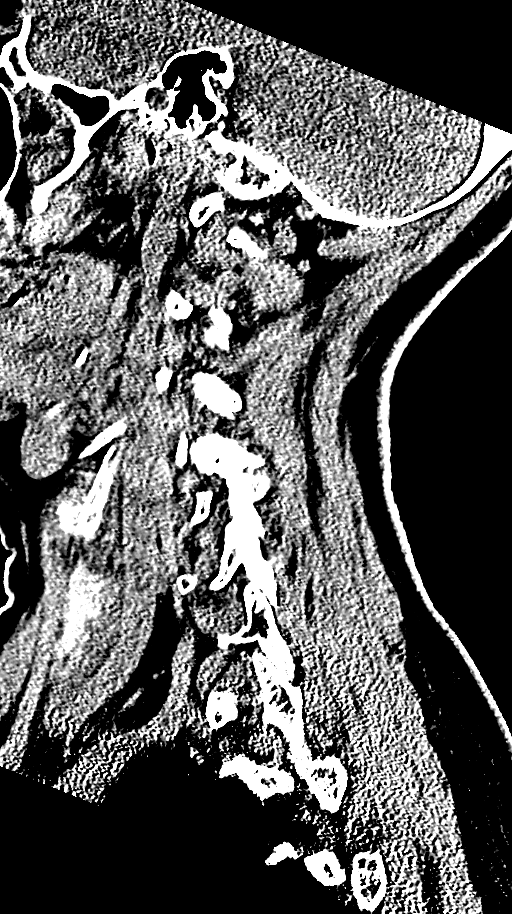
[im 34/68  brain]
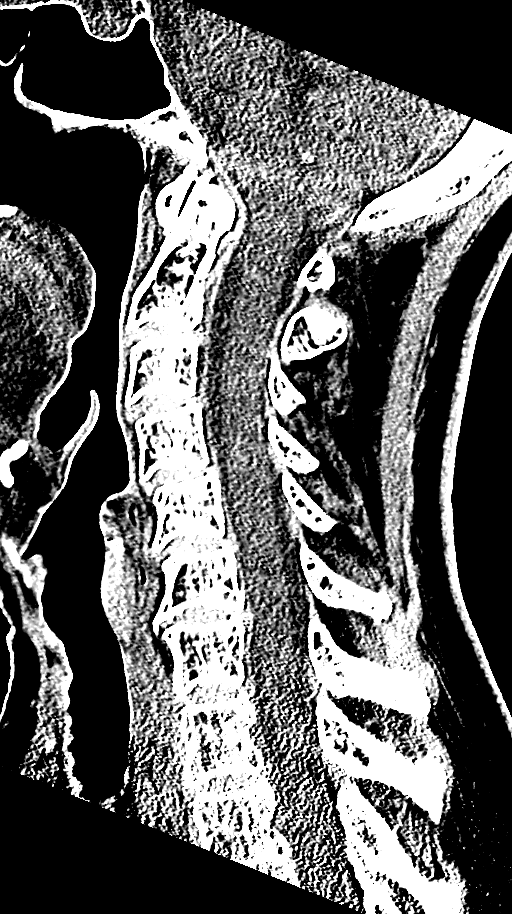
[im 45/68  brain]
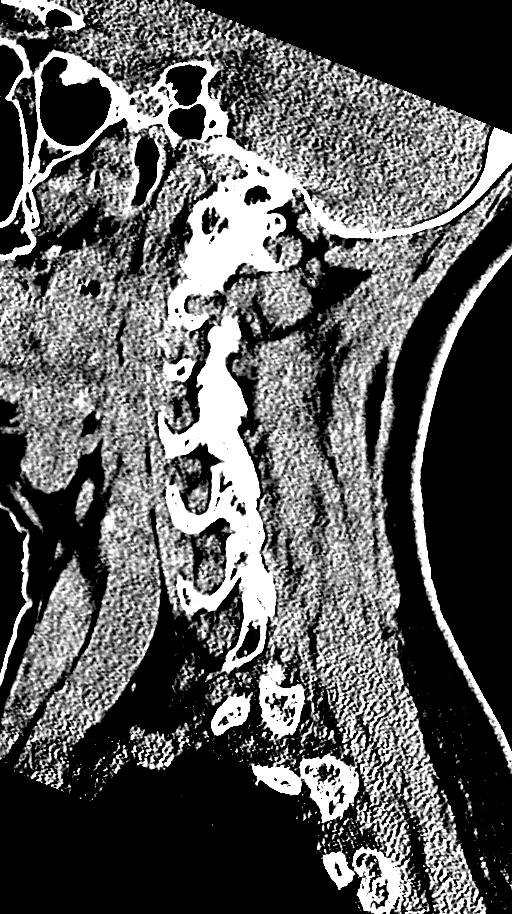

[15 of 47 positions shown; findings below may reference images not displayed]

FINDINGS: Bone windows demonstrate no significant soft tissue
swelling.  Clear paranasal sinuses and mastoid air cells.

Soft tissue windows demonstrate no  mass lesion, hemorrhage,
hydrocephalus, acute infarct, intra-axial, or extra-axial fluid
collection.
IMPRESSION: No acute or post-traumatic deformity identified.

CT CERVICAL SPINE WITHOUT CONTRAST
FINDINGS: Spinal visualization through the top of T2. Prevertebral
soft tissues are within normal limits.  .    Biapical pleural
parenchymal scarring at the apices.  Mild left-sided neural
foraminal narrowing at C6-C7, secondary to uncovertebral joint
hypertrophy.Skull base intact.  Maintenance of vertebral body
height and alignment.  Mild endplate osteophyte formation at C6-C7.
Facet arthropathy on the right at C4-C5. Coronal reformats
demonstrate a normal C1-C2 articulation.
IMPRESSION: No acute fracture or subluxation.

Spondylosis, relatively mild for age.

## 2013-08-08 ENCOUNTER — Ambulatory Visit: Payer: Medicaid Other | Admitting: Family Medicine

## 2013-08-08 ENCOUNTER — Encounter: Payer: Self-pay | Admitting: Family Medicine

## 2013-08-08 VITALS — BP 108/62 | HR 57 | Wt 119.0 lb

## 2013-08-08 DIAGNOSIS — R002 Palpitations: Secondary | ICD-10-CM

## 2013-08-08 DIAGNOSIS — R5383 Other fatigue: Secondary | ICD-10-CM

## 2013-08-08 DIAGNOSIS — R5381 Other malaise: Secondary | ICD-10-CM

## 2013-08-08 NOTE — Progress Notes (Addendum)
CC: Stephanie Rollins is a 62 y.o. female is here for Fatigue, Weight Loss and Palpitations   Subjective: HPI:  Patient reports palpitations that have been worsening over the last one-2 months described as moderate in severity occurring multiple times during the day most problematic in the evening while trying to go to sleep or lying on her left side. Nothing else particularly makes themworse or better. She is quite a good historian and shares her history of mitral valve prolapse with treatment including multiple beta blockers and calcium channel blockers in the distant past which caused intolerable hypotension and bradycardia. Additionally she shares with me her history of atrial fibrillation which was unable to be rate controlled by beta blockers and calcium channel blockers ultimately requiring ablation sometime around 2007. Since then she has had daily palpitations which were not interfering with quality of life up until 1 patch 2 weeks ago. When the palpitations occur she states she feels like she's being shocked almost like what she would expect a defibrillator would feel like. She is still able walk half a mile carrying trash cans without shortness of breath chest pain or worsening of palpitations however she does feel more fatigued than what she considers normal after doing this. She reports objective anxiety with worsening frequency of her palpitations. Over the past 6 months she reports worsening fatigue all hours of the day, she spends the majority of her hours at home resting and reading but would like to be more active if she had the energy/motivation.  She brings in a quite detailed blood pressure and pulse diary over the past month with pulses ranging between 50-71 blood pressures no greater than 110/60. She continues on metoprolol 25 mg twice a day she tells me she is under the impression that her cardiologist does not want her to decrease this  She denies fevers, chills, cough, chest pain,  shortness of breath, orthopnea, peripheral edema, confusion, motor or sensory disturbances  Review Of Systems Outlined In HPI  Past Medical History  Diagnosis Date  . Hearing loss     75%  . Swallowing difficulty     problems for second/need dentures  . Menopausal state   . Paroxysmal a-fib     status post ablation  . Dyslipidemia   . MVP (mitral valve prolapse)   . IBS (irritable bowel syndrome)     remote history  . Hypertension      Family History  Problem Relation Age of Onset  . Diabetes      grandmother  . Hypertension Mother   . Heart attack      father  . Heart attack      brother  . Stroke      aunt     History  Substance Use Topics  . Smoking status: Never Smoker   . Smokeless tobacco: Not on file  . Alcohol Use: No     Objective: Filed Vitals:   08/08/13 1516  BP: 108/62  Pulse: 57    General: Alert and Oriented, No Acute Distress HEENT: Pupils equal, round, reactive to light. Conjunctivae clear.  Moist mucous membranes pharynx unremarkable neck is supple without palpable thyromegaly or nodularity Lungs: Clear to auscultation bilaterally, no wheezing/ronchi/rales.  Comfortable work of breathing. Good air movement. Cardiac: Regular rate and rhythm. Normal S1/S2.  No murmurs, rubs, nor gallops.   Abdomen: Soft nontender Extremities: No peripheral edema.  Strong peripheral pulses.  Mental Status: No depression, anxiety, nor agitation. Skin: Warm and dry.  Assessment & Plan: Stephanie Rollins was seen today for fatigue, weight loss and palpitations.  Diagnoses and associated orders for this visit:  Palpitations - PR ELECTROCARDIOGRAM, COMPLETE - TSH - BASIC METABOLIC PANEL WITH GFR - TSH - CBC - Holter monitor - 48 hour; Future  Fatigue - TSH - BASIC METABOLIC PANEL WITH GFR - TSH - CBC    Palpitations: EKG was obtained today showing sinus bradycardia normal intervals normal QRS morphology, no pathologic Q waves, normal axis, no abnormal T  wave morphology nor ST segment elevation or depression. We will rule out thyroid or electrolyte abnormality with the above labs in addition to anemia. Since she is having daily symptoms multiple times a day I believe a Holter monitor is warranted to rule out any pathologic arrhythmia beyond her known benign PVCs. Fatigue: Similar initial workup as above with blood work, I suspect a component of her fatigue is due to bradycardia secondary to metoprolol however I would expect decreasing this would worsen her palpitations which is her chief complaint  40 minutes spent face-to-face during visit today of which at least 50% was counseling or coordinating care regarding palpitations, fatigue.   Return if symptoms worsen or fail to improve.

## 2013-08-09 ENCOUNTER — Encounter: Payer: Self-pay | Admitting: *Deleted

## 2013-08-09 LAB — CBC
MCH: 28.5 pg (ref 26.0–34.0)
MCV: 86.9 fL (ref 78.0–100.0)
Platelets: 206 10*3/uL (ref 150–400)
RDW: 13.4 % (ref 11.5–15.5)

## 2013-08-09 LAB — BASIC METABOLIC PANEL WITH GFR
BUN: 10 mg/dL (ref 6–23)
Calcium: 9.7 mg/dL (ref 8.4–10.5)
GFR, Est African American: >89 mL/min
Glucose, Bld: 99 mg/dL (ref 70–99)
Sodium: 142 mEq/L (ref 135–145)

## 2013-08-24 ENCOUNTER — Ambulatory Visit: Payer: Medicaid Other | Admitting: Family Medicine

## 2013-08-24 ENCOUNTER — Encounter: Payer: Self-pay | Admitting: Family Medicine

## 2013-08-24 VITALS — BP 111/61 | HR 68 | Temp 98.1°F | Wt 120.0 lb

## 2013-08-24 DIAGNOSIS — R002 Palpitations: Secondary | ICD-10-CM

## 2013-08-24 DIAGNOSIS — R05 Cough: Secondary | ICD-10-CM

## 2013-08-24 DIAGNOSIS — J069 Acute upper respiratory infection, unspecified: Secondary | ICD-10-CM

## 2013-08-24 DIAGNOSIS — D649 Anemia, unspecified: Secondary | ICD-10-CM

## 2013-08-24 LAB — CBC WITH DIFFERENTIAL/PLATELET
Basophils Relative: 0 % (ref 0–1)
Eosinophils Absolute: 0.1 10*3/uL (ref 0.0–0.7)
MCH: 29.5 pg (ref 26.0–34.0)
MCHC: 34.2 g/dL (ref 30.0–36.0)
Neutrophils Relative %: 52 % (ref 43–77)
Platelets: 239 10*3/uL (ref 150–400)

## 2013-08-24 LAB — FERRITIN: Ferritin: 115 ng/mL (ref 10–291)

## 2013-08-24 NOTE — Patient Instructions (Addendum)
Schedule spirometry for next week.  Call if your cough is worse or you have a fever.

## 2013-08-24 NOTE — Progress Notes (Signed)
Subjective:    Patient ID: Stephanie Rollins, female    DOB: 1951-10-16, 62 y.o.   MRN: [id number]  HPI Cough x3-4 days. +productive. No fever.  Last night started getting some chest discomfort and started to cough more today.  Sputum is clear.    Has had a lot of sinus pressure and HA.  No ST.  Bilat ear pressure.    Has been feeling a boom in her chest occ.  Saw Dr. Ivan Anchors.  Did labs that were normal.  Hx of afib but has had ablation years ago.  Then Tuesday night had heart racing and went to ED.  HR was around 108. She says she immediately took a dose of metoprolol that time she got to the emergency department her heart rate had come back down to the 70s. She's not sure exactly what happened or triggered this. Had neg bloodwork there and neg CXR.  They are trying ot see if they can set her up for a holter monitor through: Since she does get some financial assistance through: Health. She denies any actual chest pain or diaphoresis. Did review the blood work that was ordered by Dr. Ivan Anchors. I also reviewed the blood work that she had done in the emergency department. She does not drink caffeine or take any other stimulants.  He also brought up the fact that when looking on my chart she saw an old chest x-ray that noted that she had an enlarged heart as well as some evidence of COPD. She want to discuss that today.  Review of Systems     Objective:   Physical Exam  Constitutional: She is oriented to person, place, and time. She appears well-developed and well-nourished.  HENT:  Head: Normocephalic and atraumatic.  Right Ear: External ear normal.  Left Ear: External ear normal.  Nose: Nose normal.  Mouth/Throat: Oropharynx is clear and moist.  TMs and canals are clear.   Eyes: Conjunctivae and EOM are normal. Pupils are equal, round, and reactive to light.  Neck: Neck supple. No thyromegaly present.  Cardiovascular: Normal rate, regular rhythm and normal heart sounds.   Pulmonary/Chest:  Effort normal and breath sounds normal. She has no wheezes.  Lymphadenopathy:    She has no cervical adenopathy.  Neurological: She is alert and oriented to person, place, and time.  Skin: Skin is warm and dry.  Psychiatric: She has a normal mood and affect.          Assessment & Plan:  URI-I. suspect her symptoms are most likely viral. There's nothing on exam today consistent with acute bacterial infection. She had a chest x-ray done 2 days ago that was normal. We will go ahead and repeat her CBC today to make sure that looks okay. Her white count was around 7.3  two days ago. Her hemoglobin was mildly low. Certainly if she gets worse, develops a fever or is not improving then consider further evaluation and possible treatment with a course of antibiotics. So consider that this could be related to her allergies. She does have a history of allergic rhinitis is not currently taking any medications for this. She says they tend to bother her heart.  Anemia-hemoglobin was low at the emergency department. We will repeat a CBC today and after, folate and B12 level and call her with the results once available  COPD on xray - will schedule for spirometry fr some time next week.  Peak flow in the yellos zone.  Had pulmonary function testing  done about 10 yr ago that she reports was normal.    Palpitation-it's unclear to me what exactly is causing her symptoms. I she describes it as a boom in her chest that is not necessarily related to her heartbeat. She says she can even here in her ear sometimes. everything looks reassuring so far. Labwork is normal electrolytes are normal. She did have an episode of tachycardia on Tuesday. I'm not sure what may have triggered this. She did get a MRSA department and she was not in atrial fibrillation. Her heart rate in fact was back to normal by the time she got there. She does have a history of PACs and PVCs as well.  Cardiomegaly on a chest x-ray-she did have a  fairly normal echocardiogram done back in 2008 after her ablation for her atrial fibrillation.

## 2013-08-27 ENCOUNTER — Encounter: Payer: Self-pay | Admitting: *Deleted

## 2013-08-27 ENCOUNTER — Other Ambulatory Visit: Payer: Self-pay | Admitting: Family Medicine

## 2013-08-28 ENCOUNTER — Telehealth: Payer: Self-pay | Admitting: Family Medicine

## 2013-08-28 NOTE — Telephone Encounter (Signed)
Unable to schedule monitor at this time.  Spoke with Babette Relic and Elease Hashimoto regarding patient being self pay.   Explain to the patient she will have to pay out of pocket for the monitor or I could send her the hardship application to fill out and return to me.  Once she has done so, I will fax it to [employer] to see if she qualify for there program.  Application mailed today.

## 2013-08-28 NOTE — Telephone Encounter (Signed)
Patient has been referred by Dr Ivan Anchors for Holter Moniter. Appointment has not been set up with [employer] as yet because patient will be self pay and cannot afford to pay out of pocket($600.00). Jasmine December from [facility] is sending paperwork to patient so that she can apply for financial assistance; paperwork will  take one to two weeks to process. Pt is aware.

## 2013-08-30 ENCOUNTER — Ambulatory Visit: Payer: Medicaid Other | Admitting: Family Medicine

## 2013-08-30 ENCOUNTER — Encounter: Payer: Self-pay | Admitting: Family Medicine

## 2013-08-30 VITALS — BP 109/56 | HR 74 | Ht 64.0 in | Wt 121.0 lb

## 2013-08-30 DIAGNOSIS — F430 Acute stress reaction: Secondary | ICD-10-CM

## 2013-08-30 DIAGNOSIS — R918 Other nonspecific abnormal finding of lung field: Secondary | ICD-10-CM

## 2013-08-30 DIAGNOSIS — R9389 Abnormal findings on diagnostic imaging of other specified body structures: Secondary | ICD-10-CM

## 2013-08-30 DIAGNOSIS — R002 Palpitations: Secondary | ICD-10-CM

## 2013-08-30 LAB — PULMONARY FUNCTION TEST

## 2013-08-30 MED ORDER — ALBUTEROL SULFATE (2.5 MG/3ML) 0.083% IN NEBU
2.5000 mg | INHALATION_SOLUTION | Freq: Once | RESPIRATORY_TRACT | Status: AC
  Administered 2013-08-30: 2.5 mg via RESPIRATORY_TRACT

## 2013-08-30 MED ORDER — ALPRAZOLAM 0.25 MG PO TABS: 0.2500 mg | ORAL_TABLET | Freq: Every day | ORAL | Status: DC | PRN

## 2013-08-30 NOTE — Progress Notes (Signed)
  Subjective:    Patient ID: Stephanie Rollins, female    DOB: 1951/09/03, 62 y.o.   MRN: [id number]  HPI She is here today for spirometry. She had a chest x-ray recently that suggested she had COPD type changes. She is here for more formal evaluation. She had had pulmonary function test about 10 years ago that was normal. Brought CXR with her today from [facility] ED.    Palpitations-she did note that one of her blood pressure medications had changed. She changed pharmacies and they gave her a different brand. This was about 2 weeks before she started experiencing symptoms. She has since switched back to her old medication went back to her old pharmacy. And she actually has felt a little bit better the last 2 days.  Anxiety/acute stress-she says she's been under a lot of stress lately. She no longer has the car to be able to drive and so is relying on other people to drive her. She says she's very independent person Mrs. been very stressful for her. She's also had some family members with some health issues that have been concerning. She does request a refill on her Xanax which she had for about 3 years ago in case she needs it. Review of Systems     Objective:   Physical Exam  Constitutional: She is oriented to person, place, and time. She appears well-developed and well-nourished.  HENT:  Head: Normocephalic and atraumatic.  Neurological: She is alert and oriented to person, place, and time.  Skin: Skin is warm and dry.  Psychiatric: She has a normal mood and affect. Her behavior is normal.          Assessment & Plan:  Spirometry is normal. FVC is 97%, FEV1 is 98% and ratio is 81%. This is perfectly normal. She had poor performance on the second portion of his medication. She says she just fatigued out. And also look at her chest x-ray that was performed at the hospital. It was a portable x-ray that did show some mild hyperaeration. As well as some rounding of the curvature of the heart. No  lesions or nodules or sign of infection. I gave her reassurance.  Palpitations-she still working on getting a Holter monitor. She had an episode of tachycardia that she went to the emergency room for is very concerned that it could be return of her atrial fibrillation. That makes her very nervous and anxious. I tried to give her reassurance. Unfortunately has not her control. She was up to wait and see what happens over time. As far as the occasional abrupt beats that she is experiencing I do think the Holter monitor would help determine if these are PACs or PVCs. Study of her reassurance about this as well.  I did not schedule a followup for now as the next I think is getting a Holter monitor and she does not have health insurance.  Acute situational stress-we'll go ahead and refill for her Xanax, 15 tabs to use as needed.

## 2013-09-15 ENCOUNTER — Encounter: Payer: Self-pay | Admitting: Family Medicine

## 2013-09-19 ENCOUNTER — Telehealth: Payer: Self-pay | Admitting: Family Medicine

## 2013-09-19 NOTE — Telephone Encounter (Signed)
09-19-13  Patient enrolled to [employer] for [employer].  Spoke with Bubba Hales to let her know I was faxing [employer] application.

## 2013-09-27 ENCOUNTER — Encounter: Payer: Self-pay | Admitting: Cardiology

## 2013-09-27 ENCOUNTER — Encounter (INDEPENDENT_AMBULATORY_CARE_PROVIDER_SITE_OTHER): Payer: Medicaid Other

## 2013-09-27 DIAGNOSIS — R002 Palpitations: Secondary | ICD-10-CM

## 2013-10-30 ENCOUNTER — Encounter: Payer: Self-pay | Admitting: Family Medicine

## 2013-11-08 ENCOUNTER — Telehealth: Payer: Self-pay | Admitting: *Deleted

## 2013-11-08 NOTE — Telephone Encounter (Signed)
Pt left message asking if you have received the results of her holter monitor.  Please advise

## 2013-11-09 NOTE — Telephone Encounter (Signed)
When did she go for it? I don't see anything scanned in.

## 2013-11-16 ENCOUNTER — Telehealth: Payer: Self-pay | Admitting: *Deleted

## 2013-11-16 NOTE — Telephone Encounter (Signed)
Left message for pt to call, monitor reviewed by dr Jens Som shows sinus with PAC's, brief PAT and one breif run of atrial fib. No change.

## 2013-11-16 NOTE — Telephone Encounter (Signed)
LMOM for pt to return my call.

## 2013-11-19 ENCOUNTER — Telehealth: Payer: Self-pay | Admitting: *Deleted

## 2013-11-19 NOTE — Telephone Encounter (Signed)
Pt would like to have a copy of her monitor results/ pt was told that medical records will call her back to arrange a date to sign release and pick up/ pt agreed to plan. Pt would like to Dr Jens Som / Stanton Kidney Rn to call her next week to further advise if she needs to f/u with Dr Graciela Husbands. Please advise.

## 2013-11-19 NOTE — Telephone Encounter (Signed)
Patient would like results of monitor, please call patient. She had a episode on Saturday of the A-fib? Please call advise.

## 2013-11-19 NOTE — Telephone Encounter (Signed)
Spoke With Pt she Is Going to [employer] to [employer] up [employer]. Since she  Lives in [place].

## 2013-11-19 NOTE — Telephone Encounter (Signed)
Pt called and wanted to know the results of her 30 day event monitor because she had not heard anything from dr.crenshaw's ofc. I read back the phone note that was left for her on 12/26 and she stated that she had called their office however she was unable to talk with anyone regarding her results due to them being on vacation. She stated that she had an episode on Saturday where she had palpitations and a headache and had begun sweating and it had made her nervous due to her previous hx. I asked if there was any other nurse there that could speak with her and she stated that she left message with triage nurse for a return call. Pt asked for a copy of the report to be sent to her,. Report printed and sent.Stephanie Rollins

## 2013-11-21 ENCOUNTER — Telehealth: Payer: Self-pay | Admitting: Family Medicine

## 2013-11-21 NOTE — Telephone Encounter (Signed)
lvm for pt to return call.Stephanie Rollins  

## 2013-11-21 NOTE — Telephone Encounter (Signed)
Please call patient and let her know that the cardiac monitor showed some PACs which are early beats. And a small run of atrial fibrillation. We could increase her metoprolol to 50 mg twice a day and see if she tolerates this. This would help control the symptoms. If the other option would be to get her back in with Dr. Jens Som who comes here in our building and have a consult with him.

## 2013-11-23 NOTE — Telephone Encounter (Signed)
Stephanie Rollins  

## 2013-11-23 NOTE — Telephone Encounter (Signed)
Pt informed.she is concerned about the a-fib episodes she is currently taking metoprolol 25mg  BID. She currently sees dr.crenshaw and stated that she will call dr. Ronnald Niancreshaw's ofc on Monday since they have been out for vacation to set up an appt for f/u.[name], Risha Barretta BuckhannonLynetta

## 2013-11-29 NOTE — Telephone Encounter (Signed)
Pt already given results  °

## 2013-11-29 NOTE — Telephone Encounter (Signed)
Left another vm asking pt to return my call.

## 2013-12-04 ENCOUNTER — Telehealth: Payer: Self-pay | Admitting: Cardiology

## 2013-12-04 NOTE — Telephone Encounter (Signed)
New message ° ° °Patient returning your call. °

## 2013-12-04 NOTE — Telephone Encounter (Signed)
Spoke with pt, aware we have received the release and I will be able to bring the copy of the monitor to [facility] tomorrow afternoon for her to pick up. The pt also would like dr Graciela Husbandsklein to look over the monitor. She saw him in the past and was sent to [facility] for an ablation. She wants to know if dr Graciela Husbandsklein thinks she is at high risk for the atrial fib to return. She has not seen dr Graciela Husbandsklein since 2008 and I explained to the pt dr Graciela Husbandsklein may not be able to address her concerns without seeing her and discussing her symptoms. She voiced understanding but she lives in [place] and does not have a car and no transportation. Will take the monitor to dr Graciela Husbandsklein and ask him to review. The pt understands she may need to be seen.

## 2013-12-04 NOTE — Telephone Encounter (Signed)
Left message for pt, we will be in the [place] office tomorrow, want to make sure she got the results she wanted. She will call back if needed.

## 2013-12-17 ENCOUNTER — Telehealth: Payer: Self-pay | Admitting: Cardiology

## 2013-12-17 NOTE — Telephone Encounter (Signed)
New problem   Pt need to speak to you concerning her a-fib/aflutter and stated you had discussed with her getting her an  appt with Dr Graciela HusbandsKlein.

## 2013-12-17 NOTE — Telephone Encounter (Signed)
Spoke with pt, she is having more frequent bouts of palpitations and she is worried about atrial fib. She has seen dr Graciela Husbandsklein in the past and wanted to see about seeing him again for reassurance. Will discuss with dr Odessa Flemingklein's nurse regarding an appt.

## 2013-12-19 NOTE — Telephone Encounter (Signed)
Pt scheduled to see Dr. Graciela HusbandsKlein on Monday 2/2.

## 2013-12-24 ENCOUNTER — Ambulatory Visit (INDEPENDENT_AMBULATORY_CARE_PROVIDER_SITE_OTHER): Payer: Self-pay | Admitting: Internal Medicine

## 2013-12-24 ENCOUNTER — Encounter: Payer: Self-pay | Admitting: Internal Medicine

## 2013-12-24 VITALS — BP 146/69 | HR 75 | Ht 64.0 in | Wt 117.4 lb

## 2013-12-24 DIAGNOSIS — I4891 Unspecified atrial fibrillation: Secondary | ICD-10-CM

## 2013-12-24 DIAGNOSIS — I4949 Other premature depolarization: Secondary | ICD-10-CM

## 2013-12-24 DIAGNOSIS — R002 Palpitations: Secondary | ICD-10-CM

## 2013-12-24 DIAGNOSIS — R079 Chest pain, unspecified: Secondary | ICD-10-CM | POA: Insufficient documentation

## 2013-12-24 NOTE — Assessment & Plan Note (Signed)
She has chest pain with typical and atypical symptoms. She falls positive Myoview about a decade ago; hence I think our options are either stress echo or CT scanning ; we will try the former. Will also take the opportunity to reassess her mitral valve

## 2013-12-24 NOTE — Assessment & Plan Note (Signed)
Clearly a major component. I have also asked her to check with her PCP and her family asked the question if she depressed. She is home bound largely. It certainly will have a major impact on her perception of her cardiac condition

## 2013-12-24 NOTE — Assessment & Plan Note (Signed)
30 day event recorder demonstrated no atrial fibrillation. She did  have to atrial runs lasting 5-10 seconds. She had PACs. She also had multiple episodes of palpitations unassociated with activity.  This suggests a couple of issues. The first is that anxiety and hyper awareness of her heartbeat are a major part of her problem, and that even if we were to suppress or eliminate her arrhythmia she would still have multiple symptoms. Hence, I think either encouragement that these are not life threatening and no evidence of atrial fibrillation seen and or biofeedback and/or anxiolysis is probably our preferred treatment strategy.  Alternatively an antiarrhythmic drug could be utilized; she previously did not tolerate flecainide. Either Rythmol, dronaderone or amiodarone would have some tendency to aggravate her bradycardia episode like to avoid these.  I reassured her that there is nothing life-threatening and no evidence of atrial fibrillation to  Prompt concern regarding stroke at this time.

## 2013-12-24 NOTE — Progress Notes (Signed)
ELECTROPHYSIOLOGY CONSULT NOTE  Patient ID: Stephanie Rollins, MRN: [id number], DOB/AGE: 07-29-1951 63 y.o. Admit date: (Not on file) Date of Consult: 12/24/2013  Primary Physician: Nani Gasser, MD Primary Cardiologist:  Chief Complaint: palpitatoins   HPI Stephanie Rollins is a 63 y.o. female  With a known history of atrial arrhythmias with fibrillation and flutter. She was seen by the EP service, me and GT 2006-7 and failed 1C therapy she ended up on amiodarone and was subsequently referred for further ablation and underwent catheter ablation at [facility] 2007.she saw a GT a couple of times after that. I saw her 2010 but there is no note available   Over recent months she has had more problems with palpitations. These typically are quite brief. The result and anxiety, if occurring at night, that prevents her from sleeping for the next 2-3 hours. She worries whether she is going to atrial fibrillation again. She worries about strokes.  She has noticed some modest change in her exercise tolerance. She notes that she coughs when she walks. Her mailbox is 6/10 of a mile from the house and sometimes she has shortness of breath when she makes his trip.  She has some episodes of chest discomfort with radiation into her left neck and her left arm. Some years ago she had a false negative Myoview and a catheterization. This was 2006. Catheterization was normal  She also notes that she lost her job about 4 years ago. She lost her car about 4 years ago. She has been largely homebound.  Echocardiogram 2008 demonstrated normal left ventricular function   Past Medical History  Diagnosis Date  . Hearing loss     75%  . Swallowing difficulty     problems for second/need dentures  . Menopausal state   . Paroxysmal a-fib     status post ablation  . Dyslipidemia   . MVP (mitral valve prolapse)   . IBS (irritable bowel syndrome)     remote history  . Hypertension       Surgical History:    Past Surgical History  Procedure Laterality Date  . Cesarean section  1971, 1973  . Nasal septum surgery  1977  . Pulmonary vein isolation  06-2006    procedure for AFIB /[facility]     Home Meds: Prior to Admission medications   Medication Sig Start Date End Date Taking? Authorizing Provider  Acetaminophen 650 MG/20.3ML SUSP Take 20.3 mLs (650 mg total) by mouth every 6 (six) hours as needed. 10/27/12  Yes Agapito Games, MD  ALPRAZolam Prudy Feeler) 0.25 MG tablet Take 1 tablet (0.25 mg total) by mouth daily as needed for anxiety. 08/30/13  Yes Agapito Games, MD  aspirin 81 MG chewable tablet 81 mg daily.   Yes Historical Provider, MD  calcium carbonate 200 MG capsule 250 mg 2 (two) times daily.   Yes Historical Provider, MD  diphenhydrAMINE (BENADRYL) 25 mg capsule 25 mg as needed.   Yes Historical Provider, MD  metoprolol tartrate (LOPRESSOR) 25 MG tablet 25 mg 2 (two) times daily.   Yes Historical Provider, MD  Multiple Vitamin (MULTIVITAMIN) capsule 1 capsule daily.   Yes Historical Provider, MD     Allergies:  Allergies  Allergen Reactions  . Amoxicillin   . Ampicillin   . Azithromycin     REACTION: SOB  . Cephalexin   . Clarithromycin   . Codeine   . Flecainide   . Penicillins     REACTION: rash  .  Shellfish Allergy     Sick to stomach     History   Social History  . Marital Status: Divorced    Spouse Name: N/A    Number of Children: N/A  . Years of Education: N/A   Occupational History  . Not on file.   Social History Main Topics  . Smoking status: Never Smoker   . Smokeless tobacco: Not on file  . Alcohol Use: No  . Drug Use: Not on file  . Sexual Activity: Not on file     Comment: unemployed, GED, divorced,2 adult children, no caffeine, no regular exercise   Other Topics Concern  . Not on file   Social History Narrative  . No narrative on file     Family History  Problem Relation Age of Onset  . Hypertension Mother   .  Diabetes      grandmother  . Heart attack      brother/father  . Stroke      aunt  . Heart attack Father      ROS:  Please see the history of present illness.     All other systems reviewed and negative.    Physical Exam:   Blood pressure 146/69, pulse 75, height 5\' 4"  (1.626 m), weight 117 lb 6.4 oz (53.252 kg). General: Well developed, well nourished female in no acute distress. Head: Normocephalic, atraumatic, sclera non-icteric, no xanthomas, nares are without discharge. EENT: normal edentulous Lymph Nodes:  none  , no CVA tendersness Neck: Negative for carotid bruits. JVD not elevated. Lungs: Clear bilaterally to auscultation without wheezes, rales, or rhonchi. Breathing is unlabored. Heart: RRR with S1 S2. No  murmur , rubs, or gallops appreciated. Abdomen: Soft, non-tender, non-distended with normoactive bowel sounds. No hepatomegaly. No rebound/guarding. No obvious abdominal masses. Msk:  Strength and tone appear normal for age. Extremities: No clubbing or cyanosis. No*  edema.  Distal pedal pulses are 2+ and equal bilaterally. Skin: Warm and Dry Neuro: Alert and oriented X 3. CN III-XII intact Grossly normal sensory and motor function . Psych:  Responds to questions appropriately with a normal affect.      Labs: Cardiac Enzymes No results found for this basename: CKTOTAL, CKMB, TROPONINI,  in the last 72 hours CBC Lab Results  Component Value Date   WBC 5.6 08/24/2013   HGB 12.2 08/24/2013   HCT 35.7* 08/24/2013   MCV 86.4 08/24/2013   PLT 239 08/24/2013   PROTIME: No results found for this basename: LABPROT, INR,  in the last 72 hours Chemistry No results found for this basename: NA, K, CL, CO2, BUN, CREATININE, CALCIUM, LABALBU, PROT, BILITOT, ALKPHOS, ALT, AST, GLUCOSE,  in the last 168 hours Lipids Lab Results  Component Value Date   CHOL 183 07/19/2012   HDL 51 07/19/2012   LDLCALC 109* 07/19/2012   TRIG 114 07/19/2012   BNP No results found for this  basename: probnp   Miscellaneous No results found for this basename: DDIMER    Radiology/Studies:  No results found.  EKG:  Sinus rhythm at 75 Intervals 09/30/39 Nonspecific ST-T changes   Assessment and Plan:    Sherryl MangesSteven Klein

## 2013-12-24 NOTE — Assessment & Plan Note (Signed)
No PVCs were seen

## 2013-12-24 NOTE — Patient Instructions (Addendum)
Your physician recommends that you continue on your current medications as directed. Please refer to the Current Medication list given to you today.  Your physician has requested that you have a lexiscan myoview. For further information please visit [email or web address]. Please follow instruction sheet, as given.  Your physician recommends that you schedule a follow-up appointment in: 2-3 months [facility]         Consider feedback for aniety related Xanax for palpitations

## 2014-01-03 ENCOUNTER — Encounter (HOSPITAL_COMMUNITY): Payer: Self-pay

## 2014-01-10 ENCOUNTER — Other Ambulatory Visit: Payer: Self-pay | Admitting: *Deleted

## 2014-01-10 ENCOUNTER — Ambulatory Visit: Payer: Self-pay | Admitting: Family Medicine

## 2014-01-10 DIAGNOSIS — R079 Chest pain, unspecified: Secondary | ICD-10-CM

## 2014-01-10 DIAGNOSIS — R002 Palpitations: Secondary | ICD-10-CM

## 2014-01-21 ENCOUNTER — Other Ambulatory Visit: Payer: Self-pay | Admitting: *Deleted

## 2014-02-01 ENCOUNTER — Encounter: Payer: Self-pay | Admitting: Family Medicine

## 2014-02-01 ENCOUNTER — Ambulatory Visit (INDEPENDENT_AMBULATORY_CARE_PROVIDER_SITE_OTHER): Payer: Self-pay

## 2014-02-01 ENCOUNTER — Ambulatory Visit (INDEPENDENT_AMBULATORY_CARE_PROVIDER_SITE_OTHER): Payer: Self-pay | Admitting: Family Medicine

## 2014-02-01 VITALS — BP 105/58 | HR 60 | Ht 64.0 in | Wt 117.0 lb

## 2014-02-01 DIAGNOSIS — R634 Abnormal weight loss: Secondary | ICD-10-CM

## 2014-02-01 DIAGNOSIS — R1310 Dysphagia, unspecified: Secondary | ICD-10-CM

## 2014-02-01 DIAGNOSIS — R0789 Other chest pain: Secondary | ICD-10-CM

## 2014-02-01 DIAGNOSIS — L639 Alopecia areata, unspecified: Secondary | ICD-10-CM

## 2014-02-01 LAB — CBC WITH DIFFERENTIAL/PLATELET
BASOS PCT: 0 % (ref 0–1)
Basophils Absolute: 0 10*3/uL (ref 0.0–0.1)
EOS ABS: 0.1 10*3/uL (ref 0.0–0.7)
EOS PCT: 1 % (ref 0–5)
HCT: 37.4 % (ref 36.0–46.0)
Hemoglobin: 12.6 g/dL (ref 12.0–15.0)
LYMPHS ABS: 1.7 10*3/uL (ref 0.7–4.0)
Lymphocytes Relative: 34 % (ref 12–46)
MCH: 29.4 pg (ref 26.0–34.0)
MCHC: 33.7 g/dL (ref 30.0–36.0)
MCV: 87.2 fL (ref 78.0–100.0)
MONOS PCT: 7 % (ref 3–12)
Monocytes Absolute: 0.4 10*3/uL (ref 0.1–1.0)
NEUTROS PCT: 58 % (ref 43–77)
Neutro Abs: 2.9 10*3/uL (ref 1.7–7.7)
PLATELETS: 234 10*3/uL (ref 150–400)
RBC: 4.29 MIL/uL (ref 3.87–5.11)
RDW: 13.5 % (ref 11.5–15.5)
WBC: 5 10*3/uL (ref 4.0–10.5)

## 2014-02-01 LAB — HEMOGLOBIN A1C
HEMOGLOBIN A1C: 5.6 % (ref <5.7)
MEAN PLASMA GLUCOSE: 114 mg/dL (ref <117)

## 2014-02-01 LAB — SEDIMENTATION RATE: SED RATE: 4 mm/h (ref 0–22)

## 2014-02-01 MED ORDER — PANTOPRAZOLE SODIUM 40 MG PO TBEC: 40.0000 mg | DELAYED_RELEASE_TABLET | Freq: Every day | ORAL | Status: DC

## 2014-02-01 NOTE — Patient Instructions (Signed)
Please keep a food diary review white count how many calories are actually taking in per day. We will call you with referral to gastroneurology is.

## 2014-02-01 NOTE — Progress Notes (Signed)
Subjective:    Patient ID: Stephanie Rollins, female    DOB: 02/26/1951, 63 y.o.   MRN: [id number]  HPI C/O weight loss over the last few years. She has lost about 4 lbs in the last few months. She also has missing teeth that could affect her eating. Also alopecia.  Since she lost her job she is not as physically active.  She is more sedentary.  Car died and so now relies on people to bring her.  This causes problems.  Had normal thyroid, iron, b12, etc about 5 months ago. No  Swollen LN.  No diarrhea.  Feels a pressure build in in her chest when she eaats.  STarts in mid chest.  Last endoscopy was in 2007.   Alopecia - has diffuse hairloss.  Does feel stressed. Denies feeling depressed.  Feels like hte hair is breaking more easily. Has noticed it more over the last year or two. Monstly on the top and feels hairline is receding.   She has had several episodes of tacycardia.  She did have a heart monitor that confirmed she wasn't in a-fib. This reassured her but her cardiologist does want her to have a dobutamine stress test since she does have occ chest pain.     Review of Systems  BP 105/58  Pulse 60  Ht 5\' 4"  (1.626 m)  Wt 117 lb (53.071 kg)  BMI 20.07 kg/m2  SpO2 94%    Allergies  Allergen Reactions  . Amoxicillin   . Ampicillin   . Azithromycin     REACTION: SOB  . Cephalexin   . Clarithromycin   . Codeine   . Flecainide   . Penicillins     REACTION: rash  . Shellfish Allergy     Sick to stomach   . Shellfish-Derived Products Hives and Nausea And Vomiting    Past Medical History  Diagnosis Date  . Hearing loss     75%  . Swallowing difficulty     problems for second/need dentures  . Menopausal state   . Paroxysmal a-fib     status post ablation  . Dyslipidemia   . MVP (mitral valve prolapse)   . IBS (irritable bowel syndrome)     remote history  . Hypertension     Past Surgical History  Procedure Laterality Date  . Cesarean section  1971, 1973  . Nasal  septum surgery  1977  . Pulmonary vein isolation  06-2006    procedure for AFIB /[facility]    History   Social History  . Marital Status: Divorced    Spouse Name: N/A    Number of Children: N/A  . Years of Education: N/A   Occupational History  . Not on file.   Social History Main Topics  . Smoking status: Never Smoker   . Smokeless tobacco: Not on file  . Alcohol Use: No  . Drug Use: Not on file  . Sexual Activity: Not on file     Comment: unemployed, GED, divorced,2 adult children, no caffeine, no regular exercise   Other Topics Concern  . Not on file   Social History Narrative  . No narrative on file    Family History  Problem Relation Age of Onset  . Hypertension Mother   . Diabetes      grandmother  . Heart attack      brother/father  . Stroke      aunt  . Heart attack Father     Outpatient  Encounter Prescriptions as of 02/01/2014  Medication Sig  . Acetaminophen 650 MG/20.3ML SUSP Take 20.3 mLs (650 mg total) by mouth every 6 (six) hours as needed.  . ALPRAZolam (XANAX) 0.25 MG tablet Take 1 tablet (0.25 mg total) by mouth daily as needed for anxiety.  Marland Kitchen aspirin 81 MG chewable tablet 81 mg daily.  . diphenhydrAMINE (BENADRYL) 12.5 MG/5ML liquid Take 25 mg by mouth as needed.  . metoprolol tartrate (LOPRESSOR) 25 MG tablet 25 mg 2 (two) times daily.  . Multiple Vitamin (MULTIVITAMIN) capsule 1 capsule daily.  . pantoprazole (PROTONIX) 40 MG tablet Take 1 tablet (40 mg total) by mouth daily.  . [DISCONTINUED] calcium carbonate 200 MG capsule 250 mg 2 (two) times daily.  . [DISCONTINUED] diphenhydrAMINE (BENADRYL) 25 mg capsule 25 mg as needed.          Objective:   Physical Exam  Constitutional: She is oriented to person, place, and time. She appears well-developed and well-nourished.  HENT:  Head: Normocephalic and atraumatic.  Right Ear: External ear normal.  Left Ear: External ear normal.  Nose: Nose normal.  Mouth/Throat: Oropharynx is  clear and moist.  TMs and canals are clear.   Eyes: Conjunctivae and EOM are normal. Pupils are equal, round, and reactive to light.  Neck: Neck supple. No thyromegaly present.  Cardiovascular: Normal rate, regular rhythm and normal heart sounds.   Pulmonary/Chest: Effort normal and breath sounds normal. She has no wheezes.  Lymphadenopathy:    She has no cervical adenopathy.  Neurological: She is alert and oriented to person, place, and time.  Skin: Skin is warm and dry.  Psychiatric: She has a normal mood and affect.          Assessment & Plan:  Alopecia areata - likely androgenic. She recently had normal TSH and ferritin levels. Consider treatment with oral finasteride or topical minoxidil.  Abnormal weight loss - Will check CBC, A1C, U/A, stool cards, sed rate, CRP, CMP, CXR.  May have some mild dysphagia.  Recommend calorie counting/food diary.  Lack of dentition may be conbtributin.   Will refer to GI for endoscpy for possible stricture.   Atypical CP - having difficulty with transportation getting to dobutamine echo. We will see if we can help her on this.

## 2014-02-02 LAB — URINALYSIS, ROUTINE W REFLEX MICROSCOPIC
BILIRUBIN URINE: NEGATIVE
Glucose, UA: NEGATIVE mg/dL
HGB URINE DIPSTICK: NEGATIVE
KETONES UR: NEGATIVE mg/dL
Leukocytes, UA: NEGATIVE
Nitrite: NEGATIVE
PROTEIN: NEGATIVE mg/dL
Specific Gravity, Urine: 1.009 (ref 1.005–1.030)
UROBILINOGEN UA: 0.2 mg/dL (ref 0.0–1.0)
pH: 7 (ref 5.0–8.0)

## 2014-02-02 LAB — COMPLETE METABOLIC PANEL WITH GFR
ALK PHOS: 57 U/L (ref 39–117)
ALT: 17 U/L (ref 0–35)
AST: 18 U/L (ref 0–37)
Albumin: 4.5 g/dL (ref 3.5–5.2)
BILIRUBIN TOTAL: 0.9 mg/dL (ref 0.2–1.2)
BUN: 6 mg/dL (ref 6–23)
CO2: 30 meq/L (ref 19–32)
CREATININE: 0.57 mg/dL (ref 0.50–1.10)
Calcium: 9.3 mg/dL (ref 8.4–10.5)
Chloride: 102 mEq/L (ref 96–112)
GLUCOSE: 95 mg/dL (ref 70–99)
Potassium: 3.5 mEq/L (ref 3.5–5.3)
Sodium: 142 mEq/L (ref 135–145)
Total Protein: 6.9 g/dL (ref 6.0–8.3)

## 2014-02-02 LAB — C-REACTIVE PROTEIN

## 2014-02-08 ENCOUNTER — Encounter: Payer: Self-pay | Admitting: Gastroenterology

## 2014-02-08 ENCOUNTER — Other Ambulatory Visit (HOSPITAL_COMMUNITY): Payer: Self-pay

## 2014-04-09 ENCOUNTER — Ambulatory Visit: Payer: Self-pay | Admitting: Gastroenterology

## 2014-05-20 ENCOUNTER — Other Ambulatory Visit: Payer: Self-pay | Admitting: Family Medicine

## 2014-05-21 ENCOUNTER — Telehealth: Payer: Self-pay | Admitting: *Deleted

## 2014-05-21 NOTE — Telephone Encounter (Signed)
Metoprolol refill was sent yesterday for 1 month supply and a fax was rec'd today that she was requesting a 90 day supply. I called [facility] and spoke with Melissa and let her know that Nicole CellaDorothy needed to schedule an appt to receive further refills of this medicine. Corliss SkainsJamie Painter, CMA

## 2014-06-06 ENCOUNTER — Telehealth: Payer: Self-pay | Admitting: *Deleted

## 2014-06-06 NOTE — Telephone Encounter (Signed)
Ok to refill metoprolol for a one time 90 day supply? Patient stated that this was originally prescribed by Dr Jens Somrenshaw, but she is almost 2 years out from seeing him. She did see Dr Graciela HusbandsKlein this year and is aware that she needs an appointment with him also but her insurance is pending at this time so she is unable to schedule an appointment. Please advise. Thanks, MI

## 2014-06-07 ENCOUNTER — Other Ambulatory Visit: Payer: Self-pay | Admitting: *Deleted

## 2014-06-07 MED ORDER — METOPROLOL TARTRATE 25 MG PO TABS: 25.0000 mg | ORAL_TABLET | Freq: Two times a day (BID) | ORAL | Status: DC

## 2014-06-07 NOTE — Telephone Encounter (Signed)
Ok to refill, per Dr. Graciela HusbandsKlein.

## 2014-06-12 ENCOUNTER — Encounter: Payer: Self-pay | Admitting: Gastroenterology

## 2014-06-17 ENCOUNTER — Other Ambulatory Visit: Payer: Medicare Other

## 2014-06-17 ENCOUNTER — Ambulatory Visit (INDEPENDENT_AMBULATORY_CARE_PROVIDER_SITE_OTHER): Payer: Medicare Other | Admitting: Gastroenterology

## 2014-06-17 ENCOUNTER — Encounter: Payer: Self-pay | Admitting: Gastroenterology

## 2014-06-17 VITALS — BP 110/60 | HR 56 | Ht 63.25 in | Wt 116.4 lb

## 2014-06-17 DIAGNOSIS — R1310 Dysphagia, unspecified: Secondary | ICD-10-CM

## 2014-06-17 DIAGNOSIS — R634 Abnormal weight loss: Secondary | ICD-10-CM

## 2014-06-17 MED ORDER — MOVIPREP 100 G PO SOLR: 1.0000 | Freq: Once | ORAL | Status: DC

## 2014-06-17 NOTE — Progress Notes (Signed)
HPI: This is a  very pleasant 63 year old woman whom I last saw about 8 years ago.  EGD 02/2006 for dysphagia, was normal. I recommended that time she consider getting dentures since her dysphasia was at least in part due to lack of teeth.  Also colonoscopy 02/2006 was normal (she tells me).  Sent by her PCP  Lost her job 2011, then took early retirement.  Has lost 10 pounds per year since then.  Allopecia, fairly new also.  1200-1600 kcal per day per patient.  Feels bloated a lot.  Belching helps this.  Bowels fairly regularly.  Has hemorrhoids, in 1997 fissure problems. Sees blood periodically now.  Does have minor rectal discharge.  Dysphagia, liquids and solids. Somewhat progressive.  Problem at least since 2007.  Has no teeth.   Review of systems: Pertinent positive and negative review of systems were noted in the above HPI section. Complete review of systems was performed and was otherwise normal.    Past Medical History  Diagnosis Date  . Hearing loss     75%  . Swallowing difficulty     problems for second/need dentures  . Menopausal state   . Paroxysmal a-fib     status post ablation  . Dyslipidemia   . MVP (mitral valve prolapse)   . IBS (irritable bowel syndrome)     remote history  . Hypertension   . Anal fistula   . Atrial fibrillation   . Interstitial cystitis     Past Surgical History  Procedure Laterality Date  . Cesarean section  1971, 1973    x 2  . Nasal septum surgery  1977  . Pulmonary vein isolation  06-2006    procedure for AFIB /[facility]  . Cardiac electrophysiology mapping and ablation      for A fib    Current Outpatient Prescriptions  Medication Sig Dispense Refill  . aspirin 81 MG chewable tablet 81 mg daily.      . diphenhydrAMINE (BENADRYL) 12.5 MG/5ML liquid Take 12.5 mg by mouth as needed.       . metoprolol tartrate (LOPRESSOR) 25 MG tablet Take 1 tablet (25 mg total) by mouth 2 (two) times daily.  60 tablet  2   No  current facility-administered medications for this visit.    Allergies as of 06/17/2014 - Review Complete 06/17/2014  Allergen Reaction Noted  . Ampicillin  10/06/2006  . Azithromycin  03/25/2008  . Cephalexin  10/15/2009  . Clarithromycin  10/06/2006  . Codeine  10/06/2006  . Flecainide  07/19/2012  . Penicillins  10/15/2009  . Shellfish allergy  12/24/2013  . Shellfish-derived products Hives and Nausea And Vomiting 02/01/2014  . Amoxicillin Rash 07/19/2012    Family History  Problem Relation Age of Onset  . Hypertension Mother   . Diabetes Maternal Grandmother   . Heart disease Brother   . Stroke Paternal Aunt   . Heart attack Father   . Heart disease Sister     x 2    History   Social History  . Marital Status: Divorced    Spouse Name: N/A    Number of Children: 2  . Years of Education: N/A   Occupational History  . retired    Social History Main Topics  . Smoking status: Never Smoker   . Smokeless tobacco: Never Used  . Alcohol Use: No  . Drug Use: No  . Sexual Activity: Not on file     Comment: unemployed, GED, divorced,2 adult children, no  caffeine, no regular exercise   Other Topics Concern  . Not on file   Social History Narrative  . No narrative on file       Physical Exam: BP 110/60  Pulse 56  Ht 5' 3.25" (1.607 m)  Wt 116 lb 6 oz (52.787 kg)  BMI 20.44 kg/m2 Constitutional: generally well-appearing, except a bit cachectic  Psychiatric: alert and oriented x3 Eyes: extraocular movements intact Mouth: oral pharynx moist, no lesions Neck: supple no lymphadenopathy Cardiovascular: heart regular rate and rhythm Lungs: clear to auscultation bilaterally Abdomen: soft, nontender, nondistended, no obvious ascites, no peritoneal signs, normal bowel sounds Extremities: no lower extremity edema bilaterally Skin: no lesions on visible extremities    Assessment and plan: 63 y.o. female with  chronic dysphasia that has probably worsened lately,  30 pound weight loss in the past 2-3 years, minor rectal bleeding  I would like to proceed with upper and lower endoscopies to evaluate the above complaints. The most concerning is her 30 pound weight loss however it has been over  2-3 years.  Celiac sprue can also cause weight loss, she does admit that her stools tend to be fairly soft and so I will send celiac sprue panel testing.

## 2014-06-17 NOTE — Patient Instructions (Addendum)
You will have labs checked today in the basement lab.  Please head down after you check out with the front desk  (celiac panel). You will be set up for an upper endoscopy for dysphagia, weight loss You will be set up for a colonoscopy for bleeding, weight loss.

## 2014-06-18 LAB — CELIAC PANEL 10
ENDOMYSIAL SCREEN: NEGATIVE
GLIADIN IGA: 6.3 U/mL (ref <20)
Gliadin IgG: 3.8 U/mL (ref <20)
IgA: 239 mg/dL (ref 69–380)
TISSUE TRANSGLUT AB: 9.9 U/mL (ref <20)
Tissue Transglutaminase Ab, IgA: 4.6 U/mL (ref <20)

## 2014-06-28 ENCOUNTER — Encounter: Payer: Self-pay | Admitting: Family Medicine

## 2014-06-28 ENCOUNTER — Ambulatory Visit (INDEPENDENT_AMBULATORY_CARE_PROVIDER_SITE_OTHER): Payer: Medicare Other | Admitting: Family Medicine

## 2014-06-28 VITALS — BP 105/59 | HR 57 | Wt 111.0 lb

## 2014-06-28 DIAGNOSIS — I4949 Other premature depolarization: Secondary | ICD-10-CM

## 2014-06-28 DIAGNOSIS — S93401A Sprain of unspecified ligament of right ankle, initial encounter: Secondary | ICD-10-CM

## 2014-06-28 DIAGNOSIS — S93409A Sprain of unspecified ligament of unspecified ankle, initial encounter: Secondary | ICD-10-CM

## 2014-06-28 MED ORDER — METOPROLOL TARTRATE 25 MG PO TABS: 12.5000 mg | ORAL_TABLET | Freq: Two times a day (BID) | ORAL | Status: DC

## 2014-06-28 NOTE — Progress Notes (Signed)
CC: Stephanie Rollins is a 63 y.o. female is here for right ankle pain   Subjective: HPI:  Patient presents with a chief complaint of right ankle pain and a list of 5 other unrelated concerns, due to time constraints I've asked her to limit her problem list to only 2 issues today  Complains of right ankle pain that has been present since July. Localized to the lateral aspect of the ankle and radiates forward, described as a soreness and tightness. Worse when walking longer than a quarter of a mile. Absent at rest. Worse first thing in the morning but improves throughout the day as long as she "takes it easy". Occasional swelling at the site of discomfort but no redness or bruising. Symptoms began immediately after she rolled her ankle into inversion, symptoms are slightly getting better until 4 days later she had an identical injury.  Interventions have included Ace wraps, icing. Symptoms have greatly improved since onset however are still of mild severity and she's worried that she may have broken one of her bones. Denies joint pain elsewhere. Denies fevers, chills, nor permanent swelling of the appendages  Patient presents concerns regarding her heart rate. She's noticed at home that it seems to remain somewhere between 45 and the mid 4150s. At its lowest she will experience fatigue even with walking to the mailbox. She's been on metoprolol 25 mg twice a day for about a year now without any known intolerance other than that described above. She denies irregular heartbeat, racing heartbeat, chest pain, shortness of breath, focal weakness, nor any motor or sensory disturbances   Review Of Systems Outlined In HPI  Past Medical History  Diagnosis Date  . Hearing loss     75%  . Swallowing difficulty     problems for second/need dentures  . Menopausal state   . Paroxysmal a-fib     status post ablation  . Dyslipidemia   . MVP (mitral valve prolapse)   . IBS (irritable bowel syndrome)     remote  history  . Hypertension   . Anal fistula   . Atrial fibrillation   . Interstitial cystitis     Past Surgical History  Procedure Laterality Date  . Cesarean section  1971, 1973    x 2  . Nasal septum surgery  1977  . Pulmonary vein isolation  06-2006    procedure for AFIB /[facility]  . Cardiac electrophysiology mapping and ablation      for A fib   Family History  Problem Relation Age of Onset  . Hypertension Mother   . Diabetes Maternal Grandmother   . Heart disease Brother   . Stroke Paternal Aunt   . Heart attack Father   . Heart disease Sister     x 2    History   Social History  . Marital Status: Divorced    Spouse Name: N/A    Number of Children: 2  . Years of Education: N/A   Occupational History  . retired    Social History Main Topics  . Smoking status: Never Smoker   . Smokeless tobacco: Never Used  . Alcohol Use: No  . Drug Use: No  . Sexual Activity: Not on file     Comment: unemployed, GED, divorced,2 adult children, no caffeine, no regular exercise   Other Topics Concern  . Not on file   Social History Narrative  . No narrative on file     Objective: BP 105/59  Pulse 57  Wt 111 lb (50.349 kg)  General: Alert and Oriented, No Acute Distress HEENT: Pupils equal, round, reactive to light. Conjunctivae clear.  Moist mucous membranes pharynx unremarkable  Lungs: Clear to auscultation bilaterally, no wheezing/ronchi/rales.  Comfortable work of breathing. Good air movement. Cardiac: Regular rhythm, bradycardia. Normal S1/S2.  No murmurs, rubs, nor gallops.   Extremities: No peripheral edema.  Strong peripheral pulses. Right foot exam: No pain with palpation of medial malleoli, pain is reproduced with palpation of the ATF ligament at the insertion of the lateral malleoli but no other lateral malleoli pain with palpation. Anterior drawer negative. No pain with palpation of the navicular or base of the fifth metatarsal. No overlying swelling  redness or warmth and she has full range of motion in flexion and extension. Pain is reproduced with resisted inversion. Mental Status: No depression, anxiety, nor agitation. Skin: Warm and dry.  Assessment & Plan: Islam was seen today for right ankle pain.  Diagnoses and associated orders for this visit:  Right ankle sprain, initial encounter  PREMATURE VENTRICULAR CONTRACTIONS - metoprolol tartrate (LOPRESSOR) 25 MG tablet; Take 0.5 tablets (12.5 mg total) by mouth 2 (two) times daily.  Other Orders - Discontinue: metoprolol tartrate (LOPRESSOR) 25 MG tablet; Take 0.5 tablets (12.5 mg total) by mouth 2 (two) times daily.    Right ankle sprain: Reassurance provided extremely low suspicion of avulsion or fracture, she was given a handout on home rehabilitation exercises to be performed on a daily basis for the next 2-3 weeks. Advised to ice the ankle after daily exercises. She was also given a new Ace bandage to use to keep the ankle compresses for the next 2 weeks. PVCs: Controlled however symptomatic bradycardia therefore decreasing metoprolol, I've asked her to return within the next month to have this rechecked.  I kindly asked her to followup with her PCP for her concerns today regarding chronic unintentional weight loss, chronic dysphagia, chronic dry mouth.  40 minutes spent face-to-face during visit today of which at least 50% was counseling or coordinating care regarding: 1. Right ankle sprain, initial encounter   2. PREMATURE VENTRICULAR CONTRACTIONS      Return in about 4 weeks (around 07/26/2014) for Pulse Follow Up with Dr. Linford Arnold.

## 2014-07-03 ENCOUNTER — Encounter: Payer: Medicare Other | Admitting: Gastroenterology

## 2014-08-07 ENCOUNTER — Encounter: Payer: Self-pay | Admitting: Cardiology

## 2014-08-07 ENCOUNTER — Encounter: Payer: Self-pay | Admitting: *Deleted

## 2014-08-07 ENCOUNTER — Ambulatory Visit (INDEPENDENT_AMBULATORY_CARE_PROVIDER_SITE_OTHER): Payer: Medicare Other | Admitting: Cardiology

## 2014-08-07 VITALS — BP 130/68 | HR 64 | Wt 113.0 lb

## 2014-08-07 DIAGNOSIS — I059 Rheumatic mitral valve disease, unspecified: Secondary | ICD-10-CM | POA: Diagnosis not present

## 2014-08-07 DIAGNOSIS — I341 Nonrheumatic mitral (valve) prolapse: Secondary | ICD-10-CM

## 2014-08-07 DIAGNOSIS — R072 Precordial pain: Secondary | ICD-10-CM

## 2014-08-07 DIAGNOSIS — I4891 Unspecified atrial fibrillation: Secondary | ICD-10-CM | POA: Diagnosis not present

## 2014-08-07 DIAGNOSIS — I4949 Other premature depolarization: Secondary | ICD-10-CM | POA: Diagnosis not present

## 2014-08-07 MED ORDER — METOPROLOL TARTRATE 25 MG PO TABS: 12.5000 mg | ORAL_TABLET | Freq: Two times a day (BID) | ORAL | Status: DC

## 2014-08-07 NOTE — Assessment & Plan Note (Signed)
Symptoms atypical. Electrocardiogram normal. No further ischemia evaluation at this point.

## 2014-08-07 NOTE — Progress Notes (Signed)
      HPI:  fu SVT as well as atrial fibrillation and mitral valve prolapse. Cardiac catheterization in September of 2002 showed normal coronary arteries and normal LV function with an ejection fraction of 65%. Myoview in 2007 showed EF 70 and normal perfusion. Echocardiogram in 2008 showed normal LV function and trace mitral regurgitation. She has had a previous ablation of her atrial fibrillation in August 2007 at [facility]. She has also had recurrent palpitations secondary to PVCs treated with beta blockade. Event monitor 12/14 showed sinus with brief PAT. Since I last saw her She denies dyspnea. Her palpitations are recently well controlled. She has chest pain after eating. No exertional symptoms.   Current Outpatient Prescriptions  Medication Sig Dispense Refill  . aspirin 81 MG chewable tablet Chew 81 mg by mouth daily.       . diphenhydrAMINE (BENADRYL) 12.5 MG/5ML liquid Take 12.5 mg by mouth as needed.       . metoprolol tartrate (LOPRESSOR) 25 MG tablet Take 0.5 tablets (12.5 mg total) by mouth 2 (two) times daily.  90 tablet  0   No current facility-administered medications for this visit.     Past Medical History  Diagnosis Date  . Hearing loss     75%  . Swallowing difficulty     problems for second/need dentures  . Menopausal state   . Paroxysmal a-fib     status post ablation  . Dyslipidemia   . MVP (mitral valve prolapse)   . IBS (irritable bowel syndrome)     remote history  . Hypertension   . Anal fistula   . Atrial fibrillation   . Interstitial cystitis     Past Surgical History  Procedure Laterality Date  . Cesarean section  1971, 1973    x 2  . Nasal septum surgery  1977  . Pulmonary vein isolation  06-2006    procedure for AFIB /[facility]  . Cardiac electrophysiology mapping and ablation      for A fib    History   Social History  . Marital Status: Divorced    Spouse Name: N/A    Number of Children: 2  . Years of Education: N/A     Occupational History  . retired    Social History Main Topics  . Smoking status: Never Smoker   . Smokeless tobacco: Never Used  . Alcohol Use: No  . Drug Use: No  . Sexual Activity: Not on file     Comment: unemployed, GED, divorced,2 adult children, no caffeine, no regular exercise   Other Topics Concern  . Not on file   Social History Narrative  . No narrative on file    ROS: no fevers or chills, productive cough, hemoptysis, dysphasia, odynophagia, melena, hematochezia, dysuria, hematuria, rash, seizure activity, orthopnea, PND, pedal edema, claudication. Remaining systems are negative.  Physical Exam: Well-developed frail in no acute distress.  Skin is warm and dry.  HEENT is normal.  Neck is supple.  Chest is clear to auscultation with normal expansion.  Cardiovascular exam is regular rate and rhythm.  Abdominal exam nontender or distended. No masses palpated. Extremities show no edema. neuro grossly intact  ECG Sinus rhythm at a rate of 64. No ST changes.

## 2014-08-07 NOTE — Assessment & Plan Note (Signed)
Repeat echocardiogram. 

## 2014-08-07 NOTE — Patient Instructions (Signed)

## 2014-08-07 NOTE — Assessment & Plan Note (Signed)
No evidence of recurrence. Continue aspirin and beta blocker.

## 2014-08-07 NOTE — Assessment & Plan Note (Signed)
Continue beta blocker. 

## 2014-08-08 ENCOUNTER — Ambulatory Visit (HOSPITAL_COMMUNITY)
Admission: RE | Admit: 2014-08-08 | Discharge: 2014-08-08 | Disposition: A | Discharge status: Home / Self Care | Payer: Medicare Other | Source: Ambulatory Visit | Attending: Cardiology | Admitting: Cardiology

## 2014-08-08 DIAGNOSIS — I10 Essential (primary) hypertension: Secondary | ICD-10-CM | POA: Diagnosis not present

## 2014-08-08 DIAGNOSIS — I482 Chronic atrial fibrillation, unspecified: Secondary | ICD-10-CM

## 2014-08-08 DIAGNOSIS — I059 Rheumatic mitral valve disease, unspecified: Secondary | ICD-10-CM | POA: Diagnosis not present

## 2014-08-08 DIAGNOSIS — E785 Hyperlipidemia, unspecified: Secondary | ICD-10-CM | POA: Insufficient documentation

## 2014-08-08 NOTE — Progress Notes (Signed)
2D Echocardiogram Complete.  08/08/2014   Dominyk Law, RDCS  

## 2014-08-21 ENCOUNTER — Ambulatory Visit (INDEPENDENT_AMBULATORY_CARE_PROVIDER_SITE_OTHER): Payer: Medicare Other | Admitting: Family Medicine

## 2014-08-21 VITALS — Temp 98.3°F

## 2014-08-21 DIAGNOSIS — Z23 Encounter for immunization: Secondary | ICD-10-CM

## 2014-08-21 NOTE — Progress Notes (Signed)
   Subjective:    Patient ID: Stephanie Rollins, female    DOB: May 06, 1951, 63 y.o.   MRN: [id number]  HPIHere for Flu vaccination.    Review of Systems     Objective:   Physical Exam        Assessment & Plan:  Tolerated injection without complications.

## 2014-12-30 ENCOUNTER — Telehealth: Payer: Self-pay | Admitting: Cardiology

## 2014-12-30 NOTE — Telephone Encounter (Signed)
Pt called in stating that she feels that her Metoprolol medication should be increased back to the 1 tab bid. Please f/u with pt   Thanks

## 2014-12-30 NOTE — Telephone Encounter (Signed)
Left message for pt to call.

## 2015-01-21 ENCOUNTER — Telehealth: Payer: Self-pay | Admitting: Cardiology

## 2015-01-21 DIAGNOSIS — I480 Paroxysmal atrial fibrillation: Secondary | ICD-10-CM

## 2015-01-21 MED ORDER — METOPROLOL TARTRATE 25 MG PO TABS: 25.0000 mg | ORAL_TABLET | Freq: Two times a day (BID) | ORAL | Status: DC

## 2015-01-21 NOTE — Telephone Encounter (Signed)
Stephanie Rollins is calling to get her Metoprolol increased to the whole tablet twice a day instead of the half of tablet. Please call    Thanks

## 2015-01-21 NOTE — Telephone Encounter (Signed)
Spoke with pt, we had decreased her metopolol to 1/2 tablet bid because of her bp running low. She would like to increase back to 25 mg bid because her bp seems to be stable and she reports more problems with her mitral valve on the lower dosage of metoprolol. New script sent to the pharm for lopressor 25 mg bid.

## 2015-05-19 ENCOUNTER — Other Ambulatory Visit: Payer: Self-pay

## 2015-08-25 DIAGNOSIS — N3010 Interstitial cystitis (chronic) without hematuria: Secondary | ICD-10-CM | POA: Diagnosis not present

## 2015-08-25 DIAGNOSIS — R350 Frequency of micturition: Secondary | ICD-10-CM | POA: Diagnosis not present

## 2015-09-04 ENCOUNTER — Ambulatory Visit (INDEPENDENT_AMBULATORY_CARE_PROVIDER_SITE_OTHER): Payer: Medicare Other | Admitting: Sports Medicine

## 2015-09-04 VITALS — Temp 98.0°F

## 2015-09-04 DIAGNOSIS — Z23 Encounter for immunization: Secondary | ICD-10-CM | POA: Diagnosis not present

## 2016-01-26 ENCOUNTER — Other Ambulatory Visit: Payer: Self-pay

## 2016-01-26 DIAGNOSIS — I480 Paroxysmal atrial fibrillation: Secondary | ICD-10-CM

## 2016-01-26 MED ORDER — METOPROLOL TARTRATE 25 MG PO TABS: 25.0000 mg | ORAL_TABLET | Freq: Two times a day (BID) | ORAL | Status: DC

## 2016-01-26 NOTE — Telephone Encounter (Signed)
Rx(s) sent to pharmacy electronically.  

## 2016-02-18 ENCOUNTER — Ambulatory Visit: Payer: Medicare Other | Admitting: Cardiology

## 2016-03-11 ENCOUNTER — Encounter: Payer: Self-pay | Admitting: Gastroenterology

## 2016-03-12 ENCOUNTER — Emergency Department (INDEPENDENT_AMBULATORY_CARE_PROVIDER_SITE_OTHER)
Admission: EM | Admit: 2016-03-12 | Discharge: 2016-03-12 | Disposition: A | Discharge status: Home / Self Care | Payer: Medicare Other | Source: Home / Self Care | Attending: Family Medicine | Admitting: Family Medicine

## 2016-03-12 ENCOUNTER — Encounter: Payer: Self-pay | Admitting: *Deleted

## 2016-03-12 DIAGNOSIS — B9789 Other viral agents as the cause of diseases classified elsewhere: Principal | ICD-10-CM

## 2016-03-12 DIAGNOSIS — J069 Acute upper respiratory infection, unspecified: Secondary | ICD-10-CM

## 2016-03-12 MED ORDER — DOXYCYCLINE HYCLATE 100 MG PO CAPS: 100.0000 mg | ORAL_CAPSULE | Freq: Two times a day (BID) | ORAL | Status: DC

## 2016-03-12 NOTE — ED Provider Notes (Signed)
CSN: [id number]     Arrival date & time 03/12/16  1408 History   First MD Initiated Contact with Patient 03/12/16 1425     Chief Complaint  Patient presents with  . Cough      HPI Comments: About 1.5 weeks ago patient developed typical cold-like symptoms developing over several days,  including mild sore throat, sinus congestion, headache, and fatigue.  She developed a productive cough about one week ago.  She denies fevers, chills, and sweats, pleuritic pain, or shortness of breath and does not feel ill.  The history is provided by the patient.    Past Medical History  Diagnosis Date  . Hearing loss     75%  . Swallowing difficulty     problems for second/need dentures  . Menopausal state   . Paroxysmal a-fib (HCC)     status post ablation  . Dyslipidemia   . MVP (mitral valve prolapse)   . IBS (irritable bowel syndrome)     remote history  . Hypertension   . Anal fistula   . Atrial fibrillation (HCC)   . Interstitial cystitis    Past Surgical History  Procedure Laterality Date  . Cesarean section  1971, 1973    x 2  . Nasal septum surgery  1977  . Pulmonary vein isolation  06-2006    procedure for AFIB /[facility]  . Cardiac electrophysiology mapping and ablation      for A fib   Family History  Problem Relation Age of Onset  . Hypertension Mother   . Diabetes Maternal Grandmother   . Heart disease Brother   . Stroke Paternal Aunt   . Heart attack Father   . Heart disease Sister     x 2   Social History  Substance Use Topics  . Smoking status: Never Smoker   . Smokeless tobacco: Never Used  . Alcohol Use: No   OB History    No data available     Review of Systems + sore throat, resolved + cough No pleuritic pain No wheezing + nasal congestion, improved + post-nasal drainage No sinus pain/pressure No itchy/red eyes No earache No hemoptysis No SOB No fever/chills No nausea No vomiting No abdominal pain No diarrhea No urinary  symptoms No skin rash + fatigue No myalgias No headache Used OTC meds without relief  Allergies  Ampicillin; Azithromycin; Cephalexin; Clarithromycin; Codeine; Flecainide; Penicillins; Shellfish allergy; Shellfish-derived products; and Amoxicillin  Home Medications   Prior to Admission medications   Medication Sig Start Date End Date Taking? Authorizing Provider  aspirin 81 MG chewable tablet Chew 81 mg by mouth daily.     Historical Provider, MD  doxycycline (VIBRAMYCIN) 100 MG capsule Take 1 capsule (100 mg total) by mouth 2 (two) times daily. Take with food (Rx void after 03/20/16) 03/12/16   Lattie HawStephen A Beese, MD  metoprolol tartrate (LOPRESSOR) 25 MG tablet Take 1 tablet (25 mg total) by mouth 2 (two) times daily. Patient needs to call our office to schedule an appointment for future refills. 01/26/16   Lewayne BuntingBrian S Crenshaw, MD   Meds Ordered and Administered this Visit  Medications - No data to display  BP 129/78 mmHg  Pulse 79  Temp(Src) 98.4 F (36.9 C) (Oral)  Resp 16  Ht 5\' 4"  (1.626 m)  Wt 125 lb (56.7 kg)  BMI 21.45 kg/m2  SpO2 98% No data found.   Physical Exam Nursing notes and Vital Signs reviewed. Appearance:  Patient appears stated age, and  in no acute distress Eyes:  Pupils are equal, round, and reactive to light and accomodation.  Extraocular movement is intact.  Conjunctivae are not inflamed  Ears:  Canals normal.  Tympanic membranes normal.  Nose:  Mildly congested turbinates.  No sinus tenderness.  Pharynx:  Normal Neck:  Supple.  Tender enlarged posterior/lateral nodes are palpated bilaterally  Lungs:  Clear to auscultation.  Breath sounds are equal.  Moving air well. Heart:  Regular rate and rhythm without murmurs, rubs, or gallops.  Abdomen:  Nontender without masses or hepatosplenomegaly.  Bowel sounds are present.  No CVA or flank tenderness.  Extremities:  No edema.  Skin:  No rash present.   ED Course  Procedures none   MDM   1. Viral URI with  cough    There is no evidence of bacterial infection today.  Treat symptomatically for now  Take plain guaifenesin (  extended release tabs such as Mucinex) twice daily, with plenty of water, for cough and congestion. Get adequate rest.    Continue using saline nasal spray several times daily and saline nasal irrigation (AYR is a common brand).    Try warm salt water gargles for sore throat.  Stop all antihistamines for now, and other non-prescription cough/cold preparations.   Begin doxycycline if not improving about 5 days or if persistent fever develops (Given a prescription to hold, with an expiration date)  Follow-up with family doctor if not improving about one week.    Lattie Haw, MD 03/12/16 [phone]

## 2016-03-12 NOTE — Discharge Instructions (Signed)
Take plain guaifenesin (1200mg  extended release tabs such as Mucinex) twice daily, with plenty of water, for cough and congestion. Get adequate rest.    Continue using saline nasal spray several times daily and saline nasal irrigation (AYR is a common brand).    Try warm salt water gargles for sore throat.  Stop all antihistamines for now, and other non-prescription cough/cold preparations.   Begin doxycycline if not improving about 5 days or if persistent fever develops  Follow-up with family doctor if not improving about one week.

## 2016-03-12 NOTE — ED Notes (Signed)
Pt c/o productive cough x 4 days. She has taken OTC children's multi cold s/s.

## 2016-03-17 ENCOUNTER — Encounter: Payer: Self-pay | Admitting: Cardiology

## 2016-03-17 ENCOUNTER — Ambulatory Visit (INDEPENDENT_AMBULATORY_CARE_PROVIDER_SITE_OTHER): Payer: Medicare Other | Admitting: Cardiology

## 2016-03-17 VITALS — BP 120/74 | HR 78 | Ht 64.0 in | Wt 123.8 lb

## 2016-03-17 DIAGNOSIS — I341 Nonrheumatic mitral (valve) prolapse: Secondary | ICD-10-CM

## 2016-03-17 DIAGNOSIS — I480 Paroxysmal atrial fibrillation: Secondary | ICD-10-CM | POA: Diagnosis not present

## 2016-03-17 DIAGNOSIS — R072 Precordial pain: Secondary | ICD-10-CM

## 2016-03-17 MED ORDER — METOPROLOL TARTRATE 25 MG PO TABS: 25.0000 mg | ORAL_TABLET | Freq: Two times a day (BID) | ORAL | Status: DC

## 2016-03-17 NOTE — Patient Instructions (Signed)
Your physician wants you to follow-up in: ONE YEAR WITH DR CRENSHAW You will receive a reminder letter in the mail two months in advance. If you don't receive a letter, please call our office to schedule the follow-up appointment.   If you need a refill on your cardiac medications before your next appointment, please call your pharmacy.  

## 2016-03-17 NOTE — Assessment & Plan Note (Signed)
No evidence of recurrence. Continue aspirin and beta blocker.

## 2016-03-17 NOTE — Assessment & Plan Note (Signed)
No recent chest pain  

## 2016-03-17 NOTE — Progress Notes (Signed)
HPI: fu SVT as well as atrial fibrillation and mitral valve prolapse. Cardiac catheterization in September of 2002 showed normal coronary arteries and normal LV function with an ejection fraction of 65%. Myoview in 2007 showed EF 70 and normal perfusion. She has had a previous ablation of her atrial fibrillation in August 2007 [facility]. She has also had recurrent palpitations secondary to PVCs treated with beta blockade. Event monitor 12/14 showed sinus with brief PAT. Echocardiogram September 2015 showed normal LV function, trace mitral and tricuspid regurgitation. Since I last saw her She has had recent allergies and URI symptoms. Otherwise denies dyspnea, chest pain, pedal edema or syncope. Occasional brief flutters but no sustained palpitations.  Current Outpatient Prescriptions  Medication Sig Dispense Refill  . aspirin 81 MG chewable tablet Chew 81 mg by mouth daily.     . metoprolol tartrate (LOPRESSOR) 25 MG tablet Take 1 tablet (25 mg total) by mouth 2 (two) times daily. Patient needs to call our office to schedule an appointment for future refills. 30 tablet 0   No current facility-administered medications for this visit.     Past Medical History  Diagnosis Date  . Hearing loss     75%  . Swallowing difficulty     problems for second/need dentures  . Menopausal state   . Paroxysmal a-fib (HCC)     status post ablation  . Dyslipidemia   . MVP (mitral valve prolapse)   . IBS (irritable bowel syndrome)     remote history  . Hypertension   . Anal fistula   . Atrial fibrillation (HCC)   . Interstitial cystitis     Past Surgical History  Procedure Laterality Date  . Cesarean section  1971, 1973    x 2  . Nasal septum surgery  1977  . Pulmonary vein isolation  06-2006    procedure for AFIB /[facility]  . Cardiac electrophysiology mapping and ablation      for A fib    Social History   Social History  . Marital Status: Divorced    Spouse Name:  N/A  . Number of Children: 2  . Years of Education: N/A   Occupational History  . retired    Social History Main Topics  . Smoking status: Never Smoker   . Smokeless tobacco: Never Used  . Alcohol Use: No  . Drug Use: No  . Sexual Activity: Not on file     Comment: unemployed, GED, divorced,2 adult children, no caffeine, no regular exercise   Other Topics Concern  . Not on file   Social History Narrative    Family History  Problem Relation Age of Onset  . Hypertension Mother   . Diabetes Maternal Grandmother   . Heart disease Brother   . Stroke Paternal Aunt   . Heart attack Father   . Heart disease Sister     x 2    ROS: Recent cough because of allergies but no fevers or chills, hemoptysis, dysphasia, odynophagia, melena, hematochezia, dysuria, hematuria, rash, seizure activity, orthopnea, PND, pedal edema, claudication. Remaining systems are negative.  Physical Exam: Well-developed well-nourished in no acute distress.  Skin is warm and dry.  HEENT is normal.  Neck is supple.  Chest is clear to auscultation with normal expansion.  Cardiovascular exam is regular rate and rhythm.  Abdominal exam nontender or distended. No masses palpated. Extremities show no edema. neuro grossly intact  ECG Sinus rhythm at a rate of 70.No significant  ST changes.

## 2016-03-17 NOTE — Assessment & Plan Note (Signed)
Continue beta blocker. Symptoms reasonably well controlled.

## 2016-03-17 NOTE — Assessment & Plan Note (Signed)
Not evident on most recent echo. Trace mitral regurgitation.

## 2016-05-03 ENCOUNTER — Ambulatory Visit (INDEPENDENT_AMBULATORY_CARE_PROVIDER_SITE_OTHER): Payer: Medicare Other

## 2016-05-03 ENCOUNTER — Ambulatory Visit (INDEPENDENT_AMBULATORY_CARE_PROVIDER_SITE_OTHER): Payer: Medicare Other | Admitting: Physician Assistant

## 2016-05-03 ENCOUNTER — Encounter: Payer: Self-pay | Admitting: Physician Assistant

## 2016-05-03 VITALS — BP 134/55 | HR 67 | Ht 64.0 in | Wt 125.0 lb

## 2016-05-03 DIAGNOSIS — S2231XA Fracture of one rib, right side, initial encounter for closed fracture: Secondary | ICD-10-CM | POA: Diagnosis not present

## 2016-05-03 DIAGNOSIS — M810 Age-related osteoporosis without current pathological fracture: Secondary | ICD-10-CM

## 2016-05-03 DIAGNOSIS — M7989 Other specified soft tissue disorders: Secondary | ICD-10-CM | POA: Diagnosis not present

## 2016-05-03 DIAGNOSIS — Z1159 Encounter for screening for other viral diseases: Secondary | ICD-10-CM

## 2016-05-03 DIAGNOSIS — M79641 Pain in right hand: Secondary | ICD-10-CM | POA: Diagnosis not present

## 2016-05-03 DIAGNOSIS — R0781 Pleurodynia: Secondary | ICD-10-CM | POA: Diagnosis not present

## 2016-05-03 DIAGNOSIS — M79609 Pain in unspecified limb: Secondary | ICD-10-CM | POA: Diagnosis not present

## 2016-05-03 DIAGNOSIS — X58XXXA Exposure to other specified factors, initial encounter: Secondary | ICD-10-CM

## 2016-05-03 DIAGNOSIS — M79642 Pain in left hand: Secondary | ICD-10-CM

## 2016-05-03 DIAGNOSIS — M19042 Primary osteoarthritis, left hand: Secondary | ICD-10-CM

## 2016-05-03 DIAGNOSIS — R600 Localized edema: Secondary | ICD-10-CM

## 2016-05-03 DIAGNOSIS — M19041 Primary osteoarthritis, right hand: Secondary | ICD-10-CM

## 2016-05-03 NOTE — Progress Notes (Addendum)
   Subjective:    Patient ID: Stephanie Rollins, female    DOB: [birth year], 65 y.o.   MRN: [id number]  HPI  Pt is a 65 yo female with osteoporosis, not on treatment, with right sided pain. May 8th she was working in the yard and twisted. She heard pop and has had pain every since. Tylenol helps some. No coughing, fever, chills, SOB or wheezing.   She is also having some bilateral hand pain and swelling for the last 3 weeks worse. Tends to be worse in am and gets better throughout the day. Father had RA. She is concerned about RA. She has noticed some nodules around her joint. Not taking anything for it.   She does have some episodes of swelling in lower ankles. None today. Wearing compression stockings. Seemed to make her feel better.      Review of Systems  All other systems reviewed and are negative.      Objective:   Physical Exam  Constitutional: She is oriented to person, place, and time. She appears well-developed and well-nourished.  HENT:  Head: Normocephalic and atraumatic.  Cardiovascular: Normal rate, regular rhythm and normal heart sounds.   Pulmonary/Chest: Effort normal and breath sounds normal. She has no wheezes.  Tenderness to palpation over right ribs.   Musculoskeletal:  DIP nodules noted on bilateral hands.  No swelling seen today.  Hand grip 5/5.   Neurological: She is alert and oriented to person, place, and time.  Psychiatric: She has a normal mood and affect. Her behavior is normal.          Assessment & Plan:  Right rib pain-patient does have osteoporosis in has history of hearing a pop. Will order rib x-ray to make sure she does not have a fracture.  Xray confirmed fracture of right 9th rib. Offered norco pt declined. Per pt states she cannot take NSAID per cardiologist. Tylenol as needed for pain. Discussed healing time of 2-3 months.   Patient requests hep C screening today.  Bilateral hand swelling and pain- will xray today. Showed  osteoarthritis. Family hx of RA. Will check labs. Discussed with patient if one particular joint that hurts can consider injections. Some of symptoms could also represent some carpel tunnel symptoms. Will give exercises.   Lower extremity edema- continue with feet elevation, compression stockings. Discussed likely venous statsis. Follow up if not improving with conservative treatment.

## 2016-05-04 LAB — HEPATITIS C ANTIBODY: HCV AB: NEGATIVE

## 2016-05-05 DIAGNOSIS — S2231XA Fracture of one rib, right side, initial encounter for closed fracture: Secondary | ICD-10-CM | POA: Insufficient documentation

## 2016-05-05 DIAGNOSIS — R600 Localized edema: Secondary | ICD-10-CM | POA: Insufficient documentation

## 2016-05-05 LAB — CYCLIC CITRUL PEPTIDE ANTIBODY, IGG

## 2016-05-05 LAB — RHEUMATOID FACTOR

## 2016-05-06 ENCOUNTER — Telehealth: Payer: Self-pay

## 2016-05-06 ENCOUNTER — Encounter: Payer: Self-pay | Admitting: Family Medicine

## 2016-05-06 NOTE — Telephone Encounter (Signed)
OK for note. She will have to call you back with her juror number, date, etc.

## 2016-05-07 NOTE — Telephone Encounter (Signed)
For osteoarthritis there are no exercises. We considered some of her symptoms could be from  carpel tunnel.  Could print those out. Ibuprofen/tylenol could help with this. We also recommend night splints. Would she like to get fitted for those?

## 2016-05-07 NOTE — Telephone Encounter (Signed)
Attempted to call several times today with recommendations.  Left VM for patient to call office.

## 2016-05-11 ENCOUNTER — Ambulatory Visit: Payer: Medicare Other

## 2016-06-21 ENCOUNTER — Encounter: Payer: Self-pay | Admitting: Family Medicine

## 2016-06-21 DIAGNOSIS — H43813 Vitreous degeneration, bilateral: Secondary | ICD-10-CM | POA: Diagnosis not present

## 2016-06-23 ENCOUNTER — Telehealth: Payer: Self-pay | Admitting: *Deleted

## 2016-06-23 ENCOUNTER — Encounter: Payer: Self-pay | Admitting: *Deleted

## 2016-06-23 NOTE — Telephone Encounter (Signed)
Letter written and printed given to [name]. To be emailed to pt.Stephanie Rollins [name] Pt called and informed that she should get letter tomorrow morning.Stephanie Rollins [name]

## 2016-06-23 NOTE — Telephone Encounter (Signed)
Pt called and stated that she will need a letter stating that she has issues with her breathing. She reports that when she was previously employed she was exposed to chemicals it causes her to break out and affects her breathing. She reports that she needs this to get repairs for her home. Her roof is leaking and was told to put moth balls in her attic and this is a type of pesticide an has caused her to have a lot of issues with her breathing. She is asking that a letter be written so that this can be  Expedited quickly.  She would like this email: [email or web address]

## 2016-06-24 ENCOUNTER — Other Ambulatory Visit: Payer: Self-pay | Admitting: *Deleted

## 2016-09-14 ENCOUNTER — Ambulatory Visit (INDEPENDENT_AMBULATORY_CARE_PROVIDER_SITE_OTHER): Payer: Medicare Other | Admitting: Family Medicine

## 2016-09-14 ENCOUNTER — Encounter: Payer: Self-pay | Admitting: Family Medicine

## 2016-09-14 ENCOUNTER — Ambulatory Visit (INDEPENDENT_AMBULATORY_CARE_PROVIDER_SITE_OTHER): Payer: Medicare Other

## 2016-09-14 VITALS — BP 125/62 | HR 80 | Wt 121.0 lb

## 2016-09-14 DIAGNOSIS — R3915 Urgency of urination: Secondary | ICD-10-CM

## 2016-09-14 DIAGNOSIS — H539 Unspecified visual disturbance: Secondary | ICD-10-CM

## 2016-09-14 DIAGNOSIS — E7800 Pure hypercholesterolemia, unspecified: Secondary | ICD-10-CM | POA: Diagnosis not present

## 2016-09-14 DIAGNOSIS — R319 Hematuria, unspecified: Secondary | ICD-10-CM

## 2016-09-14 DIAGNOSIS — Z23 Encounter for immunization: Secondary | ICD-10-CM

## 2016-09-14 DIAGNOSIS — R0781 Pleurodynia: Secondary | ICD-10-CM

## 2016-09-14 DIAGNOSIS — R1013 Epigastric pain: Secondary | ICD-10-CM | POA: Diagnosis not present

## 2016-09-14 DIAGNOSIS — Z0000 Encounter for general adult medical examination without abnormal findings: Secondary | ICD-10-CM | POA: Diagnosis not present

## 2016-09-14 DIAGNOSIS — Z1211 Encounter for screening for malignant neoplasm of colon: Secondary | ICD-10-CM

## 2016-09-14 LAB — POCT URINALYSIS DIPSTICK
BILIRUBIN UA: NEGATIVE
GLUCOSE UA: NEGATIVE
Ketones, UA: NEGATIVE
Leukocytes, UA: NEGATIVE
Nitrite, UA: NEGATIVE
PH UA: 5
Protein, UA: NEGATIVE
SPEC GRAV UA: 1.02
Urobilinogen, UA: 0.2

## 2016-09-14 NOTE — Progress Notes (Signed)
Subjective:   Stephanie Rollins is a 65 y.o. female who presents for Medicare Annual (Subsequent) preventive examination.    She has been Having some urinary urgency.She says it's been going on since probably August. She says ever since she fractured her rib she's getting a frequent fullness sensation. She has a known diagnosis of interstitial cystitis and overflow incontinence. She last saw Dr. Katrinka BlazingSmith gynecology oncologist specialist like to October. They did a bladder treatment at that time. She passed out afterwards and felt like it was a reaction so never went back. She's not had any gross hematuria. Does report a history of chronic microscopic hematuria.  Epigastric pain -she says she's been getting some epigastric pain hardness and swelling intermittently for the last 3-4 months. She denies any recent changes in diet. She says eating or not eating does not seem to aggravate it. She does have a lot of gas and belching but this is not new. She is monitored in the past that she might have some lactose intolerance. She does eat cheese and ice cream. She says sometimes certain ice cream to upset her stomach.   She still complains of right lower rib pain-she had a mildly displaced fracture of the ninth right rib near the costochondral junction detected on x-ray on 05/03/2016. She still continues to have some sharp pains in that area and she is concerned it may not have healed well.  Review of Systems:  Comprehensive ROS is negative.        Objective:     Vitals: BP 125/62   Pulse 80   Wt 121 lb (54.9 kg)   SpO2 99%   BMI 20.77 kg/m   Body mass index is 20.77 kg/m.  Physical Exam  Constitutional: She is oriented to person, place, and time. She appears well-developed and well-nourished.  HENT:  Head: Normocephalic and atraumatic.  Right Ear: External ear normal.  Left Ear: External ear normal.  Nose: Nose normal.  Mouth/Throat: Oropharynx is clear and moist.  TMs and canals are  clear.   Eyes: Conjunctivae and EOM are normal. Pupils are equal, round, and reactive to light.  Neck: Neck supple. No thyromegaly present.  Cardiovascular: Normal rate, regular rhythm and normal heart sounds.   Pulmonary/Chest: Effort normal and breath sounds normal. She has no wheezes.  Abdominal: Soft. Bowel sounds are normal. She exhibits no distension. There is tenderness.  Tenderness over the epigastric area. No masses or organomegaly.  Musculoskeletal: She exhibits no edema.  Lymphadenopathy:    She has no cervical adenopathy.  Neurological: She is alert and oriented to person, place, and time.  Skin: Skin is warm and dry.  Psychiatric: She has a normal mood and affect.     Tobacco History  Smoking Status  . Never Smoker  Smokeless Tobacco  . Never Used     Counseling given: Not Answered   Past Medical History:  Diagnosis Date  . Anal fistula   . Atrial fibrillation (HCC)   . Dyslipidemia   . Hearing loss    75%  . Hypertension   . IBS (irritable bowel syndrome)    remote history  . Interstitial cystitis   . Menopausal state   . MVP (mitral valve prolapse)   . Paroxysmal a-fib (HCC)    status post ablation  . Swallowing difficulty    problems for second/need dentures   Past Surgical History:  Procedure Laterality Date  . CARDIAC ELECTROPHYSIOLOGY MAPPING AND ABLATION     for A  fib  . CESAREAN SECTION  1971, 1973   x 2  . NASAL SEPTUM SURGERY  1977  . pulmonary vein isolation  06-2006   procedure for AFIB /[facility]   Family History  Problem Relation Age of Onset  . Hypertension Mother   . Diabetes Maternal Grandmother   . Heart disease Brother   . Stroke Paternal Aunt   . Heart attack Father   . Heart disease Sister     x 2   History  Sexual Activity  . Sexual activity: Not on file    Comment: unemployed, GED, divorced,2 adult children, no caffeine, no regular exercise    Outpatient Encounter Prescriptions as of 09/14/2016   Medication Sig  . aspirin 81 MG chewable tablet Chew 81 mg by mouth daily.   . metoprolol tartrate (LOPRESSOR) 25 MG tablet Take 1 tablet (25 mg total) by mouth 2 (two) times daily.   No facility-administered encounter medications on file as of 09/14/2016.     Activities of Daily Living In your present state of health, do you have any difficulty performing the following activities: 09/14/2016  Hearing? Y  Vision? N  Difficulty concentrating or making decisions? N  Walking or climbing stairs? N  Dressing or bathing? N  Doing errands, shopping? N  Some recent data might be hidden    Patient Care Team: Agapito Games, MD as PCP - General    Assessment:     Medicare Wellness Exam   Exercise Activities and Dietary recommendations    Goals    None     Fall Risk Fall Risk  09/14/2016  Falls in the past year? No   Depression Screen PHQ 2/9 Scores 09/14/2016  PHQ - 2 Score 0     Cognitive Function     6CIT Screen 09/14/2016  What Year? 0 points  What month? 0 points  What time? 0 points  Count back from 20 0 points  Months in reverse 0 points  Repeat phrase 2 points  Total Score 2    Immunization History  Administered Date(s) Administered  . Influenza Split 10/18/2011, 10/03/2012  . Influenza Whole 09/22/2007, 09/06/2008, 09/24/2009, 09/23/2010  . Influenza,inj,Quad PF,36+ Mos 08/30/2013, 08/21/2014, 09/04/2015  . Td 11/22/2000  . Tdap 03/17/2011   Screening Tests Health Maintenance  Topic Date Due  . ZOSTAVAX  06/08/2009  . MAMMOGRAM  10/03/2014  . COLONOSCOPY  03/17/2016  . INFLUENZA VACCINE  06/22/2016  . PNA vac Low Risk Adult (1 of 2 - PCV13) 09/14/2017 (Originally 06/08/2014)  . TETANUS/TDAP  03/16/2021  . DEXA SCAN  Completed  . Hepatitis C Screening  Completed      Plan:    During the course of the visit the patient was educated and counseled about the following appropriate screening and preventive services:   Vaccines to include  Pneumoccal, Influenza, Hepatitis B, Td, Zostavax, HCV  Cardiovascular Disease  Colorectal cancer screening - will refer back to GI.  amb gastro  Bone density screening  Diabetes screening  Glaucoma screening  Mammography/PAP - she no longer needs a pap smear.  Did encourage her to get her mammogram done.   1) Urinary urgency-and did encourage her to follow back up with her gynecologist urologist since it has been a year since she's been there.  2) Epigastric pain and tenderness-could be GERD or gastritis. She's not really on any medications that should be causing symptoms. Will send of her PPI to take the next 14 days to see if  it improves her symptoms. Recommend GI referral as well she's also due for her 10 year repeat screening  Colonoscopy.  3) right-sided rib pain. She had known fracture several months ago. She still continues to have some sharp pain in that area and says she is even worried to go get her mammogram because she's afraid that she will crack another bone. She is worried that it may not have healed properly. Plan to repeat rib films today though it likely will not alter treatment options for her.\  4) several reports some vision change with some red spots in her vision. She said it happened last of February. She went for her eye exam this summer and did mention to it to them but they did not do dilated eye exam. Encouraged her that she still having from she needs to get back in and have a dilated eye exam with a can look at the back of the eye for further evaluation.  Patient Instructions (the written plan) was given to the patient.   Martiza Speth, MD  09/14/2016

## 2016-09-15 LAB — CBC WITH DIFFERENTIAL/PLATELET
BASOS ABS: 0 {cells}/uL (ref 0–200)
Basophils Relative: 0 %
EOS PCT: 1 %
Eosinophils Absolute: 52 cells/uL (ref 15–500)
HCT: 37.1 % (ref 35.0–45.0)
HEMOGLOBIN: 12.3 g/dL (ref 11.7–15.5)
LYMPHS PCT: 33 %
Lymphs Abs: 1716 cells/uL (ref 850–3900)
MCH: 29.5 pg (ref 27.0–33.0)
MCHC: 33.2 g/dL (ref 32.0–36.0)
MCV: 89 fL (ref 80.0–100.0)
MONOS PCT: 7 %
MPV: 10.7 fL (ref 7.5–12.5)
Monocytes Absolute: 364 cells/uL (ref 200–950)
NEUTROS PCT: 59 %
Neutro Abs: 3068 cells/uL (ref 1500–7800)
PLATELETS: 189 10*3/uL (ref 140–400)
RBC: 4.17 MIL/uL (ref 3.80–5.10)
RDW: 13.5 % (ref 11.0–15.0)
WBC: 5.2 10*3/uL (ref 3.8–10.8)

## 2016-09-15 LAB — COMPLETE METABOLIC PANEL WITH GFR
ALBUMIN: 4.5 g/dL (ref 3.6–5.1)
ALT: 11 U/L (ref 6–29)
AST: 17 U/L (ref 10–35)
Alkaline Phosphatase: 62 U/L (ref 33–130)
BUN: 6 mg/dL — AB (ref 7–25)
CALCIUM: 9.4 mg/dL (ref 8.6–10.4)
CHLORIDE: 104 mmol/L (ref 98–110)
CO2: 26 mmol/L (ref 20–31)
CREATININE: 0.67 mg/dL (ref 0.50–0.99)
GFR, Est African American: >89 mL/min (ref >=60)
GFR, Est Non African American: >89 mL/min (ref >=60)
GLUCOSE: 99 mg/dL (ref 65–99)
POTASSIUM: 3.5 mmol/L (ref 3.5–5.3)
SODIUM: 141 mmol/L (ref 135–146)
Total Bilirubin: 1 mg/dL (ref 0.2–1.2)
Total Protein: 7.2 g/dL (ref 6.1–8.1)

## 2016-09-15 LAB — URINALYSIS, MICROSCOPIC ONLY
Bacteria, UA: NONE SEEN [HPF]
CRYSTALS: NONE SEEN [HPF]
Casts: NONE SEEN [LPF]
RBC / HPF: NONE SEEN RBC/HPF (ref <=2)
WBC UA: NONE SEEN WBC/HPF (ref <=5)
Yeast: NONE SEEN [HPF]

## 2016-09-15 LAB — URINALYSIS, ROUTINE W REFLEX MICROSCOPIC
BILIRUBIN URINE: NEGATIVE
Glucose, UA: NEGATIVE
KETONES UR: NEGATIVE
Leukocytes, UA: NEGATIVE
Nitrite: NEGATIVE
Protein, ur: NEGATIVE
SPECIFIC GRAVITY, URINE: 1.014 (ref 1.001–1.035)
pH: 5 (ref 5.0–8.0)

## 2016-09-15 LAB — LIPID PANEL
CHOL/HDL RATIO: 2.6 ratio (ref <=5.0)
Cholesterol: 164 mg/dL (ref 125–200)
HDL: 63 mg/dL (ref >=46)
LDL CALC: 82 mg/dL (ref <130)
TRIGLYCERIDES: 97 mg/dL (ref <150)
VLDL: 19 mg/dL (ref <30)

## 2016-11-18 ENCOUNTER — Encounter: Payer: Self-pay | Admitting: Family Medicine

## 2016-11-24 DIAGNOSIS — S2232XA Fracture of one rib, left side, initial encounter for closed fracture: Secondary | ICD-10-CM | POA: Diagnosis not present

## 2016-11-24 DIAGNOSIS — Z880 Allergy status to penicillin: Secondary | ICD-10-CM | POA: Diagnosis not present

## 2016-11-24 DIAGNOSIS — R0789 Other chest pain: Secondary | ICD-10-CM | POA: Diagnosis not present

## 2016-11-24 DIAGNOSIS — I4891 Unspecified atrial fibrillation: Secondary | ICD-10-CM | POA: Diagnosis not present

## 2016-11-24 DIAGNOSIS — Z91013 Allergy to seafood: Secondary | ICD-10-CM | POA: Diagnosis not present

## 2016-11-24 DIAGNOSIS — R0781 Pleurodynia: Secondary | ICD-10-CM | POA: Diagnosis not present

## 2016-11-24 DIAGNOSIS — M810 Age-related osteoporosis without current pathological fracture: Secondary | ICD-10-CM | POA: Diagnosis not present

## 2016-11-24 DIAGNOSIS — Z79899 Other long term (current) drug therapy: Secondary | ICD-10-CM | POA: Diagnosis not present

## 2016-11-24 DIAGNOSIS — Z7982 Long term (current) use of aspirin: Secondary | ICD-10-CM | POA: Diagnosis not present

## 2016-11-24 DIAGNOSIS — Z888 Allergy status to other drugs, medicaments and biological substances status: Secondary | ICD-10-CM | POA: Diagnosis not present

## 2016-11-24 DIAGNOSIS — S299XXA Unspecified injury of thorax, initial encounter: Secondary | ICD-10-CM | POA: Diagnosis not present

## 2016-11-24 DIAGNOSIS — Z885 Allergy status to narcotic agent status: Secondary | ICD-10-CM | POA: Diagnosis not present

## 2016-11-24 DIAGNOSIS — I4892 Unspecified atrial flutter: Secondary | ICD-10-CM | POA: Diagnosis not present

## 2016-11-24 DIAGNOSIS — R079 Chest pain, unspecified: Secondary | ICD-10-CM | POA: Diagnosis not present

## 2016-12-02 ENCOUNTER — Telehealth: Payer: Self-pay | Admitting: *Deleted

## 2016-12-02 DIAGNOSIS — M810 Age-related osteoporosis without current pathological fracture: Secondary | ICD-10-CM

## 2016-12-02 NOTE — Telephone Encounter (Signed)
Bone density ordered.

## 2016-12-20 ENCOUNTER — Encounter: Payer: Self-pay | Admitting: Family Medicine

## 2016-12-20 ENCOUNTER — Ambulatory Visit (INDEPENDENT_AMBULATORY_CARE_PROVIDER_SITE_OTHER): Payer: Medicare Other | Admitting: Family Medicine

## 2016-12-20 ENCOUNTER — Ambulatory Visit (INDEPENDENT_AMBULATORY_CARE_PROVIDER_SITE_OTHER): Payer: Medicare Other

## 2016-12-20 VITALS — BP 117/63 | HR 65 | Wt 119.0 lb

## 2016-12-20 DIAGNOSIS — S2242XD Multiple fractures of ribs, left side, subsequent encounter for fracture with routine healing: Secondary | ICD-10-CM | POA: Diagnosis not present

## 2016-12-20 DIAGNOSIS — S2242XA Multiple fractures of ribs, left side, initial encounter for closed fracture: Secondary | ICD-10-CM | POA: Diagnosis not present

## 2016-12-20 DIAGNOSIS — W19XXXD Unspecified fall, subsequent encounter: Secondary | ICD-10-CM

## 2016-12-20 DIAGNOSIS — S2232XA Fracture of one rib, left side, initial encounter for closed fracture: Secondary | ICD-10-CM | POA: Diagnosis not present

## 2016-12-20 DIAGNOSIS — E559 Vitamin D deficiency, unspecified: Secondary | ICD-10-CM

## 2016-12-20 DIAGNOSIS — M8000XA Age-related osteoporosis with current pathological fracture, unspecified site, initial encounter for fracture: Secondary | ICD-10-CM

## 2016-12-20 DIAGNOSIS — M4854XS Collapsed vertebra, not elsewhere classified, thoracic region, sequela of fracture: Secondary | ICD-10-CM

## 2016-12-20 NOTE — Patient Instructions (Signed)
Can look up Prolia.

## 2016-12-20 NOTE — Progress Notes (Addendum)
Subjective:    CC: Hospital F/U  HPI:  66 year old female here is here today to follow-up for rib fracture. Car door hit her left side she does have a history of osteoporosis and has fractured ribs in the past. They did do an x-ray which confirmed a left anterior fifth rib fracture and a T7 compression fracture though this could be remote. He was given Tylenol and Lidoderm patch. She didn't use the patch as she was worried about the s.E. She's been mostly using ice and Tylenol for pain relief. She did ice for about the first 12 days and then switch to heat.  He was also noted to have a compression fracture at T7 but she's not having any pain there. I suspect this was an old fracture.  Past medical history, Surgical history, Family history not pertinant except as noted below, Social history, Allergies, and medications have been entered into the medical record, reviewed, and corrections made.   Review of Systems: No fevers, chills, night sweats, weight loss, chest pain, or shortness of breath.   Objective:    General: Well Developed, well nourished, and in no acute distress.  Neuro: Alert and oriented x3, extra-ocular muscles intact, sensation grossly intact.  HEENT: Normocephalic, atraumatic  Skin: Warm and dry, no rashes. Cardiac: Regular rate and rhythm, no murmurs rubs or gallops, no lower extremity edema.  Respiratory: Clear to auscultation bilaterally. Not using accessory muscles, speaking in full sentences.   Impression and Recommendations:    Left fifth rib fracture, anterior-  Since she I still feeling popping, recommend repeat x-ray today just for further evaluation and to make sure that it is healing.  Old compression fracture at T7. Stable no current pain. Again recommended treatment for osteoporosis.  Osteoporosis - she says she has read about the biphosphonates and she doesn't like the s.e. Profile and doesn't want to take them.  She is taking calcium daily. Says that we  really should check a vitamin D level to make sure that it's adequate that she is maximally absorbing the calcium. Also recommend some regular exercise and consider Prolia as an option.  Vitamin D deficiency-due to recheck levels. I suspect she is probably still low she's not currently taking any supplementation.

## 2016-12-21 DIAGNOSIS — M4854XS Collapsed vertebra, not elsewhere classified, thoracic region, sequela of fracture: Secondary | ICD-10-CM | POA: Insufficient documentation

## 2016-12-21 LAB — VITAMIN D 25 HYDROXY (VIT D DEFICIENCY, FRACTURES): VIT D 25 HYDROXY: 40 ng/mL (ref 30–100)

## 2017-03-19 ENCOUNTER — Other Ambulatory Visit: Payer: Self-pay | Admitting: Cardiology

## 2017-03-19 DIAGNOSIS — I480 Paroxysmal atrial fibrillation: Secondary | ICD-10-CM

## 2017-03-21 NOTE — Telephone Encounter (Signed)
REFILL 

## 2017-04-18 DIAGNOSIS — I4892 Unspecified atrial flutter: Secondary | ICD-10-CM | POA: Diagnosis not present

## 2017-04-18 DIAGNOSIS — S80262A Insect bite (nonvenomous), left knee, initial encounter: Secondary | ICD-10-CM | POA: Diagnosis not present

## 2017-04-18 DIAGNOSIS — Z91013 Allergy to seafood: Secondary | ICD-10-CM | POA: Diagnosis not present

## 2017-04-18 DIAGNOSIS — Z79899 Other long term (current) drug therapy: Secondary | ICD-10-CM | POA: Diagnosis not present

## 2017-04-18 DIAGNOSIS — I4891 Unspecified atrial fibrillation: Secondary | ICD-10-CM | POA: Diagnosis not present

## 2017-04-18 DIAGNOSIS — Z7982 Long term (current) use of aspirin: Secondary | ICD-10-CM | POA: Diagnosis not present

## 2017-04-18 DIAGNOSIS — Z880 Allergy status to penicillin: Secondary | ICD-10-CM | POA: Diagnosis not present

## 2017-04-18 DIAGNOSIS — S30861A Insect bite (nonvenomous) of abdominal wall, initial encounter: Secondary | ICD-10-CM | POA: Diagnosis not present

## 2017-04-18 DIAGNOSIS — I493 Ventricular premature depolarization: Secondary | ICD-10-CM | POA: Diagnosis not present

## 2017-04-18 DIAGNOSIS — Z886 Allergy status to analgesic agent status: Secondary | ICD-10-CM | POA: Diagnosis not present

## 2017-05-18 ENCOUNTER — Other Ambulatory Visit: Payer: Self-pay | Admitting: Cardiology

## 2017-05-18 DIAGNOSIS — I480 Paroxysmal atrial fibrillation: Secondary | ICD-10-CM

## 2017-06-07 ENCOUNTER — Telehealth: Payer: Self-pay

## 2017-06-07 NOTE — Telephone Encounter (Signed)
Please have her schedule an appt

## 2017-06-07 NOTE — Telephone Encounter (Signed)
Called and lvm asking that pt return call and make an appt for the issues that she is experiencing as this cannot be addressed by not being seen .[name], [name]

## 2017-06-07 NOTE — Telephone Encounter (Signed)
Pt states she has had rashes/hives that she's had come up sporadically on different areas, more recently both arms. Pt was seen in ER for tick bite on 04/16/17 and would like to know if she should get labs for lyme disease etc. Pt would also like to have her ferritin checked due to weight loss, hair loss and brittle nails. Please advise.

## 2017-06-08 ENCOUNTER — Telehealth: Payer: Self-pay | Admitting: *Deleted

## 2017-06-08 NOTE — Telephone Encounter (Signed)
Left message for pt to call. She called into the [place] office and left a message for me to call her.

## 2017-06-22 ENCOUNTER — Other Ambulatory Visit: Payer: Self-pay | Admitting: *Deleted

## 2017-06-22 DIAGNOSIS — I480 Paroxysmal atrial fibrillation: Secondary | ICD-10-CM

## 2017-06-22 MED ORDER — METOPROLOL TARTRATE 25 MG PO TABS: 25.0000 mg | ORAL_TABLET | Freq: Two times a day (BID) | ORAL | 3 refills | Status: DC

## 2017-06-23 NOTE — Telephone Encounter (Signed)
Spoke with pt, she is requesting an appointment to be seen. She has been having an occ fluttering in her chest. Follow up scheduled

## 2017-08-04 NOTE — Progress Notes (Deleted)
HPI: fu SVT as well as atrial fibrillation and mitral valve prolapse. Cardiac catheterization in September of 2002 showed normal coronary arteries and normal LV function with an ejection fraction of 65%. Myoview in 2007 showed EF 70 and normal perfusion. She has had a previous [facility] of her atrial fibrillation [facility]. She has also had recurrent palpitations secondary to PVCs treated with beta blockade. Event monitor 12/14 showed sinus with brief PAT. Echocardiogram September 2015 showed normal LV function, trace mitral and tricuspid regurgitation. Since I last saw her   Current Outpatient Prescriptions  Medication Sig Dispense Refill  . aspirin 81 MG chewable tablet Chew 81 mg by mouth daily.     . Calcium Carbonate (CAL-CO3S PO) Take by mouth.    . Cyanocobalamin (VITAMIN B 12 PO) Take 1,000 mcg by mouth.    . metoprolol tartrate (LOPRESSOR) 25 MG tablet Take 1 tablet (25 mg total) by mouth 2 (two) times daily. 180 tablet 3  . Multiple Vitamin (MULTIVITAMIN) capsule Take by mouth.     No current facility-administered medications for this visit.      Past Medical History:  Diagnosis Date  . Anal fistula   . Atrial fibrillation (HCC)   . Dyslipidemia   . Hearing loss    75%  . Hypertension   . IBS (irritable bowel syndrome)    remote history  . Interstitial cystitis   . Menopausal state   . MVP (mitral valve prolapse)   . Paroxysmal a-fib (HCC)    status post ablation  . Swallowing difficulty    problems for second/need dentures    Past Surgical History:  Procedure Laterality Date  . CARDIAC ELECTROPHYSIOLOGY MAPPING AND ABLATION     for A fib  . CESAREAN SECTION  1971, 1973   x 2  . NASAL SEPTUM SURGERY  1977  . pulmonary vein isolation  06-2006   procedure for AFIB /[facility]    Social History   Social History  . Marital status: Divorced    Spouse name: N/A  . Number of children: 2  . Years of education: N/A    Occupational History  . retired    Social History Main Topics  . Smoking status: Never Smoker  . Smokeless tobacco: Never Used  . Alcohol use No  . Drug use: No  . Sexual activity: Not on file     Comment: unemployed, GED, divorced,2 adult children, no caffeine, no regular exercise   Other Topics Concern  . Not on file   Social History Narrative  . No narrative on file    Family History  Problem Relation Age of Onset  . Hypertension Mother   . Diabetes Maternal Grandmother   . Heart disease Brother   . Stroke Paternal Aunt   . Heart attack Father   . Heart disease Sister        x 2    ROS: no fevers or chills, productive cough, hemoptysis, dysphasia, odynophagia, melena, hematochezia, dysuria, hematuria, rash, seizure activity, orthopnea, PND, pedal edema, claudication. Remaining systems are negative.  Physical Exam: Well-developed well-nourished in no acute distress.  Skin is warm and dry.  HEENT is normal.  Neck is supple.  Chest is clear to auscultation with normal expansion.  Cardiovascular exam is regular rate and rhythm.  Abdominal exam nontender or distended. No masses palpated. Extremities show no edema. neuro grossly intact  ECG- personally reviewed  A/P  1  Olga MillersBrian Inanna Telford, MD

## 2017-08-05 ENCOUNTER — Encounter: Payer: Self-pay | Admitting: Cardiology

## 2017-08-17 ENCOUNTER — Ambulatory Visit: Payer: Medicare Other | Admitting: Cardiology

## 2017-08-31 ENCOUNTER — Ambulatory Visit (INDEPENDENT_AMBULATORY_CARE_PROVIDER_SITE_OTHER): Payer: Medicare Other | Admitting: Family Medicine

## 2017-08-31 VITALS — BP 124/67 | HR 74 | Temp 98.5°F | Ht 64.0 in | Wt 112.6 lb

## 2017-08-31 DIAGNOSIS — Z23 Encounter for immunization: Secondary | ICD-10-CM | POA: Diagnosis not present

## 2017-08-31 NOTE — Progress Notes (Unsigned)
   Subjective:    Patient ID: Stephanie Rollins, female    DOB: [birth year], 66 y.o.   MRN: [id number]  HPI Pt is here for flu vaccine. Pt denies egg allergy, fever, chest pain, SOB, or any other problems.     Review of Systems     Objective:   Physical Exam        Assessment & Plan:  Pt tolerated injection in Right deltoid well and without complications.

## 2017-09-20 ENCOUNTER — Encounter: Payer: Self-pay | Admitting: Family Medicine

## 2017-09-20 ENCOUNTER — Ambulatory Visit (INDEPENDENT_AMBULATORY_CARE_PROVIDER_SITE_OTHER): Payer: Medicare Other | Admitting: Family Medicine

## 2017-09-20 VITALS — BP 129/59 | HR 65 | Ht 64.0 in | Wt 113.0 lb

## 2017-09-20 DIAGNOSIS — H9190 Unspecified hearing loss, unspecified ear: Secondary | ICD-10-CM | POA: Insufficient documentation

## 2017-09-20 DIAGNOSIS — E348 Other specified endocrine disorders: Secondary | ICD-10-CM | POA: Diagnosis not present

## 2017-09-20 DIAGNOSIS — I4891 Unspecified atrial fibrillation: Secondary | ICD-10-CM | POA: Diagnosis not present

## 2017-09-20 DIAGNOSIS — W57XXXA Bitten or stung by nonvenomous insect and other nonvenomous arthropods, initial encounter: Secondary | ICD-10-CM

## 2017-09-20 DIAGNOSIS — R634 Abnormal weight loss: Secondary | ICD-10-CM

## 2017-09-20 DIAGNOSIS — R1906 Epigastric swelling, mass or lump: Secondary | ICD-10-CM

## 2017-09-20 DIAGNOSIS — L659 Nonscarring hair loss, unspecified: Secondary | ICD-10-CM

## 2017-09-20 DIAGNOSIS — H9193 Unspecified hearing loss, bilateral: Secondary | ICD-10-CM

## 2017-09-20 DIAGNOSIS — G8929 Other chronic pain: Secondary | ICD-10-CM

## 2017-09-20 DIAGNOSIS — M810 Age-related osteoporosis without current pathological fracture: Secondary | ICD-10-CM

## 2017-09-20 DIAGNOSIS — Z0000 Encounter for general adult medical examination without abnormal findings: Secondary | ICD-10-CM | POA: Diagnosis not present

## 2017-09-20 DIAGNOSIS — M25561 Pain in right knee: Secondary | ICD-10-CM

## 2017-09-20 NOTE — Progress Notes (Signed)
Subjective:    CC: Additional concerns today. , tick bite, etc   HPI: She also wanted to discuss a couple other issues today.  Over the summer after working out in the yard she found 2 ticks on her.  One on her left leg and one on her right side of her abdomen above the hip area.  She said within a couple days she noticed a rash around the area and then a couple weeks later actually had hives.  After the initial tick bite she was seen in the emergency department and placed on doxycycline.  She said that they had mentioned to her about getting tested for Lyme's disease.  She is concerned about this and would like to be tested.  She also complains of thinning hair and brittle nails.  She does not currently take any type of multivitamin and sometimes takes calcium with vitamin D when she is able to afford it.  She is also extremely concerned about weight loss over the last several years.  She is worried about the potential for diabetes and cancer.  She says diabetes runs in her family.  She is overdue for her mammogram and colon cancer screening.  Right knee pain pain-she says sometimes behind the knee and was feels tight and swollen particularly she has been very active working out in the yard.  It usually gets better after a few days.  She does not really take medication for it.  She also complains of a fullness sensation in the upper abdomen.  It can goes up into the chest area and creates a lot of pressure.  If she is able to belch or pass gas and she gets relief.  BP (!) 129/59   Pulse 65   Ht 5\' 4"  (1.626 m)   Wt 113 lb (51.3 kg)   SpO2 100%   BMI 19.40 kg/m     Allergies  Allergen Reactions  . Amoxicillin Rash and Other (See Comments)  . Ampicillin   . Azithromycin     REACTION: SOB  . Cephalexin   . Clarithromycin   . Codeine   . Flecainide   . Penicillins     REACTION: rash  . Shellfish Allergy     Sick to stomach   . Shellfish-Derived Products Hives and Nausea And  Vomiting    Past Medical History:  Diagnosis Date  . Anal fistula   . Atrial fibrillation (HCC)   . Dyslipidemia   . Hearing loss    75%  . Hypertension   . IBS (irritable bowel syndrome)    remote history  . Interstitial cystitis   . Menopausal state   . MVP (mitral valve prolapse)   . Paroxysmal A-fib (HCC)    status post ablation  . Swallowing difficulty    problems for second/need dentures    Past Surgical History:  Procedure Laterality Date  . CARDIAC ELECTROPHYSIOLOGY MAPPING AND ABLATION     for A fib  . CESAREAN SECTION  1971, 1973   x 2  . NASAL SEPTUM SURGERY  1977  . pulmonary vein isolation  06-2006   procedure for AFIB /[facility]    Social History   Social History  . Marital status: Divorced    Spouse name: N/A  . Number of children: 2  . Years of education: N/A   Occupational History  . retired    Social History Main Topics  . Smoking status: Never Smoker  . Smokeless tobacco: Never Used  .  Alcohol use No  . Drug use: No  . Sexual activity: Not on file     Comment: unemployed, GED, divorced,2 adult children, no caffeine, no regular exercise   Other Topics Concern  . Not on file   Social History Narrative  . No narrative on file    Family History  Problem Relation Age of Onset  . Hypertension Mother   . Diabetes Maternal Grandmother   . Heart disease Brother   . Stroke Paternal Aunt   . Heart attack Father   . Heart disease Sister        x 2    Outpatient Encounter Prescriptions as of 09/20/2017  Medication Sig  . aspirin 81 MG chewable tablet Chew 81 mg by mouth daily.   Marland Kitchen. CALCIUM PO Take 1 tablet by mouth daily.  . metoprolol tartrate (LOPRESSOR) 25 MG tablet Take 1 tablet (25 mg total) by mouth 2 (two) times daily.  . Cyanocobalamin (VITAMIN B 12 PO) Take 1,000 mcg by mouth.  . Multiple Vitamin (MULTIVITAMIN) capsule Take by mouth.   No facility-administered encounter medications on file as of 09/20/2017.         Objective:    General: Well Developed, well nourished, and in no acute distress.  Neuro: Alert and oriented x3, extra-ocular muscles intact, sensation grossly intact.  HEENT: Normocephalic, atraumatic, oropharynx is clear, TMs and canals are clear bilaterally.  No significant cervical lymphadenopathy.  No thyromegaly. Skin: Warm and dry, no rashes. Cardiac: Regular rate and rhythm, no murmurs rubs or gallops, no lower extremity edema.  Respiratory: Clear to auscultation bilaterally. Not using accessory muscles, speaking in full sentences. Abd: Soft, tender in the epigastric area.   Impression and Recommendations:      Tick Bite -she did develop a rash very quickly after the tick bite on her right side.  We will check for Lyme's titers but explained to her since she had a treatment of doxycycline this is likely a non-issue at this point but certainly we can check those titers per her preference.  Weight loss-her weight has actually been up and down since 2013.  She weighed 120 pounds in 2013 and today weighs around 113 pounds.  Her weight has been as low as 111 pounds back in 2015 and as high as 125 pounds back in 2017.  She has not dropped past her lowest weight which was 2 years ago.  Labs.  Thyroid etc. is normal then we will just encourage her to try to increase her caloric intake.  Thinning hair/brittle nails-we will encourage her to consider taking a daily multivitamin particularly since she is losing weight.  Right knee pain-suspect that she probably has a Baker's cyst that on occasion swells and causes some stiffness.  Right now I do not see any evidence of stiffness and she is not currently having pain.  If it becomes more persistent then we can get her in with 1 of our sports medicine providers.  Wants to be rechecked for anemia-we will recheck CBC and iron stores.  She has had problems with low iron in the past.  Osteoporosis-we will check for vitamin D.  Epigastric  fullness-suspect she probably has a hiatal hernia.  Vitamin D deficiency-she has had low vitamin D in the past and would like to have it checked again.  She does currently take a supplement when she is able to afford it.

## 2017-09-20 NOTE — Progress Notes (Signed)
Subjective:   Stephanie Rollins is a 66 y.o. female who presents for Medicare Annual (Subsequent) preventive examination.  Does have some chronic hearing loss.  Review of Systems:  Negative except for HPI in additional note.       Objective:     Vitals: BP (!) 129/59   Pulse 65   Ht 5\' 4"  (1.626 m)   Wt 113 lb (51.3 kg)   SpO2 100%   BMI 19.40 kg/m   Body mass index is 19.4 kg/m.   Tobacco History  Smoking Status  . Never Smoker  Smokeless Tobacco  . Never Used     Counseling given: Not Answered   Past Medical History:  Diagnosis Date  . Anal fistula   . Atrial fibrillation (HCC)   . Dyslipidemia   . Hearing loss    75%  . Hypertension   . IBS (irritable bowel syndrome)    remote history  . Interstitial cystitis   . Menopausal state   . MVP (mitral valve prolapse)   . Paroxysmal A-fib (HCC)    status post ablation  . Swallowing difficulty    problems for second/need dentures   Past Surgical History:  Procedure Laterality Date  . CARDIAC ELECTROPHYSIOLOGY MAPPING AND ABLATION     for A fib  . CESAREAN SECTION  1971, 1973   x 2  . NASAL SEPTUM SURGERY  1977  . pulmonary vein isolation  06-2006   procedure for AFIB /[facility]   Family History  Problem Relation Age of Onset  . Hypertension Mother   . Diabetes Maternal Grandmother   . Heart disease Brother   . Stroke Paternal Aunt   . Heart attack Father   . Heart disease Sister        x 2   History  Sexual Activity  . Sexual activity: Not on file    Comment: unemployed, GED, divorced,2 adult children, no caffeine, no regular exercise    Outpatient Encounter Prescriptions as of 09/20/2017  Medication Sig  . aspirin 81 MG chewable tablet Chew 81 mg by mouth daily.   Marland Kitchen CALCIUM PO Take 1 tablet by mouth daily.  . metoprolol tartrate (LOPRESSOR) 25 MG tablet Take 1 tablet (25 mg total) by mouth 2 (two) times daily.  . Cyanocobalamin (VITAMIN B 12 PO) Take 1,000 mcg by mouth.  .  Multiple Vitamin (MULTIVITAMIN) capsule Take by mouth.   No facility-administered encounter medications on file as of 09/20/2017.     Activities of Daily Living In your present state of health, do you have any difficulty performing the following activities: 09/20/2017  Hearing? Y  Vision? N  Difficulty concentrating or making decisions? N  Walking or climbing stairs? N  Dressing or bathing? N  Doing errands, shopping? N  Some recent data might be hidden    Patient Care Team: Agapito Games, MD as PCP - General    Assessment:    Medicare WEllness  Exercise Activities and Dietary recommendations Current Exercise Habits: The patient does not participate in regular exercise at present, Exercise limited by: cardiac condition(s)  Goals    None     Fall Risk Fall Risk  09/20/2017 09/14/2016  Falls in the past year? No No   Depression Screen PHQ 2/9 Scores 09/20/2017 08/31/2017 09/14/2016  PHQ - 2 Score 0 0 0     Cognitive Function     6CIT Screen 09/20/2017 09/14/2016  What Year? 0 points 0 points  What month? 0 points  0 points  What time? 0 points 0 points  Count back from 20 0 points 0 points  Months in reverse 0 points 0 points  Repeat phrase 4 points 2 points  Total Score 4 2    Immunization History  Administered Date(s) Administered  . Influenza Split 10/18/2011, 10/03/2012  . Influenza Whole 09/22/2007, 09/06/2008, 09/24/2009, 09/23/2010  . Influenza, High Dose Seasonal PF 08/31/2017  . Influenza,inj,Quad PF,6+ Mos 08/30/2013, 08/21/2014, 09/04/2015, 09/14/2016  . Td 11/22/2000  . Tdap 03/17/2011   Screening Tests Health Maintenance  Topic Date Due  . PNA vac Low Risk Adult (1 of 2 - PCV13) 06/08/2014  . COLONOSCOPY  03/17/2016  . MAMMOGRAM  09/23/2017 (Originally 10/03/2014)  . TETANUS/TDAP  03/16/2021  . INFLUENZA VACCINE  Completed  . DEXA SCAN  Completed  . Hepatitis C Screening  Completed      Plan:    I have personally reviewed  and noted the following in the patient's chart:   . Medical and social history - updated  . Use of alcohol, tobacco or illicit drugs  . Current medications and supplements . Functional ability and status - good . Nutritional status . Physical activity  -no regular exercise currently. . Advanced directives -she has spoken to her family about her preference but does not have any formal paperwork.  We discussed that today. . List of other physicians- Dr. Clearance CootsHarper . Hospitalizations, surgeries, and ER visits in previous 12 months . Vitals . Screenings to include cognitive, depression, and falls . Referrals and appointments  In addition, I have reviewed and discussed with patient certain preventive protocols, quality metrics, and best practice recommendations. A written personalized care plan for preventive services as well as general preventive health recommendations were provided to patient.     Nani Gasseratherine Metheney, MD  09/20/2017

## 2017-09-20 NOTE — Patient Instructions (Addendum)
Please schedule your mammogram.  You are welcome to go downstairs while you are here today in our building to schedule this at your convenience.  You will be sent a kit to your door for the Cologuard which is a stool test to screen for colon cancer.  Please complete that and return it in the mail.  We will call you once the results are available.  Encouraged regular exercise.

## 2017-09-21 LAB — VITAMIN D 25 HYDROXY (VIT D DEFICIENCY, FRACTURES): Vit D, 25-Hydroxy: 31 ng/mL (ref 30–100)

## 2017-09-21 LAB — COMPLETE METABOLIC PANEL WITH GFR
AG RATIO: 1.6 (calc) (ref 1.0–2.5)
ALBUMIN MSPROF: 4.6 g/dL (ref 3.6–5.1)
ALT: 9 U/L (ref 6–29)
AST: 15 U/L (ref 10–35)
Alkaline phosphatase (APISO): 66 U/L (ref 33–130)
BILIRUBIN TOTAL: 1.1 mg/dL (ref 0.2–1.2)
BUN: 8 mg/dL (ref 7–25)
CHLORIDE: 104 mmol/L (ref 98–110)
CO2: 28 mmol/L (ref 20–32)
Calcium: 9.6 mg/dL (ref 8.6–10.4)
Creat: 0.67 mg/dL (ref 0.50–0.99)
GFR, EST AFRICAN AMERICAN: 105 mL/min/{1.73_m2} (ref >=60)
GFR, Est Non African American: 90 mL/min/{1.73_m2} (ref >=60)
GLOBULIN: 2.9 g/dL (ref 1.9–3.7)
Glucose, Bld: 89 mg/dL (ref 65–99)
POTASSIUM: 3.4 mmol/L — AB (ref 3.5–5.3)
SODIUM: 142 mmol/L (ref 135–146)
TOTAL PROTEIN: 7.5 g/dL (ref 6.1–8.1)

## 2017-09-21 LAB — CBC
HCT: 37.9 % (ref 35.0–45.0)
Hemoglobin: 12.6 g/dL (ref 11.7–15.5)
MCH: 28.8 pg (ref 27.0–33.0)
MCHC: 33.2 g/dL (ref 32.0–36.0)
MCV: 86.5 fL (ref 80.0–100.0)
MPV: 11.2 fL (ref 7.5–12.5)
Platelets: 174 10*3/uL (ref 140–400)
RBC: 4.38 10*6/uL (ref 3.80–5.10)
RDW: 12.2 % (ref 11.0–15.0)
WBC: 5.3 10*3/uL (ref 3.8–10.8)

## 2017-09-21 LAB — HEMOGLOBIN A1C
EAG (MMOL/L): 5.8 (calc)
Hgb A1c MFr Bld: 5.3 % of total Hgb (ref <5.7)
Mean Plasma Glucose: 105 (calc)

## 2017-09-21 LAB — LIPID PANEL W/REFLEX DIRECT LDL
Cholesterol: 196 mg/dL (ref <200)
HDL: 70 mg/dL (ref >50)
LDL Cholesterol (Calc): 103 mg/dL (calc) — ABNORMAL HIGH
Non-HDL Cholesterol (Calc): 126 mg/dL (calc) (ref <130)
Total CHOL/HDL Ratio: 2.8 (calc) (ref <5.0)
Triglycerides: 132 mg/dL (ref <150)

## 2017-09-21 LAB — VITAMIN B12: Vitamin B-12: 353 pg/mL (ref 200–1100)

## 2017-09-21 LAB — B. BURGDORFI ANTIBODIES: B burgdorferi Ab IgG+IgM: <0.9 index

## 2017-09-21 LAB — TSH: TSH: 3.04 m[IU]/L (ref 0.40–4.50)

## 2017-09-21 LAB — IRON,TIBC AND FERRITIN PANEL
%SAT: 22 % (calc) (ref 11–50)
FERRITIN: 93 ng/mL (ref 20–288)
IRON: 78 ug/dL (ref 45–160)
TIBC: 362 ug/dL (ref 250–450)

## 2017-09-26 ENCOUNTER — Encounter: Payer: Self-pay | Admitting: Family Medicine

## 2017-09-26 ENCOUNTER — Ambulatory Visit (INDEPENDENT_AMBULATORY_CARE_PROVIDER_SITE_OTHER): Payer: Medicare Other | Admitting: Family Medicine

## 2017-09-26 VITALS — BP 112/57 | HR 67

## 2017-09-26 DIAGNOSIS — R21 Rash and other nonspecific skin eruption: Secondary | ICD-10-CM

## 2017-09-26 DIAGNOSIS — L821 Other seborrheic keratosis: Secondary | ICD-10-CM

## 2017-09-26 NOTE — Progress Notes (Signed)
   Subjective:    Patient ID: Stephanie Rollins, female    DOB: October 28, 1951, 66 y.o.   MRN: [id number]  HPI 66 yo female comes in today to have 5 lesion on her neck frozen.  She did mention these during her recent Medicare wellness exam.  Today she also complains of a rash on her right antecubital fossa.  She says that after she had her blood drawn the following day she noticed a couple of erythematous red itchy bumps.  She has not put anything on it yet but wanted me to take a look at it today.  She has not changed any soaps, lotions etc.  For the lesions on her neck she has been applying vinegar and covering them with Band-Aids trying to get them off herself.  She says they catch on her clothing and get easily irritated.   Review of Systems     Objective:   Physical Exam  Constitutional: She is oriented to person, place, and time. She appears well-developed and well-nourished.  HENT:  Head: Normocephalic and atraumatic.  Eyes: Conjunctivae and EOM are normal.  Cardiovascular: Normal rate.  Pulmonary/Chest: Effort normal.  Neurological: She is alert and oriented to person, place, and time.  Skin: Skin is dry. No pallor.  She has several large approximately 8 x 10 mm seborrheic keratoses on her anterior neck.  There are 5 lesions in particular that she wants treated.  On her right arm just below the antecubital fossa on the distal forearm she has some erythematous papules but no vesicles.  No break in the skin or open wound.  Psychiatric: She has a normal mood and affect. Her behavior is normal.  Vitals reviewed.         Assessment & Plan:  Contact dermatitis in the right antecubital fossa-recommend treat with topical hydrocortisone cream over-the-counter.  Call if not improving at the end of the week.  Seborrheic keratoses x5 on her neck-recommend cryotherapy for definitive treatment.  Discussed the procedure and potential side effects.  Please see note below.  Patient actually did  really well.  Cryotherapy Procedure Note  Pre-operative Diagnosis: Seborrheic keratosis  Post-operative Diagnosis: same  Locations: anterior neck    Indications: irritation, she says they frequently catch on her clothing and rub.  Anesthesia: not required    Procedure Details  Patient informed of risks (permanent scarring, infection, light or dark discoloration, bleeding, infection, weakness, numbness and recurrence of the lesion) and benefits of the procedure and verbal informed consent obtained.  The areas are treated with liquid nitrogen therapy, frozen until ice ball extended 1-2 mm beyond lesion, allowed to thaw, and treated again. The patient tolerated procedure well.  The patient was instructed on post-op care, warned that there may be blister formation, redness and pain. Recommend OTC analgesia as needed for pain.  Condition: Stable  Complications: none.  Plan: 1. Instructed to keep the area dry and covered for 24-48h and clean thereafter. 2. Warning signs of infection were reviewed.   3. Recommended that the patient use OTC acetaminophen as needed for pain.  4. Return PRN.

## 2017-09-26 NOTE — Patient Instructions (Addendum)
Cryosurgery for Skin Conditions Cryosurgery, also called cryotherapy, is the use of extreme cold to freeze and remove abnormal or diseased tissue. Growths on the skin such as warts, precancerous skin lesions (actinic keratoses), and some skin cancers may be removed with cryosurgery. Cryosurgery usually takes a few minutes, and it can be done in your health care provider's office. Tell a health care provider about:  Any allergies you have.  All medicines you are taking, including vitamins, herbs, eye drops, creams, and over-the-counter medicines.  Any problems you or family members have had with anesthetic medicines.  Any blood disorders you have.  Any surgeries you have had.  Any medical conditions you have. What are the risks? Generally, this is a safe procedure. However, problems may occur, including:  Bleeding.  Scarring.  Changes in skin color (lighter or darker than normal skin tone).  Swelling.  Hair loss in the treated area.  Nerve damage and loss of feeling (rare).  What happens before the procedure? No specific preparation is necessary for this procedure. What happens during the procedure?  Your procedure will be performed using one of the following methods: ? Your health care provider may apply a device (probe) to the skin. The probe has liquid nitrogen flowing through it to cool it down. The probe will be applied to the skin until the skin is frozen and destroyed. ? Your health care provider may apply liquid nitrogen to the skin with a swab or by spraying it until the skin is frozen and destroyed.  The treated area may be covered with a bandage (dressing). These procedures may vary among health care providers and clinics. What happens after the procedure?  Shortly after the procedure, the treated area will become red and swollen. A blister may form. Summary  Cryosurgery, also called cryotherapy, is the use of extreme cold to freeze and remove abnormal or  diseased tissue.  Cryosurgery usually takes a few minutes, and it can be done in your health care provider's office.  There are two different methods for performing cryosurgery. One method involves using a probe to freeze the growth, and the other method involves applying liquid nitrogen directly to the growth. This information is not intended to replace advice given to you by your health care provider. Make sure you discuss any questions you have with your health care provider. Document Released: 11/05/2000 Document Revised: 09/27/2016 Document Reviewed: 09/27/2016 Elsevier Interactive Patient Education  2017 [employer]  

## 2017-11-11 ENCOUNTER — Telehealth: Payer: Self-pay | Admitting: Osteopathic Medicine

## 2017-11-11 NOTE — Telephone Encounter (Signed)
Received fax from cologuard, sample could not be processed, empty collection was received by the lab. Paper states that they will contact the patient to initiate a new sample collection.  However, not sure why came back under my name, I have never seen this patient. Dr. Linford ArnoldMetheney is PCP. I think we have had this issue once or twice in the past as well with Cologuard, but I'm not sure? I'm CC-ing this message to her as FY - Do you recall this happening and whether any steps were taken to address it at that point?

## 2017-11-11 NOTE — Telephone Encounter (Signed)
Agree with below. companty to contact pt to try to return sample

## 2018-02-20 ENCOUNTER — Emergency Department (INDEPENDENT_AMBULATORY_CARE_PROVIDER_SITE_OTHER)
Admission: EM | Admit: 2018-02-20 | Discharge: 2018-02-20 | Disposition: A | Discharge status: Home / Self Care | Payer: Medicare Other | Source: Home / Self Care | Attending: Family Medicine | Admitting: Family Medicine

## 2018-02-20 ENCOUNTER — Encounter: Payer: Self-pay | Admitting: *Deleted

## 2018-02-20 ENCOUNTER — Telehealth: Payer: Self-pay | Admitting: Cardiology

## 2018-02-20 ENCOUNTER — Other Ambulatory Visit: Payer: Self-pay

## 2018-02-20 DIAGNOSIS — R002 Palpitations: Secondary | ICD-10-CM | POA: Diagnosis not present

## 2018-02-20 DIAGNOSIS — K219 Gastro-esophageal reflux disease without esophagitis: Secondary | ICD-10-CM

## 2018-02-20 MED ORDER — OMEPRAZOLE 20 MG PO CPDR: DELAYED_RELEASE_CAPSULE | ORAL | 1 refills | Status: DC

## 2018-02-20 NOTE — Telephone Encounter (Signed)
Duplicate message, see other telephone note. 

## 2018-02-20 NOTE — Discharge Instructions (Addendum)
If symptoms become significantly worse during the night or over the weekend, proceed to the local emergency room.  

## 2018-02-20 NOTE — Telephone Encounter (Signed)
Patient c/o Palpitations:  High priority if patient c/o lightheadedness, shortness of breath, or chest pain  1) How long have you had palpitations/irregular HR/ Afib? Are you having the symptoms now? Yes, starting 5 am on Sunday   2) Are you currently experiencing lightheadedness, SOB or CP? No  3) Do you have a history of afib (atrial fibrillation) or irregular heart rhythm? Happened a few years ago   4) Have you checked your BP or HR? (document readings if available): 6am 144/66 hr 80   8:32am 151/62 HR 97  5) Are you experiencing any other symptoms? No

## 2018-02-20 NOTE — Telephone Encounter (Signed)
Note    Patient c/o Palpitations:  High priority if patient c/o lightheadedness, shortness of breath, or chest pain  1. How long have you had palpitations/irregular HR/ Afib? Are you having the symptoms now? Yes, starting 5 am on Sunday   2. Are you currently experiencing lightheadedness, SOB or CP? No  3. Do you have a history of afib (atrial fibrillation) or irregular heart rhythm? Happened a few years ago   4. Have you checked your BP or HR? (document readings if available): 6am 144/66 hr 80   8:32am 151/62 HR 97  5. Are you experiencing any other symptoms? No

## 2018-02-20 NOTE — ED Provider Notes (Signed)
[facility]    CSN: [id number] Arrival date & time: 02/20/18  1344     History   Chief Complaint Chief Complaint  Patient presents with  . Irregular Heart Beat    HPI Stephanie Rollins is a 67 y.o. female.   About 5am yesterday patient experienced a vague brief "fluttering" sensation in her anterior chest that she has not experienced before.  This morning at about 5am she again experienced a similar sensation, without associated chest pain or shortness of breath.  She checked her pulse at 6:08am, noting it to be 144.  At 6:28am her pulse was 88, and at 8:30am it was 97.  She states that her pulse normally ranges from 60 to 70.  She is assymptomatic at present. She has a past history of paroxysmal A-fib, s/p ablation.  Her cardiac symptoms have been controlled with metoprolol 25mg  BID. Patient also has a history of acid reflux, although she does not take medication for this.  She admits that she often eats a late dinner, and yesterday ate at 10pm.  The history is provided by the patient.  Palpitations  Palpitations quality:  Regular Onset quality:  Sudden Duration:  1 minute Timing:  Sporadic Progression:  Resolved Chronicity:  New Context comment:  Upon awakening Relieved by:  None tried Worsened by:  Nothing Ineffective treatments:  None tried Associated symptoms: no back pain, no chest pain, no chest pressure, no cough, no diaphoresis, no dizziness, no leg pain, no lower extremity edema, no malaise/fatigue, no nausea, no near-syncope, no PND, no shortness of breath and no syncope   Risk factors: hx of atrial fibrillation   Risk factors: no hx of PE and no hx of thyroid disease     Past Medical History:  Diagnosis Date  . Anal fistula   . Atrial fibrillation (HCC)   . Dyslipidemia   . Hearing loss    75%  . Hypertension   . IBS (irritable bowel syndrome)    remote history  . Interstitial cystitis   . Menopausal state   . MVP (mitral valve prolapse)   .  Paroxysmal A-fib (HCC)    status post ablation  . Swallowing difficulty    problems for second/need dentures    Patient Active Problem List   Diagnosis Date Noted  . Hearing loss 09/20/2017  . Non-traumatic compression fracture of T7 thoracic vertebra, sequela 12/21/2016  . Osteoarthritis of both hands 05/03/2016  . Osteoporosis 05/03/2016  . Mitral valve prolapse 08/07/2014  . PREMATURE VENTRICULAR CONTRACTIONS 10/07/2010  . GERD 11/13/2008  . OSTEOPOROSIS 10/15/2008  . INSULIN RESISTANCE SYNDROME 09/25/2008  . ECZEMA 12/29/2007  . CYSTITIS, CHRONIC INTERSTITIAL 11/17/2006  . KNEE PAIN, BILATERAL 10/24/2006  . HYPERCHOLESTEROLEMIA 08/30/2006  . ANXIETY 08/30/2006  . ATRIAL FIBRILLATION 08/30/2006  . RHINITIS, ALLERGIC 08/30/2006    Past Surgical History:  Procedure Laterality Date  . CARDIAC ELECTROPHYSIOLOGY MAPPING AND ABLATION     for A fib  . CESAREAN SECTION  1971, 1973   x 2  . NASAL SEPTUM SURGERY  1977  . pulmonary vein isolation  06-2006   procedure for AFIB /[facility]    OB History   None      Home Medications    Prior to Admission medications   Medication Sig Start Date End Date Taking? Authorizing Provider  aspirin 81 MG chewable tablet Chew 81 mg by mouth daily.     [provider]  CALCIUM PO Take 1 tablet by mouth daily.  [provider]  Cyanocobalamin (VITAMIN B 12 PO) Take 1,000 mcg by mouth.    [provider]  metoprolol tartrate (LOPRESSOR) 25 MG tablet Take 1 tablet (25 mg total) by mouth 2 (two) times daily. 06/22/17   Lewayne Buntingrenshaw, Brian S, MD  Multiple Vitamin (MULTIVITAMIN) capsule Take by mouth.    [provider]  omeprazole (PRILOSEC) 20 MG capsule Take one cap daily, 20 to 30 minutes before a meal. 02/20/18   Cathren HarshBeese, Tera MaterStephen A, MD    Family History Family History  Problem Relation Age of Onset  . Hypertension Mother   . Diabetes Maternal Grandmother   . Heart disease Brother   . Stroke  Paternal Aunt   . Heart attack Father   . Heart disease Sister        x 2    Social History Social History   Tobacco Use  . Smoking status: Never Smoker  . Smokeless tobacco: Never Used  Substance Use Topics  . Alcohol use: No  . Drug use: No     Allergies   Amoxicillin; Ampicillin; Azithromycin; Cephalexin; Clarithromycin; Codeine; Flecainide; Penicillins; Shellfish allergy; and Shellfish-derived products   Review of Systems Review of Systems  Constitutional: Negative for activity change, chills, diaphoresis, fatigue, fever and malaise/fatigue.  HENT: Negative.   Eyes: Negative.   Respiratory: Negative for cough, chest tightness, shortness of breath and wheezing.   Cardiovascular: Positive for palpitations. Negative for chest pain, leg swelling, syncope, PND and near-syncope.  Gastrointestinal: Negative for nausea.  Genitourinary: Negative.   Musculoskeletal: Negative for back pain.  Skin: Negative.   Neurological: Negative for dizziness, syncope and light-headedness.  Psychiatric/Behavioral: The patient is not nervous/anxious.      Physical Exam Triage Vital Signs ED Triage Vitals  Enc Vitals Group     BP 02/20/18 1405 (!) 142/77     Pulse Rate 02/20/18 1405 74     Resp 02/20/18 1405 16     Temp 02/20/18 1405 97.9 F (36.6 C)     Temp Source 02/20/18 1405 Oral     SpO2 02/20/18 1405 99 %     Weight 02/20/18 1406 115 lb (52.2 kg)     Height 02/20/18 1406 5\' 4"  (1.626 m)     Head Circumference --      Peak Flow --      Pain Score 02/20/18 1406 0     Pain Loc --      Pain Edu? --      Excl. in GC? --    No data found.  Updated Vital Signs BP (!) 142/77 (BP Location: Right Arm)   Pulse 74   Temp 97.9 F (36.6 C) (Oral)   Resp 16   Ht 5\' 4"  (1.626 m)   Wt 115 lb (52.2 kg)   SpO2 99%   BMI 19.74 kg/m   Visual Acuity Right Eye Distance:   Left Eye Distance:   Bilateral Distance:    Right Eye Near:   Left Eye Near:    Bilateral Near:      Physical Exam Nursing notes and Vital Signs reviewed. Appearance:  Patient appears stated age, and in no acute distress.  She is alert and oriented.   Eyes:  Pupils are equal, round, and reactive to light and accomodation.  Extraocular movement is intact.  Conjunctivae are not inflamed   Pharynx:  Normal; moist mucous membranes  Neck:  Supple.  No thyromegaly or adenopathy Lungs:  Clear to auscultation.  Breath sounds are equal.  Moving air well. Heart:  Regular rate and rhythm without murmurs, rubs, or gallops.  Rate 80 Abdomen:  Nontender without masses or hepatosplenomegaly.  Bowel sounds are present.  No CVA or flank tenderness.  Extremities:  No edema.  No lower leg tenderness to palpation. Skin:  No rash present.     UC Treatments / Results  Labs (all labs ordered are listed, but only abnormal results are displayed) Labs Reviewed - No data to display  EKG  Rate:  70 BPM PR:  122 msec QT:  422 msec QTcH:  455 msec QRSD:  84 msec QRS axis:  41 degrees Interpretation:  Normal sinus rhythm; no acute changes.  Chart reviewed:  Today's EKG similar to EKG performed 03/17/16   Radiology No results found.  Procedures Procedures (including critical care time)  Medications Ordered in UC Medications - No data to display   Initial Impression / Assessment and Plan / UC Course  I have reviewed the triage vital signs and the nursing notes.  Pertinent labs & imaging results that were available during my care of the patient were reviewed by me and considered in my medical decision making (see chart for details).    EKG shows NSR at rate 70.  Rate during exam 80.   Patient has history of GERD that appears to be worsening.  Her early morning palpitations may be precipitated by GERD.  Will begin trial of omeprazole 20mg  daily. Recommend that she follow-up with her cardiologist for event monitor. Recommend that she follow-up with PCP in about 7 to 10 days for her GERD. If  symptoms become significantly worse during the night or over the weekend, proceed to the local emergency room.     Final Clinical Impressions(s) / UC Diagnoses   Final diagnoses:  Palpitations  Gastroesophageal reflux disease, esophagitis presence not specified    ED Discharge Orders        Ordered    omeprazole (PRILOSEC) 20 MG capsule     02/20/18 1528          Lattie Haw, MD 02/27/18 1332

## 2018-02-20 NOTE — ED Triage Notes (Signed)
Pt c/o having a "weird" feeling in her chest, irregular HR and fluttering feeling x 2 days. She called Dr Jens Somrenshaw Cardiology and he reccommended she be seen for an EKG. Denies CP.

## 2018-02-20 NOTE — Telephone Encounter (Signed)
Returned call to patient, patient reports she has been doing well x 3 years, starting Sunday AM she woke up at [facility] with chest tightness and palpitations, this occurred for 1 hour, then resolved and she was able to go back to sleep.  This AM, woke up at 5 am again with palpitations/ "weird feeling" in chest, was not able to go back to sleep.  She got up to take BP and HR and it was 144/66 HR 80.   Still having chest tightness and palpitations.   Took her AM medications and rechecked BP and HR at 1018 BP 127/62 HR 75.     States she does not want to go to ER but she is going to go to Urgent Care to get checked out.   Denies SOB, dizziness, lightheadedness.   Agreed with going to urgent care to be evaluated if not going to go to ER.    Also advised we need to get appt scheduled as it has been ~ 2 years since being seen.   Offered first available with APP in [place], patient states she is not able to come to [place] office, app[place] need to be in [place].  Advised Dr. Jens Somrenshaw is not there often but patient states she can only go to this office.     Patient is going to UC in [place].  Advised I would route to scheduling to assist with getting her appointment in [place].

## 2018-03-27 NOTE — Progress Notes (Signed)
HPI: fu SVT as well as atrial fibrillation and mitral valve prolapse. Cardiac catheterization in September of 2002 showed normal coronary arteries and normal LV function with an ejection fraction of 65%. Myoview in 2007 showed EF 70 and normal perfusion. She has had a previous ablation of her atrial fibrillation in August 2007 at [facility]. She has also had recurrent palpitations secondary to PVCs treated with beta blockade. Event monitor 12/14 showed sinus with brief PAT. Echocardiogram September 2015 showed normal LV function, trace mitral and tricuspid regurgitation.  Patient seen in the emergency room on April 1 with palpitations.  She felt her heart rate was elevated.  Electrocardiogram showed sinus rhythm.  Since I last saw her she occasionally has palpitations.  On the day she went to the emergency room she states her heart rate went into the 70s and 80s.  She has mild dyspnea on exertion but no orthopnea, PND, pedal edema, exertional chest pain or syncope.  Current Outpatient Medications  Medication Sig Dispense Refill  . aspirin 81 MG chewable tablet Chew 81 mg by mouth daily.     Marland Kitchen CALCIUM PO Take 1 tablet by mouth daily.    . Cyanocobalamin (VITAMIN B 12 PO) Take 1,000 mcg by mouth.    . metoprolol tartrate (LOPRESSOR) 25 MG tablet Take 1 tablet (25 mg total) by mouth 2 (two) times daily. 180 tablet 3  . Multiple Vitamin (MULTIVITAMIN) capsule Take by mouth.     No current facility-administered medications for this visit.      Past Medical History:  Diagnosis Date  . Anal fistula   . Atrial fibrillation (HCC)   . Dyslipidemia   . Hearing loss    75%  . Hypertension   . IBS (irritable bowel syndrome)    remote history  . Interstitial cystitis   . Menopausal state   . MVP (mitral valve prolapse)   . Paroxysmal A-fib (HCC)    status post ablation  . Swallowing difficulty    problems for second/need dentures    Past Surgical History:  Procedure Laterality  Date  . CARDIAC ELECTROPHYSIOLOGY MAPPING AND ABLATION     for A fib  . CESAREAN SECTION  1971, 1973   x 2  . NASAL SEPTUM SURGERY  1977  . pulmonary vein isolation  06-2006   procedure for AFIB /[facility]    Social History   Socioeconomic History  . Marital status: Divorced    Spouse name: Not on file  . Number of children: 2  . Years of education: Not on file  . Highest education level: Not on file  Occupational History  . Occupation: retired  Engineer, production  . Financial resource strain: Not on file  . Food insecurity:    Worry: Not on file    Inability: Not on file  . Transportation needs:    Medical: Not on file    Non-medical: Not on file  Tobacco Use  . Smoking status: Never Smoker  . Smokeless tobacco: Never Used  Substance and Sexual Activity  . Alcohol use: No  . Drug use: No  . Sexual activity: Not on file    Comment: unemployed, GED, divorced,2 adult children, no caffeine, no regular exercise  Lifestyle  . Physical activity:    Days per week: Not on file    Minutes per session: Not on file  . Stress: Not on file  Relationships  . Social connections:    Talks on phone: Not on  file    Gets together: Not on file    Attends religious service: Not on file    Active member of club or organization: Not on file    Attends meetings of clubs or organizations: Not on file    Relationship status: Not on file  . Intimate partner violence:    Fear of current or ex partner: Not on file    Emotionally abused: Not on file    Physically abused: Not on file    Forced sexual activity: Not on file  Other Topics Concern  . Not on file  Social History Narrative  . Not on file    Family History  Problem Relation Age of Onset  . Hypertension Mother   . Diabetes Maternal Grandmother   . Heart disease Brother   . Stroke Paternal Aunt   . Heart attack Father   . Heart disease Sister        x 2    ROS: no fevers or chills, productive cough, hemoptysis,  dysphasia, odynophagia, melena, hematochezia, dysuria, hematuria, rash, seizure activity, orthopnea, PND, pedal edema, claudication. Remaining systems are negative.  Physical Exam: Well-developed well-nourished in no acute distress.  Skin is warm and dry.  HEENT is normal.  Neck is supple.  Chest is clear to auscultation with normal expansion.  Cardiovascular exam is regular rate and rhythm.  Abdominal exam nontender or distended. No masses palpated. Extremities show no edema. neuro grossly intact  ECG-February 20, 2018-sinus rhythm with nonspecific ST changes.  Personally reviewed  A/P  1 history of atrial fibrillation status post ablation-there is been no evidence of recurrence on follow-up electrocardiograms.  Continue beta-blocker and aspirin.  2 palpitations-continue beta-blocker.  She does have occasional palpitations which is chronic.  If her symptoms worsen we will plan an event monitor to make sure she is not having recurrent atrial fibrillation.  This has not been documented since her ablation.  3 history of mitral valve prolapse-trace mitral regurgitation on most recent echocardiogram.  4 history of chest pain-no symptoms recently.  Olga Millers, MD

## 2018-03-29 ENCOUNTER — Ambulatory Visit (INDEPENDENT_AMBULATORY_CARE_PROVIDER_SITE_OTHER): Payer: Medicare Other | Admitting: Cardiology

## 2018-03-29 ENCOUNTER — Encounter: Payer: Self-pay | Admitting: Cardiology

## 2018-03-29 VITALS — BP 110/69 | HR 66 | Ht 64.0 in | Wt 118.1 lb

## 2018-03-29 DIAGNOSIS — I480 Paroxysmal atrial fibrillation: Secondary | ICD-10-CM | POA: Diagnosis not present

## 2018-03-29 DIAGNOSIS — R002 Palpitations: Secondary | ICD-10-CM | POA: Diagnosis not present

## 2018-03-29 DIAGNOSIS — I341 Nonrheumatic mitral (valve) prolapse: Secondary | ICD-10-CM | POA: Diagnosis not present

## 2018-03-29 NOTE — Patient Instructions (Signed)
Your physician wants you to follow-up in: ONE YEAR WITH DR CRENSHAW You will receive a reminder letter in the mail two months in advance. If you don't receive a letter, please call our office to schedule the follow-up appointment.   If you need a refill on your cardiac medications before your next appointment, please call your pharmacy.  

## 2018-05-09 ENCOUNTER — Telehealth: Payer: Self-pay

## 2018-05-09 NOTE — Telephone Encounter (Signed)
Pt called stating that she was hit on the head on Sunday when a metal broom fell out of a closet.   Pt complains of headache, bruising, and swelling on the scalp. No nausea or vomiting.   Pt's med list states she takes ASA QD and she confirms this. I advised pt to have someone drive her to ER to be evaluated.  Pt agreeable and states she was worried with her non-stop HA and the bruising/swelling. On her way to ER as soon as we get off phone.   FYI note to Dr Linford ArnoldMetheney

## 2018-06-12 DIAGNOSIS — H43393 Other vitreous opacities, bilateral: Secondary | ICD-10-CM | POA: Diagnosis not present

## 2018-06-12 DIAGNOSIS — H04123 Dry eye syndrome of bilateral lacrimal glands: Secondary | ICD-10-CM | POA: Diagnosis not present

## 2018-06-12 DIAGNOSIS — H527 Unspecified disorder of refraction: Secondary | ICD-10-CM | POA: Diagnosis not present

## 2018-06-12 DIAGNOSIS — H2513 Age-related nuclear cataract, bilateral: Secondary | ICD-10-CM | POA: Diagnosis not present

## 2018-06-29 ENCOUNTER — Encounter: Payer: Self-pay | Admitting: Family Medicine

## 2018-06-29 ENCOUNTER — Ambulatory Visit (INDEPENDENT_AMBULATORY_CARE_PROVIDER_SITE_OTHER): Payer: Medicare Other

## 2018-06-29 ENCOUNTER — Ambulatory Visit (INDEPENDENT_AMBULATORY_CARE_PROVIDER_SITE_OTHER): Payer: Medicare Other | Admitting: Family Medicine

## 2018-06-29 ENCOUNTER — Ambulatory Visit: Payer: Medicare Other | Admitting: Osteopathic Medicine

## 2018-06-29 VITALS — BP 125/65 | HR 63 | Ht 64.0 in | Wt 120.0 lb

## 2018-06-29 DIAGNOSIS — M533 Sacrococcygeal disorders, not elsewhere classified: Secondary | ICD-10-CM

## 2018-06-29 DIAGNOSIS — K649 Unspecified hemorrhoids: Secondary | ICD-10-CM

## 2018-06-29 DIAGNOSIS — K6289 Other specified diseases of anus and rectum: Secondary | ICD-10-CM | POA: Diagnosis not present

## 2018-06-29 LAB — POC HEMOCCULT BLD/STL (OFFICE/1-CARD/DIAGNOSTIC): Fecal Occult Blood, POC: NEGATIVE

## 2018-06-29 MED ORDER — LIDOCAINE (ANORECTAL) 5 % EX CREA: 1.0000 "application " | TOPICAL_CREAM | Freq: Two times a day (BID) | CUTANEOUS | 1 refills | Status: DC | PRN

## 2018-06-29 MED ORDER — HYDROCORTISONE 1 % RE CREA: 1.0000 "application " | TOPICAL_CREAM | Freq: Two times a day (BID) | RECTAL | 1 refills | Status: DC | PRN

## 2018-06-29 NOTE — Progress Notes (Signed)
Subjective:    Patient ID: Stephanie Rollins, female    DOB: 1951-04-21, 67 y.o.   MRN: [id number]  HPI 67 yo here today for low back pain that she says is midline and feels like it is deeper inside.  He points to the sacral area over the midline where her pain is mostly located.  She says when she starts to get the sharp rectal pain that shoots up to the sacral area and then radiates outward bilaterally.  She denies any recent injury or trauma.  She has been using heat.  Describes also a sharp in her rectum.  Last BM was this AM. Hx of hemorrhoids.  No blood in the stools.  She says the hemorrhoids will actually protrude at times.  She says she will usually take a tissue and try to push them back again sometimes she can get them back and it sometimes she cannot.  She has had hemorrhoids since she had a C-section after giving birth to her son.  She has had prior history of anal fissures before but has not noticed any significant blood.   Review of Systems  BP 125/65   Pulse 63   Ht 5\' 4"  (1.626 m)   Wt 120 lb (54.4 kg)   SpO2 98%   BMI 20.60 kg/m     Allergies  Allergen Reactions  . Amoxicillin Rash and Other (See Comments)  . Ampicillin   . Azithromycin     REACTION: SOB  . Cephalexin   . Clarithromycin   . Codeine   . Flecainide   . Penicillins     REACTION: rash  . Shellfish Allergy     Sick to stomach   . Shellfish-Derived Products Hives and Nausea And Vomiting    Past Medical History:  Diagnosis Date  . Anal fistula   . Atrial fibrillation (HCC)   . Dyslipidemia   . Hearing loss    75%  . Hypertension   . IBS (irritable bowel syndrome)    remote history  . Interstitial cystitis   . Menopausal state   . MVP (mitral valve prolapse)   . Paroxysmal A-fib (HCC)    status post ablation  . Swallowing difficulty    problems for second/need dentures    Past Surgical History:  Procedure Laterality Date  . CARDIAC ELECTROPHYSIOLOGY MAPPING AND ABLATION     for A  fib  . CESAREAN SECTION  1971, 1973   x 2  . NASAL SEPTUM SURGERY  1977  . pulmonary vein isolation  06-2006   procedure for AFIB /[facility]    Social History   Socioeconomic History  . Marital status: Divorced    Spouse name: Not on file  . Number of children: 2  . Years of education: Not on file  . Highest education level: Not on file  Occupational History  . Occupation: retired  Engineer, productionocial Needs  . Financial resource strain: Not on file  . Food insecurity:    Worry: Not on file    Inability: Not on file  . Transportation needs:    Medical: Not on file    Non-medical: Not on file  Tobacco Use  . Smoking status: Never Smoker  . Smokeless tobacco: Never Used  Substance and Sexual Activity  . Alcohol use: No  . Drug use: No  . Sexual activity: Not on file    Comment: unemployed, GED, divorced,2 adult children, no caffeine, no regular exercise  Lifestyle  . Physical activity:  Days per week: Not on file    Minutes per session: Not on file  . Stress: Not on file  Relationships  . Social connections:    Talks on phone: Not on file    Gets together: Not on file    Attends religious service: Not on file    Active member of club or organization: Not on file    Attends meetings of clubs or organizations: Not on file    Relationship status: Not on file  . Intimate partner violence:    Fear of current or ex partner: Not on file    Emotionally abused: Not on file    Physically abused: Not on file    Forced sexual activity: Not on file  Other Topics Concern  . Not on file  Social History Narrative  . Not on file    Family History  Problem Relation Age of Onset  . Hypertension Mother   . Diabetes Maternal Grandmother   . Heart disease Brother   . Stroke Paternal Aunt   . Heart attack Father   . Heart disease Sister        x 2    Outpatient Encounter Medications as of 06/29/2018  Medication Sig  . aspirin 81 MG chewable tablet Chew 81 mg by mouth daily.   Marland Kitchen  CALCIUM PO Take 1 tablet by mouth daily.  . Cyanocobalamin (VITAMIN B 12 PO) Take 1,000 mcg by mouth.  . metoprolol tartrate (LOPRESSOR) 25 MG tablet Take 1 tablet (25 mg total) by mouth 2 (two) times daily.  . Multiple Vitamin (MULTIVITAMIN) capsule Take by mouth.  . hydrocortisone (PROCTO-PAK) 1 % CREA Apply 1 application topically 2 (two) times daily as needed.  . Lidocaine, Anorectal, 5 % CREA Apply 1 application topically 2 (two) times daily as needed. Ok to sub 2%if cheaper   No facility-administered encounter medications on file as of 06/29/2018.          Objective:   Physical Exam  Constitutional: She is oriented to person, place, and time. She appears well-developed and well-nourished.  HENT:  Head: Normocephalic and atraumatic.  Eyes: Conjunctivae and EOM are normal.  Cardiovascular: Normal rate.  Pulmonary/Chest: Effort normal.  Genitourinary: Rectal exam shows external hemorrhoid and internal hemorrhoid. Rectal exam shows no fissure, no mass, no tenderness, anal tone normal and guaiac negative stool.  Musculoskeletal:  Lumbar spine with normal flexion extension rotation and side bending.  She had more pain with full flexion.  Neurological: She is alert and oriented to person, place, and time.  Skin: Skin is dry. No pallor.  Psychiatric: She has a normal mood and affect. Her behavior is normal.  Vitals reviewed.       Assessment & Plan:  Low back pain  -is unclear if the rectal pain is causing the back pain or if it has 2 separate issues going on.  We will get x-rays of the sacrum.  She was told at one point that she had some inflammatory arthritis in her low back.  Rectal pain with hemorrhoids -she certainly does have some external and internal hemorrhoids of the external hemorrhoids are not inflamed currently.  We will try topical rectal cream with steroid and numbing medication to try to get her some temporary relief.  I am also can refer her to GI for further  evaluation.  Her last colonoscopy was greater than 10 years ago in fact it was April 26 of 2007.

## 2018-06-29 NOTE — Patient Instructions (Signed)
Hemorrhoids    Hemorrhoids are swollen veins in and around the rectum or anus. Hemorrhoids can cause pain, itching, or bleeding. Most of the time, they do not cause serious problems. They usually get better with diet changes, lifestyle changes, and other home treatments.  Follow these instructions at home:  Eating and drinking  · Eat foods that have fiber, such as whole grains, beans, nuts, fruits, and vegetables. Ask your doctor about taking products that have added fiber (fiber supplements).  · Drink enough fluid to keep your pee (urine) clear or pale yellow.  For Pain and Swelling  · Take a warm-water bath (sitz bath) for 20 minutes to ease pain. Do this 3-4 times a day.  · If directed, put ice on the painful area. It may be helpful to use ice between your warm baths.  ¨ Put ice in a plastic bag.  ¨ Place a towel between your skin and the bag.  ¨ Leave the ice on for 20 minutes, 2-3 times a day.  General instructions  · Take over-the-counter and prescription medicines only as told by your doctor.  ¨ Medicated creams and medicines that are inserted into the anus (suppositories) may be used or applied as told.  · Exercise often.  · Go to the bathroom when you have the urge to poop (to have a bowel movement). Do not wait.  · Avoid pushing too hard (straining) when you poop.  · Keep the butt area dry and clean. Use wet toilet paper or moist paper towels.  · Do not sit on the toilet for a long time.  Contact a doctor if:  · You have any of these:  ¨ Pain and swelling that do not get better with treatment or medicine.  ¨ Bleeding that will not stop.  ¨ Trouble pooping or you cannot poop.  ¨ Pain or swelling outside the area of the hemorrhoids.  This information is not intended to replace advice given to you by your health care provider. Make sure you discuss any questions you have with your health care provider.  Document Released: 08/17/2008 Document Revised: 04/15/2016 Document Reviewed: 07/23/2015  Elsevier  Interactive Patient Education © 2018 [employer].   

## 2018-06-30 ENCOUNTER — Encounter: Payer: Self-pay | Admitting: Family Medicine

## 2018-07-10 ENCOUNTER — Other Ambulatory Visit: Payer: Self-pay | Admitting: Cardiology

## 2018-07-10 DIAGNOSIS — I480 Paroxysmal atrial fibrillation: Secondary | ICD-10-CM

## 2018-07-10 MED ORDER — METOPROLOL TARTRATE 25 MG PO TABS: 25.0000 mg | ORAL_TABLET | Freq: Two times a day (BID) | ORAL | 3 refills | Status: DC

## 2018-07-10 NOTE — Telephone Encounter (Signed)
Rx(s) sent to pharmacy electronically.  

## 2018-07-10 NOTE — Telephone Encounter (Signed)
New Message     *STAT* If patient is at the pharmacy, call can be transferred to refill team.   1. Which medications need to be refilled? (please list name of each medication and dose if known) metoprolol tartrate (LOPRESSOR) 25 MG tablet  2. Which pharmacy/location (including street and city if local pharmacy) is medication to be sent to? [id number] Lorenza Evangelist- [place], [place] - [address] [facility] [address]  3. Do they need a 30 day or 90 day supply? 90

## 2018-07-11 ENCOUNTER — Other Ambulatory Visit: Payer: Self-pay

## 2018-07-26 ENCOUNTER — Ambulatory Visit (INDEPENDENT_AMBULATORY_CARE_PROVIDER_SITE_OTHER): Payer: Medicare Other

## 2018-07-26 ENCOUNTER — Encounter: Payer: Self-pay | Admitting: Physician Assistant

## 2018-07-26 ENCOUNTER — Ambulatory Visit (INDEPENDENT_AMBULATORY_CARE_PROVIDER_SITE_OTHER): Payer: Medicare Other | Admitting: Physician Assistant

## 2018-07-26 VITALS — BP 117/72 | HR 82 | Resp 14 | Wt 119.0 lb

## 2018-07-26 DIAGNOSIS — R0781 Pleurodynia: Secondary | ICD-10-CM | POA: Diagnosis not present

## 2018-07-26 DIAGNOSIS — S299XXA Unspecified injury of thorax, initial encounter: Secondary | ICD-10-CM | POA: Diagnosis not present

## 2018-07-26 DIAGNOSIS — M810 Age-related osteoporosis without current pathological fracture: Secondary | ICD-10-CM

## 2018-07-26 DIAGNOSIS — J449 Chronic obstructive pulmonary disease, unspecified: Secondary | ICD-10-CM | POA: Diagnosis not present

## 2018-07-26 MED ORDER — NAPROXEN 375 MG PO TABS: 375.0000 mg | ORAL_TABLET | Freq: Two times a day (BID) | ORAL | 0 refills | Status: DC | PRN

## 2018-07-26 NOTE — Patient Instructions (Addendum)
-   x-ray today to assess for a fracture - start Naproxen 1 tablet twice a day as needed for pain/swelling - continue ice x 20 minutes every 1-2 hours - hold a pillow to your chest when you have to cough/sneeze - consider researching Envenity - a new, safe treatment for osteoporosis that can actually heal your bones as well  Chest Wall Pain Chest wall pain is pain in or around the bones and muscles of your chest. Sometimes, an injury causes this pain. Sometimes, the cause may not be known. This pain may take several weeks or longer to get better. Follow these instructions at home: Pay attention to any changes in your symptoms. Take these actions to help with your pain:  Rest as told by your health care provider.  Avoid activities that cause pain. These include any activities that use your chest muscles or your abdominal and side muscles to lift heavy items.  If directed, apply ice to the painful area: ? Put ice in a plastic bag. ? Place a towel between your skin and the bag. ? Leave the ice on for 20 minutes, 2-3 times per day.  Take over-the-counter and prescription medicines only as told by your health care provider.  Do not use tobacco products, including cigarettes, chewing tobacco, and e-cigarettes. If you need help quitting, ask your health care provider.  Keep all follow-up visits as told by your health care provider. This is important.  Contact a health care provider if:  You have a fever.  Your chest pain becomes worse.  You have new symptoms. Get help right away if:  You have nausea or vomiting.  You feel sweaty or light-headed.  You have a cough with phlegm (sputum) or you cough up blood.  You develop shortness of breath. This information is not intended to replace advice given to you by your health care provider. Make sure you discuss any questions you have with your health care provider. Document Released: 11/08/2005 Document Revised: 03/18/2016 Document  Reviewed: 02/03/2015 Elsevier Interactive Patient Education  [employer].

## 2018-07-26 NOTE — Progress Notes (Signed)
HPI:                                                                Stephanie Rollins is a 67 y.o. female who presents to [facility] Stephanie Rollins: Primary Care Sports Medicine today for left-sided rib pain  Pleasant 67 yo F with PMH of osteoporosis, hx of rib fracture, hx of non-traumatic compression fracture of T7, OA of multiple joints presents with 3 days of atraumatic left-sided rib pain. States she was bending to pick something up off of the floor and felt a popping sensation in her left rib area followed by pain. Since then she describes pressure in her anterior lower ribs radiating to her left flank and back. Occasionally pain is sharp. Worse with deep inspiration, coughing/sneezing. Associated with flank swelling. Denies chest pain or dyspnea.     No flowsheet data found.    Past Medical History:  Diagnosis Date  . Anal fistula   . Atrial fibrillation (HCC)   . Dyslipidemia   . Hearing loss    75%  . Hypertension   . IBS (irritable bowel syndrome)    remote history  . Interstitial cystitis   . Menopausal state   . MVP (mitral valve prolapse)   . Paroxysmal A-fib (HCC)    status post ablation  . Swallowing difficulty    problems for second/need dentures   Past Surgical History:  Procedure Laterality Date  . CARDIAC ELECTROPHYSIOLOGY MAPPING AND ABLATION     for A fib  . CESAREAN SECTION  1971, 1973   x 2  . NASAL SEPTUM SURGERY  1977  . pulmonary vein isolation  06-2006   procedure for AFIB /[facility]   Social History   Tobacco Use  . Smoking status: Never Smoker  . Smokeless tobacco: Never Used  Substance Use Topics  . Alcohol use: No   family history includes Diabetes in her maternal grandmother; Heart attack in her father; Heart disease in her brother and sister; Hypertension in her mother; Stroke in her paternal aunt.    ROS: negative except as noted in the HPI  Medications: Current Outpatient Medications  Medication Sig Dispense  Refill  . aspirin 81 MG chewable tablet Chew 81 mg by mouth daily.     Marland Kitchen CALCIUM PO Take 1 tablet by mouth daily.    . Cyanocobalamin (VITAMIN B 12 PO) Take 1,000 mcg by mouth.    . hydrocortisone (PROCTO-PAK) 1 % CREA Apply 1 application topically 2 (two) times daily as needed. 1 Tube 1  . Lidocaine, Anorectal, 5 % CREA Apply 1 application topically 2 (two) times daily as needed. Ok to sub 2%if cheaper 28.3 g 1  . metoprolol tartrate (LOPRESSOR) 25 MG tablet Take 1 tablet (25 mg total) by mouth 2 (two) times daily. 180 tablet 3  . Multiple Vitamin (MULTIVITAMIN) capsule Take by mouth.     No current facility-administered medications for this visit.    Allergies  Allergen Reactions  . Amoxicillin Rash and Other (See Comments)  . Ampicillin   . Azithromycin     REACTION: SOB  . Cephalexin   . Clarithromycin   . Codeine   . Flecainide   . Penicillins     REACTION: rash  . Shellfish Allergy  Sick to stomach   . Shellfish-Derived Products Hives and Nausea And Vomiting       Objective:  BP 117/72   Pulse 82   Wt 119 lb (54 kg)   BMI 20.43 kg/m  Gen:  alert, not ill-appearing, no distress, appropriate for age HEENT: head normocephalic without obvious abnormality, conjunctiva and cornea clear, trachea midline Pulm: Normal work of breathing, normal phonation, clear to auscultation bilaterally, no wheezes, rales or rhonchi CV: Normal rate, regular rhythm, s1 and s2 distinct, no murmurs, clicks or rubs  Neuro: alert and oriented x 3, no tremor MSK: extremities atraumatic, normal gait and station Ribs: no deformity or ecchymosis, diffuse tenderness of the left lateral ribs Skin: intact, no rashes on exposed skin    No results found for this or any previous visit (from the past 72 hour(s)). No results found.    Assessment and Plan: 67 y.o. female with   .Stephanie Rollins was seen today for rib injury.  Diagnoses and all orders for this visit:  Rib pain on left side -      DG Ribs Unilateral W/Chest Left -     naproxen (NAPROSYN) 375 MG tablet; Take 1 tablet (375 mg total) by mouth 2 (two) times daily as needed for moderate pain.  Osteoporosis, unspecified osteoporosis type, unspecified pathological fracture presence   - x-ray pending to assess for osteoporotic fracture - NSAID, ice, incentive spirometry at home - provided with information on Envenity for treatment of osteoporosis - cont Calcium-Vitamin D supplementation  Patient education and anticipatory guidance given Patient agrees with treatment plan Follow-up as needed if symptoms worsen or fail to improve  Levonne Hubert PA-C

## 2018-08-10 ENCOUNTER — Telehealth: Payer: Self-pay | Admitting: Family Medicine

## 2018-08-10 NOTE — Telephone Encounter (Signed)
Pt called.s  Her ribs still hurt and she wants to know if there is another test that can be done to see if anything else is going on. Thanks!

## 2018-08-10 NOTE — Telephone Encounter (Signed)
Routing to pcp.Xaivier Malay [name], CMA  

## 2018-08-11 ENCOUNTER — Encounter: Payer: Self-pay | Admitting: Family Medicine

## 2018-08-12 NOTE — Telephone Encounter (Signed)
Even though the xray was negative she could still have a hair line fracture.  Even if there is one then it will just take time to heal and there isn't a lot we can do to make it heal more quickly. I am happy to see her.

## 2018-08-14 NOTE — Telephone Encounter (Signed)
Thanks

## 2018-08-14 NOTE — Telephone Encounter (Signed)
Addressed via MyChart message

## 2018-08-29 ENCOUNTER — Ambulatory Visit (INDEPENDENT_AMBULATORY_CARE_PROVIDER_SITE_OTHER): Payer: Medicare Other | Admitting: Family Medicine

## 2018-08-29 VITALS — BP 122/62 | HR 64 | Temp 98.2°F

## 2018-08-29 DIAGNOSIS — Z23 Encounter for immunization: Secondary | ICD-10-CM | POA: Diagnosis not present

## 2018-08-29 NOTE — Progress Notes (Signed)
Stephanie Rollins is here for a high dose flu injection.  Pt tolerated injection well in the right deltoid.  Injection given by Loralee Pacas, CMA.  -EH/RMA

## 2018-09-13 NOTE — Progress Notes (Deleted)
Subjective:   Stephanie Rollins is a 67 y.o. female who presents for Medicare Annual (Subsequent) preventive examination.  Review of Systems:  No ROS.  Medicare Wellness Visit. Additional risk factors are reflected in the social history.    Sleep patterns:    Home Safety/Smoke Alarms: Feels safe in home. Smoke alarms in place.  Living environment;     Female:   Pap- aged out       Mammo-       Dexa scan-  completed      CCS-      Objective:     Vitals: There were no vitals taken for this visit.  There is no height or weight on file to calculate BMI.  No flowsheet data found.  Tobacco Social History   Tobacco Use  Smoking Status Never Smoker  Smokeless Tobacco Never Used     Counseling given: Not Answered   Clinical Intake:                       Past Medical History:  Diagnosis Date  . Anal fistula   . Atrial fibrillation (HCC)   . Dyslipidemia   . Hearing loss    75%  . Hypertension   . IBS (irritable bowel syndrome)    remote history  . Interstitial cystitis   . Menopausal state   . MVP (mitral valve prolapse)   . Paroxysmal A-fib (HCC)    status post ablation  . Swallowing difficulty    problems for second/need dentures   Past Surgical History:  Procedure Laterality Date  . CARDIAC ELECTROPHYSIOLOGY MAPPING AND ABLATION     for A fib  . CESAREAN SECTION  1971, 1973   x 2  . NASAL SEPTUM SURGERY  1977  . pulmonary vein isolation  06-2006   procedure for AFIB /[facility]   Family History  Problem Relation Age of Onset  . Hypertension Mother   . Diabetes Maternal Grandmother   . Heart disease Brother   . Stroke Paternal Aunt   . Heart attack Father   . Heart disease Sister        x 2   Social History   Socioeconomic History  . Marital status: Divorced    Spouse name: Not on file  . Number of children: 2  . Years of education: Not on file  . Highest education level: Not on file  Occupational History  .  Occupation: retired  Engineer, production  . Financial resource strain: Not on file  . Food insecurity:    Worry: Not on file    Inability: Not on file  . Transportation needs:    Medical: Not on file    Non-medical: Not on file  Tobacco Use  . Smoking status: Never Smoker  . Smokeless tobacco: Never Used  Substance and Sexual Activity  . Alcohol use: No  . Drug use: No  . Sexual activity: Not on file    Comment: unemployed, GED, divorced,2 adult children, no caffeine, no regular exercise  Lifestyle  . Physical activity:    Days per week: Not on file    Minutes per session: Not on file  . Stress: Not on file  Relationships  . Social connections:    Talks on phone: Not on file    Gets together: Not on file    Attends religious service: Not on file    Active member of club or organization: Not on file  Attends meetings of clubs or organizations: Not on file    Relationship status: Not on file  Other Topics Concern  . Not on file  Social History Narrative  . Not on file    Outpatient Encounter Medications as of 09/25/2018  Medication Sig  . aspirin 81 MG chewable tablet Chew 81 mg by mouth daily.   Marland Kitchen CALCIUM PO Take 1 tablet by mouth daily.  . Cyanocobalamin (VITAMIN B 12 PO) Take 1,000 mcg by mouth.  . hydrocortisone (PROCTO-PAK) 1 % CREA Apply 1 application topically 2 (two) times daily as needed.  . Lidocaine, Anorectal, 5 % CREA Apply 1 application topically 2 (two) times daily as needed. Ok to sub 2%if cheaper  . metoprolol tartrate (LOPRESSOR) 25 MG tablet Take 1 tablet (25 mg total) by mouth 2 (two) times daily.  . Multiple Vitamin (MULTIVITAMIN) capsule Take by mouth.  . naproxen (NAPROSYN) 375 MG tablet Take 1 tablet (375 mg total) by mouth 2 (two) times daily as needed for moderate pain.   No facility-administered encounter medications on file as of 09/25/2018.     Activities of Daily Living In your present state of health, do you have any difficulty performing the  following activities: 09/20/2017  Hearing? Y  Vision? N  Difficulty concentrating or making decisions? N  Walking or climbing stairs? N  Dressing or bathing? N  Doing errands, shopping? N  Some recent data might be hidden    Patient Care Team: Agapito Games, MD as PCP - General    Assessment:   This is a routine wellness examination for Aashritha.Physical assessment deferred to PCP.   Exercise Activities and Dietary recommendations   Diet  Breakfast: Lunch:  Dinner:       Goals   None     Fall Risk Fall Risk  09/20/2017 09/14/2016  Falls in the past year? No No   Is the patient's home free of loose throw rugs in walkways, pet beds, electrical cords, etc?   {Blank single:19197::"yes","no"}      Grab bars in the bathroom? {Blank single:19197::"yes","no"}      Handrails on the stairs?   {Blank single:19197::"yes","no"}      Adequate lighting?   {Blank single:19197::"yes","no"}   Depression Screen PHQ 2/9 Scores 09/20/2017 08/31/2017 09/14/2016  PHQ - 2 Score 0 0 0     Cognitive Function     6CIT Screen 09/20/2017 09/14/2016  What Year? 0 points 0 points  What month? 0 points 0 points  What time? 0 points 0 points  Count back from 20 0 points 0 points  Months in reverse 0 points 0 points  Repeat phrase 4 points 2 points  Total Score 4 2    Immunization History  Administered Date(s) Administered  . Influenza Split 10/18/2011, 10/03/2012  . Influenza Whole 09/22/2007, 09/06/2008, 09/24/2009, 09/23/2010  . Influenza, High Dose Seasonal PF 08/31/2017, 08/29/2018  . Influenza,inj,Quad PF,6+ Mos 08/30/2013, 08/21/2014, 09/04/2015, 09/14/2016  . Td 11/22/2000  . Tdap 03/17/2011    Screening Tests Health Maintenance  Topic Date Due  . PNA vac Low Risk Adult (1 of 2 - PCV13) 06/08/2014  . MAMMOGRAM  10/03/2014  . COLONOSCOPY  03/17/2016  . TETANUS/TDAP  03/16/2021  . INFLUENZA VACCINE  Completed  . DEXA SCAN  Completed  . Hepatitis C Screening   Completed       Plan:   ***   I have personally reviewed and noted the following in the patient's chart:   . Medical and  social history . Use of alcohol, tobacco or illicit drugs  . Current medications and supplements . Functional ability and status . Nutritional status . Physical activity . Advanced directives . List of other physicians . Hospitalizations, surgeries, and ER visits in previous 12 months . Vitals . Screenings to include cognitive, depression, and falls . Referrals and appointments  In addition, I have reviewed and discussed with patient certain preventive protocols, quality metrics, and best practice recommendations. A written personalized care plan for preventive services as well as general preventive health recommendations were provided to patient.     Normand Sloop, LPN  16/08/9603

## 2018-09-23 DIAGNOSIS — K5790 Diverticulosis of intestine, part unspecified, without perforation or abscess without bleeding: Secondary | ICD-10-CM | POA: Diagnosis not present

## 2018-09-23 DIAGNOSIS — K8020 Calculus of gallbladder without cholecystitis without obstruction: Secondary | ICD-10-CM | POA: Diagnosis not present

## 2018-09-23 DIAGNOSIS — Z91013 Allergy to seafood: Secondary | ICD-10-CM | POA: Diagnosis not present

## 2018-09-23 DIAGNOSIS — Z7982 Long term (current) use of aspirin: Secondary | ICD-10-CM | POA: Diagnosis not present

## 2018-09-23 DIAGNOSIS — R1084 Generalized abdominal pain: Secondary | ICD-10-CM | POA: Diagnosis not present

## 2018-09-23 DIAGNOSIS — Z79899 Other long term (current) drug therapy: Secondary | ICD-10-CM | POA: Diagnosis not present

## 2018-09-23 DIAGNOSIS — Z885 Allergy status to narcotic agent status: Secondary | ICD-10-CM | POA: Diagnosis not present

## 2018-09-23 DIAGNOSIS — I482 Chronic atrial fibrillation, unspecified: Secondary | ICD-10-CM | POA: Diagnosis not present

## 2018-09-23 DIAGNOSIS — Z888 Allergy status to other drugs, medicaments and biological substances status: Secondary | ICD-10-CM | POA: Diagnosis not present

## 2018-09-23 DIAGNOSIS — K5900 Constipation, unspecified: Secondary | ICD-10-CM | POA: Diagnosis not present

## 2018-09-23 DIAGNOSIS — Z880 Allergy status to penicillin: Secondary | ICD-10-CM | POA: Diagnosis not present

## 2018-09-25 ENCOUNTER — Ambulatory Visit: Payer: Medicare Other

## 2018-09-25 ENCOUNTER — Telehealth: Payer: Self-pay | Admitting: Gastroenterology

## 2018-09-25 NOTE — Telephone Encounter (Signed)
Hi Dr. Christella Hartigan, this pt would like to switch her care over to Dr. Barron Alvine because the [facility] office is closer to her house. She cannot longer come to [place]. Are you ok with the switch? Thank you.

## 2018-09-25 NOTE — Telephone Encounter (Signed)
Certainly OK with me.  thanks

## 2018-09-26 ENCOUNTER — Ambulatory Visit (INDEPENDENT_AMBULATORY_CARE_PROVIDER_SITE_OTHER): Payer: Medicare Other | Admitting: Family Medicine

## 2018-09-26 ENCOUNTER — Encounter: Payer: Self-pay | Admitting: Family Medicine

## 2018-09-26 VITALS — BP 110/74 | HR 77 | Ht 62.99 in | Wt 120.0 lb

## 2018-09-26 DIAGNOSIS — K5900 Constipation, unspecified: Secondary | ICD-10-CM

## 2018-09-26 DIAGNOSIS — K146 Glossodynia: Secondary | ICD-10-CM

## 2018-09-26 DIAGNOSIS — R682 Dry mouth, unspecified: Secondary | ICD-10-CM | POA: Diagnosis not present

## 2018-09-26 NOTE — Patient Instructions (Signed)
Continue her miralax twice a day until soft Bowel movement and then ok to decrease to once a day.

## 2018-09-26 NOTE — Telephone Encounter (Signed)
Left message to call back to schedule pt with dr. Barron Alvine for abd pressure, constipation.

## 2018-09-26 NOTE — Telephone Encounter (Signed)
No problem, happy to see her in [facility] clinic.  Thank you.

## 2018-09-26 NOTE — Progress Notes (Signed)
Subjective:    Patient ID: Stephanie Rollins, female    DOB: 1951/06/21, 67 y.o.   MRN: [id number]  HPI 67 year old female is here today for follow-up for emergency department visit.  She actually went to the emergency department at [facility] on December 2 for worsening bilateral flank and abdominal pain.  She had symptoms for about 4 days at that point and had not had a bowel movement in about 5 days.  She was still passing gas though.  CBC was normal with no elevated white blood cell count.  BUN and creatinine were normal.  She did have a CT of the abdomen and pelvis.  It just showed constipation and no bowel obstruction which is very reassuring.  There was some noted cholelithiasis as well.  Since being home she is feeling a little better.  Her last bowel movement though was 2 days ago.  She is been mostly eating applesauce, chicken broth and Jell-O.  She still experiencing some bloating.  She did have a small bowel movement on Sunday but nothing since then.  She thought she was only supposed to do the MiraLAX once but then after talking with her daughter she started doing it twice a day. Using her heat wrap on her back.     She also feels like her mouth is very dry and her taste buds are burning.  Is been going on for several weeks in fact before she went to the emergency department.  She is been trying to stay well-hydrated.  Review of Systems  BP 110/74   Pulse 77   Ht 5' 2.99" (1.6 m)   Wt 120 lb (54.4 kg)   SpO2 99%   BMI 21.26 kg/m     Allergies  Allergen Reactions  . Amoxicillin Rash and Other (See Comments)  . Ampicillin   . Azithromycin     REACTION: SOB  . Cephalexin   . Clarithromycin   . Codeine   . Flecainide   . Penicillins     REACTION: rash  . Shellfish Allergy     Sick to stomach   . Shellfish-Derived Products Hives and Nausea And Vomiting    Past Medical History:  Diagnosis Date  . Anal fistula   . Atrial fibrillation (HCC)   . Dyslipidemia   .  Hearing loss    75%  . Hypertension   . IBS (irritable bowel syndrome)    remote history  . Interstitial cystitis   . Menopausal state   . MVP (mitral valve prolapse)   . Paroxysmal A-fib (HCC)    status post ablation  . Swallowing difficulty    problems for second/need dentures    Past Surgical History:  Procedure Laterality Date  . CARDIAC ELECTROPHYSIOLOGY MAPPING AND ABLATION     for A fib  . CESAREAN SECTION  1971, 1973   x 2  . NASAL SEPTUM SURGERY  1977  . pulmonary vein isolation  06-2006   procedure for AFIB /[facility]    Social History   Socioeconomic History  . Marital status: Divorced    Spouse name: Not on file  . Number of children: 2  . Years of education: Not on file  . Highest education level: Not on file  Occupational History  . Occupation: retired  Engineer, production  . Financial resource strain: Not on file  . Food insecurity:    Worry: Not on file    Inability: Not on file  . Transportation needs:  Medical: Not on file    Non-medical: Not on file  Tobacco Use  . Smoking status: Never Smoker  . Smokeless tobacco: Never Used  Substance and Sexual Activity  . Alcohol use: No  . Drug use: No  . Sexual activity: Not on file    Comment: unemployed, GED, divorced,2 adult children, no caffeine, no regular exercise  Lifestyle  . Physical activity:    Days per week: Not on file    Minutes per session: Not on file  . Stress: Not on file  Relationships  . Social connections:    Talks on phone: Not on file    Gets together: Not on file    Attends religious service: Not on file    Active member of club or organization: Not on file    Attends meetings of clubs or organizations: Not on file    Relationship status: Not on file  . Intimate partner violence:    Fear of current or ex partner: Not on file    Emotionally abused: Not on file    Physically abused: Not on file    Forced sexual activity: Not on file  Other Topics Concern  . Not on  file  Social History Narrative  . Not on file    Family History  Problem Relation Age of Onset  . Hypertension Mother   . Diabetes Maternal Grandmother   . Heart disease Brother   . Stroke Paternal Aunt   . Heart attack Father   . Heart disease Sister        x 2    Outpatient Encounter Medications as of 09/26/2018  Medication Sig  . aspirin 81 MG chewable tablet Chew 81 mg by mouth daily.   Marland Kitchen CALCIUM PO Take 1 tablet by mouth daily.  . Cyanocobalamin (VITAMIN B 12 PO) Take 1,000 mcg by mouth.  . metoprolol tartrate (LOPRESSOR) 25 MG tablet Take 1 tablet (25 mg total) by mouth 2 (two) times daily.  . Multiple Vitamin (MULTIVITAMIN) capsule Take by mouth.  . polyethylene glycol powder (GLYCOLAX/MIRALAX) powder Mix 8 capfuls in 32 oz.  Complete cleanout as detailed and discharge instructions  . [DISCONTINUED] hydrocortisone (PROCTO-PAK) 1 % CREA Apply 1 application topically 2 (two) times daily as needed.  . [DISCONTINUED] Lidocaine, Anorectal, 5 % CREA Apply 1 application topically 2 (two) times daily as needed. Ok to sub 2%if cheaper  . [DISCONTINUED] naproxen (NAPROSYN) 375 MG tablet Take 1 tablet (375 mg total) by mouth 2 (two) times daily as needed for moderate pain.   No facility-administered encounter medications on file as of 09/26/2018.         Objective:   Physical Exam  Constitutional: She is oriented to person, place, and time. She appears well-developed and well-nourished.  HENT:  Head: Normocephalic and atraumatic.  Cardiovascular: Normal rate, regular rhythm and normal heart sounds.  Pulmonary/Chest: Effort normal and breath sounds normal.  Abdominal: Soft. Bowel sounds are normal. She exhibits no distension and no mass. There is no tenderness. There is no rebound and no guarding. No hernia.  Neurological: She is alert and oriented to person, place, and time.  Skin: Skin is warm and dry.  Psychiatric: She has a normal mood and affect. Her behavior is normal.         Assessment & Plan:  Constipation -continue with twice daily MiraLAX until stools are soft.  Once often okay to decrease down to once a day.  I explained it may actually take several weeks  to do a good full cleanout.  Hopefully she should have you feel better by this weekend.  Dry mouth -encouraged her to stay well-hydrated.  We can work-up further if needed.  She just had a fair amount of blood work done at the hospital which was essentially normal.  Taste buds burning.  We could consider testing her B12.  Last one was done right about a year ago.  She reported that she actually ran out of her B12 about a month ago.  She is going to try restarting that first and if symptoms still persist then will always we can always check a B12 level.

## 2018-09-26 NOTE — Telephone Encounter (Signed)
Hi Dr. Barron Alvine, please see messages below and let me know if it is ok to schedule pt with you. Thank you.

## 2018-10-04 ENCOUNTER — Telehealth: Payer: Self-pay

## 2018-10-04 MED ORDER — NYSTATIN 100000 UNIT/ML MT SUSP: 5.0000 mL | Freq: Four times a day (QID) | OROMUCOSAL | 0 refills | Status: DC

## 2018-10-04 NOTE — Telephone Encounter (Signed)
Left message advising of recommendations.  

## 2018-10-04 NOTE — Telephone Encounter (Signed)
Stephanie Rollins called and states she has thrush and would like medication sent to the pharmacy. She states she now has white patches on her tongue. Denies fever, chills or sweats.

## 2018-10-04 NOTE — Telephone Encounter (Signed)
Rx  sent for nystatin swish and swallow.  If it is not improving and she will need an appointment next week.

## 2018-10-11 ENCOUNTER — Ambulatory Visit (INDEPENDENT_AMBULATORY_CARE_PROVIDER_SITE_OTHER): Payer: Medicare Other | Admitting: Gastroenterology

## 2018-10-11 ENCOUNTER — Encounter: Payer: Self-pay | Admitting: Gastroenterology

## 2018-10-11 VITALS — BP 118/66 | HR 67 | Ht 62.75 in | Wt 114.5 lb

## 2018-10-11 DIAGNOSIS — K5900 Constipation, unspecified: Secondary | ICD-10-CM

## 2018-10-11 DIAGNOSIS — Z5320 Procedure and treatment not carried out because of patient's decision for unspecified reasons: Secondary | ICD-10-CM | POA: Diagnosis not present

## 2018-10-11 DIAGNOSIS — K219 Gastro-esophageal reflux disease without esophagitis: Secondary | ICD-10-CM | POA: Diagnosis not present

## 2018-10-11 DIAGNOSIS — K8020 Calculus of gallbladder without cholecystitis without obstruction: Secondary | ICD-10-CM

## 2018-10-11 DIAGNOSIS — R1310 Dysphagia, unspecified: Secondary | ICD-10-CM

## 2018-10-11 DIAGNOSIS — R109 Unspecified abdominal pain: Secondary | ICD-10-CM

## 2018-10-11 NOTE — Progress Notes (Signed)
P  Chief Complaint:    Dysphagia, Constipation, abdominal pain  GI History: 67 year old female previously seen by Dr. Ardis Hughs in 05/2014 for dysphagia, bloating and weight loss.  Previous to that was seen in 2007 for dysphagia with EGD in 02/2006 which was normal.  Recommended consideration of dentures at that time.  Colonoscopy in 02/2006 was reportedly normal per patient.  Repeat EGD and colonoscopy recommended in 2015 but not completed.  Celiac panel negative and normal CBC, c-Met, A1c, ESR, CRP at that time.  HPI:     Patient is a 67 y.o. female presenting to the [facility] for evaluation of abdominal pain and constipation along with ongoing dysphagia.  Was seen and [facility] on 09/23/2018 for bilateral flank and abdominal pain, and constipation.  Evaluation notable for normal CBC, CMP, UA.  CT was only notable for constipation without bowel obstruction, and cholelithiasis without GB wall thickening, duct dilation (although she reports today that it was actually nephrolithiasis despite the report saying cholelithiasis).  She was discharged with MiraLAX.  She states went to ER with b/l flank pain, radiating around back. Hadn't had BM for a few days prior. Still with some pain/pressure in R>L flank but much improved and only minimally bothersome now. Has been maintaining PO intake and having regular BMs and has since returned to baseline bowl habits of 1 BM QOD without straining.  Overall, she feels back to her baseline state of health with regards to her recent ER evaluation.  Today she states her main issue is dysphagia to solids and pills.  This is been an ongoing issue for many years, including reason for her EGD in 2007.  She states that her dysphagia has been getting worse lately.  No prior history of food impactions.  Denies odynophagia, and no fever, chills, night sweats, nausea, vomiting, hematochezia, melena. Maintains a low calorie diet since her early retirement in  2011. Weight now fluctuates but stays around her current weight for the last few years.  Weight fluctuates between 111 to 127 pounds since 2013; currently 115 pounds.  Endorses intermittent MEG pain for the last few years.  No associated nausea or vomiting.  Not associated with p.o. intake.  Does have issues with hemorrhoids in the past that will prolapse at times of straining with occasional BRB on tissue paper at times. Improved with Sitz bath and not currently bothersome.  Thinks she had a fissure in 1990's.   Otherwise labs in 08/2017 notable for normal CBC, c-Met, vitamin D, iron panel, vitamin B12, A1c   Review of systems:     No chest pain, no SOB, no fevers, no urinary sx   Past Medical History:  Diagnosis Date  . Anal fistula   . Atrial fibrillation ([place])   . Dyslipidemia   . Hearing loss    75%  . Hypertension   . IBS (irritable bowel syndrome)    remote history  . Interstitial cystitis   . Kidney stone   . Menopausal state   . MVP (mitral valve prolapse)   . Paroxysmal A-fib ([facility])    status post ablation  . Swallowing difficulty    problems for second/need dentures    Patient's surgical history, family medical history, social history, medications and allergies were all reviewed in Epic    Current Outpatient Medications  Medication Sig Dispense Refill  . aspirin 81 MG chewable tablet Chew 81 mg by mouth daily.     Marland Kitchen CALCIUM PO Take 1 tablet  by mouth daily.    . metoprolol tartrate (LOPRESSOR) 25 MG tablet Take 1 tablet (25 mg total) by mouth 2 (two) times daily. 180 tablet 3  . Multiple Vitamin (MULTIVITAMIN) capsule Take by mouth.     No current facility-administered medications for this visit.     Physical Exam:     BP 118/66   Pulse 67   Ht 5' 2.75" (1.594 m)   Wt 114 lb 8 oz (51.9 kg)   BMI 20.44 kg/m   GENERAL:  Pleasant female in NAD PSYCH: : Cooperative, normal affect EENT:  conjunctiva pink, mucous membranes moist, neck supple without  masses CARDIAC:  RRR, no murmur heard, no peripheral edema PULM: Normal respiratory effort, lungs CTA bilaterally, no wheezing ABDOMEN:  Nondistended, soft, nontender. No obvious masses, no hepatomegaly,  normal bowel sounds SKIN:  turgor, no lesions seen Musculoskeletal:  Normal muscle tone, normal strength NEURO: Alert and oriented x 3, no focal neurologic deficits   IMPRESSION and PLAN:    #1.  Dysphagia: Long-standing history of solid food dysphagia occurring with pills which she reports has been worsening lately.  Had EGD for the same indication 2007 which was normal per report.  Given her ongoing bothersome symptomatology, I recommended further evaluation with EGD with possible dilation.  She seems hesitant about doing any procedures and therefore recommended alternatively testing with esophagram or esophageal manometry.  She would like to think about all of her options and get back to me with her answer.  - Advised patient to cut food into small pieces, eat small bites, chew food thoroughly and with plenty of liquids to avoid food impaction.    #2.  CRC screening: Last colonoscopy was 2007.  Due for routine colonoscopy for average risk screening.  She declines colonoscopy for either screening or diagnostic purposes.  #3.  Constipation: Resolved with recent course of MiraLAX. -Maintain adequate fluid intake - Maintain high-fiber diet - Okay to use MiraLAX as needed in the future if symptoms recur  #4.  Flank pain: Recent ER evaluation for bilateral flank pain.  Symptoms resolved after course of MiraLAX.  Suspect secondary to underlying constipation.  CT report did not mention nephrolithiasis, but did state cholelithiasis without evidence of cholecystitis or duct dilation.  #5.  History of hemorrhoids: Reported history of hemorrhoids that responded to sitz bath in the past.  Not currently bothersome.  Worse with straining/constipation.  Did discuss the utility of colonoscopy to  evaluate for severity of internal hemorrhoids with potential for treatment with hemorrhoid band ligation, which she declines.  The indications, risks, and benefits of EGD were explained to the patient in detail. Risks include but are not limited to bleeding, perforation, adverse reaction to medications, and cardiopulmonary compromise. Sequelae include but are not limited to the possibility of surgery, hositalization, and mortality.   I spent a total of 25 minutes of face-to-face time with the patient. Greater than 50% of the time was spent counseling and coordinating care.        [place] ,DO, FACG 10/11/2018, 1:56 PM

## 2018-10-11 NOTE — Patient Instructions (Signed)
If you are age 69 or older, your body mass index should be between 23-30. Your Body mass index is 20.44 kg/m. If this is out of the aforementioned range listed, please consider follow up with your Primary Care Provider.  If you are age 69 or younger, your body mass index should be between 19-25. Your Body mass index is 20.44 kg/m. If this is out of the aformentioned range listed, please consider follow up with your Primary Care Provider.   You have been scheduled for an endoscopy. Please follow written instructions given to you at your visit today. If you use inhalers (even only as needed), please bring them with you on the day of your procedure. Your physician has requested that you go to www.startemmi.com and enter the access code given to you at your visit today. This web site gives a general overview about your procedure. However, you should still follow specific instructions given to you by our office regarding your preparation for the procedure.  It was a pleasure to see you today!  Vito Cirigliano, D.O.

## 2018-10-30 ENCOUNTER — Emergency Department (HOSPITAL_BASED_OUTPATIENT_CLINIC_OR_DEPARTMENT_OTHER): Payer: Medicare Other

## 2018-10-30 ENCOUNTER — Emergency Department (HOSPITAL_BASED_OUTPATIENT_CLINIC_OR_DEPARTMENT_OTHER)
Admission: EM | Admit: 2018-10-30 | Discharge: 2018-10-30 | Disposition: A | Discharge status: Home / Self Care | Payer: Medicare Other | Attending: Emergency Medicine | Admitting: Emergency Medicine

## 2018-10-30 ENCOUNTER — Encounter (HOSPITAL_BASED_OUTPATIENT_CLINIC_OR_DEPARTMENT_OTHER): Payer: Self-pay | Admitting: *Deleted

## 2018-10-30 ENCOUNTER — Other Ambulatory Visit: Payer: Self-pay

## 2018-10-30 DIAGNOSIS — Z7982 Long term (current) use of aspirin: Secondary | ICD-10-CM | POA: Insufficient documentation

## 2018-10-30 DIAGNOSIS — S32010A Wedge compression fracture of first lumbar vertebra, initial encounter for closed fracture: Secondary | ICD-10-CM | POA: Diagnosis not present

## 2018-10-30 DIAGNOSIS — Y999 Unspecified external cause status: Secondary | ICD-10-CM | POA: Insufficient documentation

## 2018-10-30 DIAGNOSIS — Z79899 Other long term (current) drug therapy: Secondary | ICD-10-CM | POA: Insufficient documentation

## 2018-10-30 DIAGNOSIS — Y929 Unspecified place or not applicable: Secondary | ICD-10-CM | POA: Diagnosis not present

## 2018-10-30 DIAGNOSIS — I10 Essential (primary) hypertension: Secondary | ICD-10-CM | POA: Diagnosis not present

## 2018-10-30 DIAGNOSIS — E876 Hypokalemia: Secondary | ICD-10-CM | POA: Diagnosis not present

## 2018-10-30 DIAGNOSIS — Y939 Activity, unspecified: Secondary | ICD-10-CM | POA: Diagnosis not present

## 2018-10-30 DIAGNOSIS — X58XXXA Exposure to other specified factors, initial encounter: Secondary | ICD-10-CM | POA: Diagnosis not present

## 2018-10-30 DIAGNOSIS — N3010 Interstitial cystitis (chronic) without hematuria: Secondary | ICD-10-CM

## 2018-10-30 DIAGNOSIS — R51 Headache: Secondary | ICD-10-CM | POA: Diagnosis not present

## 2018-10-30 DIAGNOSIS — G44211 Episodic tension-type headache, intractable: Secondary | ICD-10-CM | POA: Insufficient documentation

## 2018-10-30 DIAGNOSIS — K5730 Diverticulosis of large intestine without perforation or abscess without bleeding: Secondary | ICD-10-CM | POA: Diagnosis not present

## 2018-10-30 LAB — CBC WITH DIFFERENTIAL/PLATELET
Abs Immature Granulocytes: 0.02 10*3/uL (ref 0.00–0.07)
BASOS PCT: 0 %
Basophils Absolute: 0 10*3/uL (ref 0.0–0.1)
Eosinophils Absolute: 0 10*3/uL (ref 0.0–0.5)
Eosinophils Relative: 0 %
HCT: 40.8 % (ref 36.0–46.0)
HEMOGLOBIN: 12.9 g/dL (ref 12.0–15.0)
Immature Granulocytes: 0 %
Lymphocytes Relative: 19 %
Lymphs Abs: 1.5 10*3/uL (ref 0.7–4.0)
MCH: 28.7 pg (ref 26.0–34.0)
MCHC: 31.6 g/dL (ref 30.0–36.0)
MCV: 90.7 fL (ref 80.0–100.0)
Monocytes Absolute: 0.3 10*3/uL (ref 0.1–1.0)
Monocytes Relative: 4 %
Neutro Abs: 6.1 10*3/uL (ref 1.7–7.7)
Neutrophils Relative %: 77 %
Platelets: 263 10*3/uL (ref 150–400)
RBC: 4.5 MIL/uL (ref 3.87–5.11)
RDW: 12.4 % (ref 11.5–15.5)
WBC: 7.9 10*3/uL (ref 4.0–10.5)
nRBC: 0 % (ref 0.0–0.2)

## 2018-10-30 LAB — URINALYSIS, ROUTINE W REFLEX MICROSCOPIC
Bilirubin Urine: NEGATIVE
GLUCOSE, UA: NEGATIVE mg/dL
KETONES UR: NEGATIVE mg/dL
LEUKOCYTES UA: NEGATIVE
NITRITE: NEGATIVE
Protein, ur: NEGATIVE mg/dL
Specific Gravity, Urine: <1.005 — ABNORMAL LOW (ref 1.005–1.030)
pH: 6 (ref 5.0–8.0)

## 2018-10-30 LAB — COMPREHENSIVE METABOLIC PANEL
ALK PHOS: 83 U/L (ref 38–126)
ALT: 10 U/L (ref 0–44)
AST: 18 U/L (ref 15–41)
Albumin: 4.8 g/dL (ref 3.5–5.0)
Anion gap: 9 (ref 5–15)
CO2: 27 mmol/L (ref 22–32)
Calcium: 9.6 mg/dL (ref 8.9–10.3)
Chloride: 102 mmol/L (ref 98–111)
Creatinine, Ser: 0.49 mg/dL (ref 0.44–1.00)
GFR calc Af Amer: >60 mL/min (ref >60)
GFR calc non Af Amer: >60 mL/min (ref >60)
Glucose, Bld: 117 mg/dL — ABNORMAL HIGH (ref 70–99)
Potassium: 3 mmol/L — ABNORMAL LOW (ref 3.5–5.1)
SODIUM: 138 mmol/L (ref 135–145)
Total Bilirubin: 0.6 mg/dL (ref 0.3–1.2)
Total Protein: 8.4 g/dL — ABNORMAL HIGH (ref 6.5–8.1)

## 2018-10-30 LAB — URINALYSIS, MICROSCOPIC (REFLEX): WBC, UA: NONE SEEN WBC/hpf (ref 0–5)

## 2018-10-30 LAB — SEDIMENTATION RATE: SED RATE: 23 mm/h — AB (ref 0–22)

## 2018-10-30 LAB — TROPONIN I: Troponin I: <0.03 ng/mL (ref <0.03)

## 2018-10-30 LAB — C-REACTIVE PROTEIN: CRP: <0.8 mg/dL (ref <1.0)

## 2018-10-30 MED ORDER — DICLOFENAC SODIUM 1 % TD GEL: 2.0000 g | Freq: Four times a day (QID) | TRANSDERMAL | 1 refills | Status: DC

## 2018-10-30 NOTE — ED Triage Notes (Signed)
Pt c/o palpitations x 4 hrs

## 2018-10-30 NOTE — ED Provider Notes (Signed)
[facility] EMERGENCY DEPARTMENT Provider Note   CSN: [id number] Arrival date & time: 10/30/18  1348     History   Chief Complaint Chief Complaint  Patient presents with  . Palpitations    HPI Stephanie Rollins is a 67 y.o. female.  Patient is a 67 year old female with a history of hypertension, IBS, paroxysmal atrial fibrillation status post ablation presenting today with multiple complaints.  Patient states her initial complaint is of a headache that she is now had intermittently for the last 3 weeks.  She describes as a pressure behind her eyes and in her face that radiates into the top of her head and the back of her neck.  She denies pain radiating into her arms, unilateral numbness or weakness.  She has cataracts and states she has to wear glasses but has not noticed significant change in her vision.  She also complains of pain in her temple area and states she had 2 brothers who died of brain tumors and she does not usually get headaches. Secondly patient states since Wednesday she has had dysuria, frequency, urgency that has developed in the last 2 days into suprapubic pain that radiates into her left side.  She denies nausea, vomiting or fevers.  She states she does not have much of an appetite and never eats a lot but that is been ongoing for some time.  Last bowel movement was today but before that was 2 days ago.  The history is provided by the patient.  Palpitations      Past Medical History:  Diagnosis Date  . Anal fistula   . Atrial fibrillation ([facility])   . Dyslipidemia   . Hearing loss    75%  . Hypertension   . IBS (irritable bowel syndrome)    remote history  . Interstitial cystitis   . Kidney stone   . Menopausal state   . MVP (mitral valve prolapse)   . Paroxysmal A-fib ([facility])    status post ablation  . Swallowing difficulty    problems for second/need dentures    Patient Active Problem List   Diagnosis Date Noted  . Hearing loss 09/20/2017    . Non-traumatic compression fracture of T7 thoracic vertebra, sequela 12/21/2016  . Osteoarthritis of both hands 05/03/2016  . Mitral valve prolapse 08/07/2014  . PREMATURE VENTRICULAR CONTRACTIONS 10/07/2010  . GERD 11/13/2008  . OSTEOPOROSIS 10/15/2008  . INSULIN RESISTANCE SYNDROME 09/25/2008  . ECZEMA 12/29/2007  . CYSTITIS, CHRONIC INTERSTITIAL 11/17/2006  . KNEE PAIN, BILATERAL 10/24/2006  . HYPERCHOLESTEROLEMIA 08/30/2006  . ANXIETY 08/30/2006  . ATRIAL FIBRILLATION 08/30/2006  . RHINITIS, ALLERGIC 08/30/2006    Past Surgical History:  Procedure Laterality Date  . CARDIAC ELECTROPHYSIOLOGY MAPPING AND ABLATION     for A fib  . CESAREAN SECTION  1971, 1973   x 2  . NASAL SEPTUM SURGERY  1977  . pulmonary vein isolation  06-2006   procedure for AFIB /[facility]     OB History   None      Home Medications    Prior to Admission medications   Medication Sig Start Date End Date Taking? Authorizing Provider  aspirin 81 MG chewable tablet Chew 81 mg by mouth daily.     [provider]  CALCIUM PO Take 1 tablet by mouth daily.    [provider]  metoprolol tartrate (LOPRESSOR) 25 MG tablet Take 1 tablet (25 mg total) by mouth 2 (two) times daily. 07/10/18   Lewayne Bunting,  MD  Multiple Vitamin (MULTIVITAMIN) capsule Take by mouth.    [provider]    Family History Family History  Problem Relation Age of Onset  . Hypertension Mother   . Diabetes Maternal Grandmother   . Heart disease Brother   . Stroke Paternal Aunt   . Heart attack Father   . Heart disease Sister        x 2  . Colon cancer Neg Hx   . Esophageal cancer Neg Hx     Social History Social History   Tobacco Use  . Smoking status: Never Smoker  . Smokeless tobacco: Never Used  Substance Use Topics  . Alcohol use: No  . Drug use: No     Allergies   Amoxicillin; Ampicillin; Azithromycin; Cephalexin; Clarithromycin; Codeine; Flecainide; Penicillins;  Shellfish allergy; and Shellfish-derived products   Review of Systems Review of Systems  Cardiovascular: Positive for palpitations.  All other systems reviewed and are negative.    Physical Exam Updated Vital Signs BP (!) 148/70   Pulse 86   Temp 98.3 F (36.8 C)   Resp 18   Ht 5\' 3"  (1.6 m)   Wt 50.8 kg   SpO2 100%   BMI 19.84 kg/m   Physical Exam  Constitutional: She is oriented to person, place, and time. She appears well-developed and well-nourished. She appears distressed.  HENT:  Head: Normocephalic and atraumatic.  Bilateral temporal artery pain.  Dry mucous membranes  Eyes: Pupils are equal, round, and reactive to light. EOM are normal.  Cardiovascular: Normal rate, regular rhythm, normal heart sounds and intact distal pulses. Exam reveals no friction rub.  No murmur heard. Pulmonary/Chest: Effort normal and breath sounds normal. She has no wheezes. She has no rales.  Abdominal: Soft. Bowel sounds are normal. She exhibits no distension. There is tenderness in the suprapubic area. There is CVA tenderness. There is no rebound and no guarding.  Left cVA tenderness  Musculoskeletal: Normal range of motion. She exhibits no tenderness.  No edema  Neurological: She is alert and oriented to person, place, and time. No cranial nerve deficit.  Skin: Skin is warm and dry. No rash noted.  Psychiatric: She has a normal mood and affect. Her behavior is normal.  Nursing note and vitals reviewed.    ED Treatments / Results  Labs (all labs ordered are listed, but only abnormal results are displayed) Labs Reviewed  URINALYSIS, ROUTINE W REFLEX MICROSCOPIC - Abnormal; Notable for the following components:      Result Value   Color, Urine STRAW (*)    Specific Gravity, Urine <1.005 (*)    Hgb urine dipstick SMALL (*)    All other components within normal limits  COMPREHENSIVE METABOLIC PANEL - Abnormal; Notable for the following components:   Potassium 3.0 (*)    Glucose,  Bld 117 (*)    BUN <5 (*)    Total Protein 8.4 (*)    All other components within normal limits  URINALYSIS, MICROSCOPIC (REFLEX) - Abnormal; Notable for the following components:   Bacteria, UA RARE (*)    All other components within normal limits  SEDIMENTATION RATE - Abnormal; Notable for the following components:   Sed Rate 23 (*)    All other components within normal limits  URINE CULTURE  CBC WITH DIFFERENTIAL/PLATELET  TROPONIN I  C-REACTIVE PROTEIN    EKG EKG Interpretation  Date/Time:  Monday October 30 2018 13:57:59 EST Ventricular Rate:  82 PR Interval:  118 QRS Duration: 84 QT  Interval:  384 QTC Calculation: 448 R Axis:   62 Text Interpretation:  Normal sinus rhythm ST & T wave abnormality, consider inferior ischemia Confirmed by Gwyneth Sproutlunkett, Shantell Belongia ([id number]) on 10/30/2018 3:38:55 PM   Radiology Ct Abdomen Pelvis Wo Contrast  Result Date: 10/30/2018 CLINICAL DATA:  Headache and left flank pain for a few days. EXAM: CT ABDOMEN AND PELVIS WITHOUT CONTRAST TECHNIQUE: Multidetector CT imaging of the abdomen and pelvis was performed following the standard protocol without IV contrast. COMPARISON:  Plain films chest and ribs 07/26/2018. FINDINGS: Lower chest: There's some linear scar in the lung bases. No pleural or pericardial effusion. Heart size is upper normal. Hepatobiliary: Several tiny stones are seen layering dependently within the gallbladder but there is no evidence of cholecystitis. The liver and biliary tree are unremarkable. Pancreas: Unremarkable. No pancreatic ductal dilatation or surrounding inflammatory changes. Spleen: Normal in size without focal abnormality. Adrenals/Urinary Tract: Adrenal glands are unremarkable. Kidneys are normal, without renal calculi or hydronephrosis. 1.6 cm simple cyst lower pole right kidney noted. Bladder is unremarkable. Stomach/Bowel: A few sigmoid diverticula are seen. The colon is otherwise unremarkable. The stomach, small bowel and  appendix appear normal. Vascular/Lymphatic: Aortic atherosclerosis. No enlarged abdominal or pelvic lymph nodes. Reproductive: Uterus and bilateral adnexa are unremarkable. Other: None. Musculoskeletal: The patient has a superior endplate compression fracture of L1 with vertebral body height loss of approximately 40%. No notable bony retropulsion. The fracture cannot be definitively characterized but it appears acute or early subacute. It is not seen on the prior plain film of the chest and ribs. IMPRESSION: Negative for urinary tract stone. L1 superior endplate compression fracture with vertebral body height loss of approximately 40% cannot be definitively characterized but appears acute or subacute. It is new since plain films of the chest 07/26/2018. Gallstones without evidence of cholecystitis. Atherosclerosis. Mild sigmoid diverticulosis without diverticulitis. Electronically Signed   By: Drusilla Kannerhomas  Dalessio M.D.   On: 10/30/2018 16:11   Ct Head Wo Contrast  Result Date: 10/30/2018 CLINICAL DATA:  67 y/o  F; headache, left flank pain. EXAM: CT HEAD WITHOUT CONTRAST TECHNIQUE: Contiguous axial images were obtained from the base of the skull through the vertex without intravenous contrast. COMPARISON:  10/23/2018 CT head. FINDINGS: Brain: No evidence of acute infarction, hemorrhage, hydrocephalus, extra-axial collection or mass lesion/mass effect. Few nonspecific white matter hypodensities are compatible with mild chronic microvascular ischemic changes and stable. Mild stable volume loss of the brain. Vascular: Calcific atherosclerosis of carotid siphons. No hyperdense vessel identified. Skull: Normal. Negative for fracture or focal lesion. Sinuses/Orbits: No acute finding. Other: None. IMPRESSION: 1. No acute intracranial abnormality identified. 2. Stable chronic microvascular ischemic changes and volume loss of the brain. Electronically Signed   By: Mitzi HansenLance  Furusawa-Stratton M.D.   On: 10/30/2018 16:03     Procedures Procedures (including critical care time)  Medications Ordered in ED Medications - No data to display   Initial Impression / Assessment and Plan / ED Course  I have reviewed the triage vital signs and the nursing notes.  Pertinent labs & imaging results that were available during my care of the patient were reviewed by me and considered in my medical decision making (see chart for details).    Patient presenting today with multiple complaints in various different systems.  She has 2 separate complaints today of pain in her abdomen and dysuria.  Patient did have a bowel movement today but states she had not had one for 2 days prior.  She complains of  hesitancy and urgency.  On exam patient has some suprapubic tenderness and flank tenderness however UA with no convincing evidence of a UTI.  CBC without leukocytosis.  Patient has denied fever or vomiting.  She did describe a week episode today with palpitations and a heart rate of 120 but she states that has resolved since the patient's been here her heart rate has been less than 80.  She has no evidence of respiratory distress and has clear breath sounds.  She also complains of atypical headache such as she describes as pressure in her head.  They come and go but are becoming more prominent.  We will do a CT to ensure no space-occupying lesion or other acute deficits.  Low suspicion for stroke.  She does have temporal artery discomfort and will check a sed rate. EKG today with more prominent ST changes than prior we will repeat to ensure this was not artifact.  Troponin, CMP, head CT, CT the abdomen pelvis without contrast is pending.  Patient had similar episodes approximately 1 month ago and at that time was found to have constipation.  However she reports she does have history of kidney stones.  6:00 PM Labs today are reassuring.  CT showed a new L1 endplate compression fracture that was 40% that was not present 1 month ago.  This  could be the cause of some of her back pain.  She denies any trauma but does have a history of osteoporosis.  She is neurologically intact and at this time does not need further intervention.  Patient was given Voltaren gel and she will take Tylenol.  She will follow-up with her PCP and if pain continues may get with kyphoplasty.  Patient also has a history of interstitial cystitis which is probably what is causing her bladder issues.  There is no findings to suggest acute intracranial pathology as the cause of her headache.  Patient's sed rate is within normal limits and low suspicion for temporal arteritis.  Feel that patient is stable for discharge and encourage close follow-up with her PCP and outpatient specialist.  Final Clinical Impressions(s) / ED Diagnoses   Final diagnoses:  Compression fracture of L1 vertebra, initial encounter ([facility])  Hypokalemia  Intractable episodic tension-type headache  Interstitial cystitis    ED Discharge Orders         Ordered    diclofenac sodium (VOLTAREN) 1 % GEL  4 times daily     10/30/18 1756           Gwyneth Sprout, MD 10/30/18 1801

## 2018-10-30 NOTE — Discharge Instructions (Signed)
Make sure you are eating plenty of foods with potassium.  Use Tylenol and the Voltaren gel to help with the pain in your back and your neck.  So a heating pad may be helpful.  Make sure you are doing no heavy lifting or excessive bending.  If at anytime you develop numbness or weakness in your legs you need to be seen by your doctor.  The compression fracture should heal on its own but you should continue per your potassium and consider taking additional medications for your osteoporosis.  If your interstitial cystitis continues to bother you you should follow-up with urology.  A culture of your urine has been sent today to look for any bacterial infections but there is no sign today on your urine that there is infection.

## 2018-10-30 NOTE — ED Notes (Signed)
Pt verbalizes understanding of d/c instructions and denies any further needs at this time. 

## 2018-10-30 NOTE — ED Notes (Signed)
Pt given graham crackers to eat per her request

## 2018-10-31 ENCOUNTER — Ambulatory Visit (INDEPENDENT_AMBULATORY_CARE_PROVIDER_SITE_OTHER): Payer: Medicare Other | Admitting: Family Medicine

## 2018-10-31 ENCOUNTER — Encounter: Payer: Self-pay | Admitting: Family Medicine

## 2018-10-31 DIAGNOSIS — S32000A Wedge compression fracture of unspecified lumbar vertebra, initial encounter for closed fracture: Secondary | ICD-10-CM

## 2018-10-31 DIAGNOSIS — S32050A Wedge compression fracture of fifth lumbar vertebra, initial encounter for closed fracture: Secondary | ICD-10-CM | POA: Insufficient documentation

## 2018-10-31 NOTE — Patient Instructions (Addendum)
Thank you for coming in today. Recheck in 2 weeks.  Return sooner if needed.   Use the back brace while awake.   Continue tylenol for pain as needed.    Spinal Compression Fracture A spinal compression fracture is a collapse of the bones that form the spine (vertebrae). With this type of fracture, the vertebrae become squashed (compressed) into a wedge shape. Most compression fractures happen in the middle or lower part of the spine. What are the causes? This condition may be caused by:  Thinning and loss of density in the bones (osteoporosis). This is the most common cause.  A fall.  A car or motorcycle accident.  Cancer.  Trauma, such as a heavy, direct hit to the head.  What increases the risk? You may be at greater risk for a spinal compression fracture if you:  Are 55 years old or older.  Have osteoporosis.  Have certain types of cancer, including: ? Multiple myeloma. ? Lymphoma. ? Prostate cancer. ? Lung cancer. ? Breast cancer.  What are the signs or symptoms? Symptoms of this condition include:  Severe pain.  Pain that gets worse over time.  Pain that is worse when you stand, walk, sit, or bend.  Sudden pain that is so bad that it is hard for you to move.  Bending or humping of the spine.  Gradual loss of height.  Numbness, tingling, or weakness in the back and legs.  Trouble walking.  Your symptoms will depend on the cause of the fracture and how quickly it develops. For example, fractures that are caused by osteoporosis can cause few symptoms, no symptoms, or symptoms that develop slowly over time. How is this diagnosed? This condition may be diagnosed based on symptoms, medical history, and a physical exam. During the physical exam, your health care provider may tap along the length of your spine to check for tenderness. Tests may be done to confirm the diagnosis. They may include:  A bone density test to check for osteoporosis.  Imaging  tests, such as a spine X-ray, a CT scan, or MRI.  How is this treated? Treatment for this condition depends on the cause and severity of the condition.Some fractures, such as those that are caused by osteoporosis, may heal on their own with supportive care. This may include:  Pain medicine.  Rest.  A back brace.  Physical therapy exercises.  Medicine that reduces bone pain.  Calcium and vitamin D supplements.  Fractures that cause the back to become misshapen, cause nerve pain or weakness, or do not respond to other treatment may be treated with a surgical procedure, such as:  Vertebroplasty. In this procedure, bone cement is injected into the collapsed vertebrae to stabilize them.  Balloon kyphoplasty. In this procedure, the collapsed vertebrae are expanded with a balloon and then bone cement is injected into them.  Spinal fusion. In this procedure, the collapsed vertebrae are connected (fused) to normal vertebrae.  Follow these instructions at home: General instructions  Take medicines only as directed by your health care provider.  Do not drive or operate heavy machinery while taking pain medicine.  If directed, apply ice to the injured area: ? Put ice in a plastic bag. ? Place a towel between your skin and the bag. ? Leave the ice on for 30 minutes every two hours at first. Then apply the ice as needed.  Wear your neck brace or back brace as directed by your health care provider.  Do not drink  alcohol. Alcohol can interfere with your treatment.  Keep all follow-up visits as directed by your health care provider. This is important. It can help to prevent permanent injury, disability, and long-lasting (chronic) pain. Activity  Stay in bed (on bed rest) only as directed by your health care provider. Being on bed rest for too long can make your condition worse.  Return to your normal activities as directed by your health care provider. Ask what activities are safe for  you.  Do exercises to improve motion and strength in your back (physical therapy), as recommended by your health care provider.  Exercise regularly as directed by your health care provider. Contact a health care provider if:  You have a fever.  You develop a cough that makes your pain worse.  Your pain medicine is not helping.  Your pain does not get better over time.  You cannot return to your normal activities as planned or expected. Get help right away if:  Your pain is very bad and it suddenly gets worse.  You are unable to move any body part (paralysis) that is below the level of your injury.  You have numbness, tingling, or weakness in any body part that is below the level of your injury.  You cannot control your bladder or bowels. This information is not intended to replace advice given to you by your health care provider. Make sure you discuss any questions you have with your health care provider. Document Released: 11/08/2005 Document Revised: 07/06/2016 Document Reviewed: 11/12/2014 Elsevier Interactive Patient Education  [employer].

## 2018-10-31 NOTE — Progress Notes (Addendum)
Stephanie Rollins is a 67 y.o. female who presents to [facility] today for back pain.  Stephanie Rollins was seen in the emergency room for a variety of issues yesterday.  Her most dominant finding was a new appearing L1 vertebral compression fracture.  She notes that she developed back pain about 4 5 days ago after sitting down hard.  She cannot think of any other injury.  She denies any new radiating pain weakness or numbness or new bowel bladder dysfunction.  She does have urinary frequency and urgency related to her chronic cystitis.  In the emergency department she had significant metabolic and urinary work-up that was effectively unrevealing at this time.  Culture is pending however.  She notes that she is been trying to take Tylenol for her back pain which is not sufficient to control her pain.  She rates her pain is moderate to severe and interferes with her ability to get around the house clean and walk normally at times.  Pain is worse with activity better with rest but does also occur at rest.    ROS:  As above  Exam:  BP (!) 127/55   Pulse 85   Wt 118 lb (53.5 kg)   BMI 20.90 kg/m  General: Well Developed, well nourished, and in no acute distress.  Neuro/Psych: Alert and oriented x3, extra-ocular muscles intact, able to move all 4 extremities, sensation grossly intact. Skin: Warm and dry, no rashes noted.  Respiratory: Not using accessory muscles, speaking in full sentences, trachea midline.  Cardiovascular: Pulses palpable, no extremity edema. Abdomen: Does not appear distended. MSK:  L-spine: Tender to palpation lumbar midline around the region of L1.  Nontender otherwise. Decreased lumbar motion due to pain. Lower extremity strength is intact throughout lower extremities. Mild antalgic gait.  Patient had significant symptom improvement after fitting with LSO brace.    Lab and Radiology Results Results for orders placed or performed  during the hospital encounter of 10/30/18 (from the past 72 hour(s))  Urinalysis, Routine w reflex microscopic     Status: Abnormal   Collection Time: 10/30/18  1:58 PM  Result Value Ref Range   Color, Urine STRAW (A) YELLOW   APPearance CLEAR CLEAR   Specific Gravity, Urine <1.005 (L) 1.005 - 1.030   pH 6.0 5.0 - 8.0   Glucose, UA NEGATIVE NEGATIVE mg/dL   Hgb urine dipstick SMALL (A) NEGATIVE   Bilirubin Urine NEGATIVE NEGATIVE   Ketones, ur NEGATIVE NEGATIVE mg/dL   Protein, ur NEGATIVE NEGATIVE mg/dL   Nitrite NEGATIVE NEGATIVE   Leukocytes, UA NEGATIVE NEGATIVE    Comment: Performed at [facility], [address] [place], [place] [address]  Urinalysis, Microscopic (reflex)     Status: Abnormal   Collection Time: 10/30/18  1:58 PM  Result Value Ref Range   RBC / HPF 0-5 0 - 5 RBC/hpf   WBC, UA NONE SEEN 0 - 5 WBC/hpf   Bacteria, UA RARE (A) NONE SEEN   Squamous Epithelial / LPF 0-5 0 - 5    Comment: Performed at [facility], [address] [place], [place] [address]  CBC with Differential     Status: None   Collection Time: 10/30/18  3:13 PM  Result Value Ref Range   WBC 7.9 4.0 - 10.5 K/uL   RBC 4.50 3.87 - 5.11 MIL/uL   Hemoglobin 12.9 12.0 - 15.0 g/dL   HCT 09.8 11.9 - 14.7 %  MCV 90.7 80.0 - 100.0 fL   MCH 28.7 26.0 - 34.0 pg   MCHC 31.6 30.0 - 36.0 g/dL   RDW 82.912.4 56.211.5 - 13.015.5 %   Platelets 263 150 - 400 K/uL   nRBC 0.0 0.0 - 0.2 %   Neutrophils Relative % 77 %   Neutro Abs 6.1 1.7 - 7.7 K/uL   Lymphocytes Relative 19 %   Lymphs Abs 1.5 0.7 - 4.0 K/uL   Monocytes Relative 4 %   Monocytes Absolute 0.3 0.1 - 1.0 K/uL   Eosinophils Relative 0 %   Eosinophils Absolute 0.0 0.0 - 0.5 K/uL   Basophils Relative 0 %   Basophils Absolute 0.0 0.0 - 0.1 K/uL   Immature Granulocytes 0 %   Abs Immature Granulocytes 0.02 0.00 - 0.07 K/uL    Comment: Performed at [facility], [address] [place], [place] [address]  Comprehensive  metabolic panel     Status: Abnormal   Collection Time: 10/30/18  3:13 PM  Result Value Ref Range   Sodium 138 135 - 145 mmol/L   Potassium 3.0 (L) 3.5 - 5.1 mmol/L   Chloride 102 98 - 111 mmol/L   CO2 27 22 - 32 mmol/L   Glucose, Bld 117 (H) 70 - 99 mg/dL   BUN <5 (L) 8 - 23 mg/dL   Creatinine, Ser 4.690.49 0.44 - 1.00 mg/dL   Calcium 9.6 8.9 - 62.910.3 mg/dL   Total Protein 8.4 (H) 6.5 - 8.1 g/dL   Albumin 4.8 3.5 - 5.0 g/dL   AST 18 15 - 41 U/L   ALT 10 0 - 44 U/L   Alkaline Phosphatase 83 38 - 126 U/L   Total Bilirubin 0.6 0.3 - 1.2 mg/dL   GFR calc non Af Amer >60 >60 mL/min   GFR calc Af Amer >60 >60 mL/min   Anion gap 9 5 - 15    Comment: Performed at [facility], [address] [place], [place] [address]  Troponin I - ONCE - STAT     Status: None   Collection Time: 10/30/18  3:13 PM  Result Value Ref Range   Troponin I <0.03 <0.03 ng/mL    Comment: Performed at [facility], [address] [place], [place] [address]  Sedimentation rate     Status: Abnormal   Collection Time: 10/30/18  3:13 PM  Result Value Ref Range   Sed Rate 23 (H) 0 - 22 mm/hr    Comment: Performed at [facility] [place], [address] [place], [place] [address]  C-reactive protein     Status: None   Collection Time: 10/30/18  3:13 PM  Result Value Ref Range   CRP <0.8 <1.0 mg/dL    Comment: Performed at [facility], [address] [place], [place] [address]   Ct Abdomen Pelvis Wo Contrast  Result Date: 10/30/2018 CLINICAL DATA:  Headache and left flank pain for a few days. EXAM: CT ABDOMEN AND PELVIS WITHOUT CONTRAST TECHNIQUE: Multidetector CT imaging of the abdomen and pelvis was performed following the standard protocol without IV contrast. COMPARISON:  Plain films chest and ribs 07/26/2018. FINDINGS: Lower chest: There's some linear scar in the lung bases. No pleural or pericardial effusion. Heart size is upper normal. Hepatobiliary: Several tiny stones are seen  layering dependently within the gallbladder but there is no evidence of cholecystitis. The liver and biliary tree are unremarkable. Pancreas: Unremarkable. No pancreatic ductal  dilatation or surrounding inflammatory changes. Spleen: Normal in size without focal abnormality. Adrenals/Urinary Tract: Adrenal glands are unremarkable. Kidneys are normal, without renal calculi or hydronephrosis. 1.6 cm simple cyst lower pole right kidney noted. Bladder is unremarkable. Stomach/Bowel: A few sigmoid diverticula are seen. The colon is otherwise unremarkable. The stomach, small bowel and appendix appear normal. Vascular/Lymphatic: Aortic atherosclerosis. No enlarged abdominal or pelvic lymph nodes. Reproductive: Uterus and bilateral adnexa are unremarkable. Other: None. Musculoskeletal: The patient has a superior endplate compression fracture of L1 with vertebral body height loss of approximately 40%. No notable bony retropulsion. The fracture cannot be definitively characterized but it appears acute or early subacute. It is not seen on the prior plain film of the chest and ribs. IMPRESSION: Negative for urinary tract stone. L1 superior endplate compression fracture with vertebral body height loss of approximately 40% cannot be definitively characterized but appears acute or subacute. It is new since plain films of the chest 07/26/2018. Gallstones without evidence of cholecystitis. Atherosclerosis. Mild sigmoid diverticulosis without diverticulitis. Electronically Signed   By: Drusilla Kanner M.D.   On: 10/30/2018 16:11   Ct Head Wo Contrast  Result Date: 10/30/2018 CLINICAL DATA:  67 y/o  F; headache, left flank pain. EXAM: CT HEAD WITHOUT CONTRAST TECHNIQUE: Contiguous axial images were obtained from the base of the skull through the vertex without intravenous contrast. COMPARISON:  10/23/2018 CT head. FINDINGS: Brain: No evidence of acute infarction, hemorrhage, hydrocephalus, extra-axial collection or mass  lesion/mass effect. Few nonspecific white matter hypodensities are compatible with mild chronic microvascular ischemic changes and stable. Mild stable volume loss of the brain. Vascular: Calcific atherosclerosis of carotid siphons. No hyperdense vessel identified. Skull: Normal. Negative for fracture or focal lesion. Sinuses/Orbits: No acute finding. Other: None. IMPRESSION: 1. No acute intracranial abnormality identified. 2. Stable chronic microvascular ischemic changes and volume loss of the brain. Electronically Signed   By: Mitzi Hansen M.D.   On: 10/30/2018 16:03   I personally (independently) visualized and performed the interpretation of the images of the CT abdomen and pelvis attached in this note.     Assessment and Plan: 67 y.o. female with  Lumbar pain due to acute appearing L1 vertebral compression fracture.  Plan to treat with Tylenol and LSO brace.  If not controlled on recheck neck step would be trial of opiate or nasal calcitonin if available.  If not better with conservative management patient may be a good candidate for kyphoplasty or vertebroplasty. Recheck 2 weeks.  Addendum: Back brace ordered to reduce pain by limiting mobility of trunk.  Pain due to compression fracture.  I spent 25 minutes with this patient, greater than 50% was face-to-face time counseling regarding findings treatment plan options and backup plan.Marland Kitchen    Historical information moved to improve visibility of documentation.  Past Medical History:  Diagnosis Date  . Anal fistula   . Atrial fibrillation (HCC)   . Dyslipidemia   . Hearing loss    75%  . Hypertension   . IBS (irritable bowel syndrome)    remote history  . Interstitial cystitis   . Kidney stone   . Menopausal state   . MVP (mitral valve prolapse)   . Paroxysmal A-fib (HCC)    status post ablation  . Swallowing difficulty    problems for second/need dentures   Past Surgical History:  Procedure Laterality Date  .  CARDIAC ELECTROPHYSIOLOGY MAPPING AND ABLATION     for A fib  . CESAREAN SECTION  1971, [id number]  x 2  . NASAL SEPTUM SURGERY  1977  . pulmonary vein isolation  06-2006   procedure for AFIB /[facility]   Social History   Tobacco Use  . Smoking status: Never Smoker  . Smokeless tobacco: Never Used  Substance Use Topics  . Alcohol use: No   family history includes Diabetes in her maternal grandmother; Heart attack in her father; Heart disease in her brother and sister; Hypertension in her mother; Stroke in her paternal aunt.  Medications: Current Outpatient Medications  Medication Sig Dispense Refill  . aspirin 81 MG chewable tablet Chew 81 mg by mouth daily.     Marland Kitchen CALCIUM PO Take 1 tablet by mouth daily.    . diclofenac sodium (VOLTAREN) 1 % GEL Apply 2 g topically 4 (four) times daily. 100 g 1  . metoprolol tartrate (LOPRESSOR) 25 MG tablet Take 1 tablet (25 mg total) by mouth 2 (two) times daily. 180 tablet 3  . Multiple Vitamin (MULTIVITAMIN) capsule Take by mouth.     No current facility-administered medications for this visit.    Allergies  Allergen Reactions  . Amoxicillin Rash and Other (See Comments)  . Ampicillin   . Azithromycin     REACTION: SOB  . Cephalexin   . Clarithromycin   . Codeine   . Flecainide   . Penicillins     REACTION: rash  . Shellfish Allergy     Sick to stomach   . Shellfish-Derived Products Hives and Nausea And Vomiting      Discussed warning signs or symptoms. Please see discharge instructions. Patient expresses understanding.

## 2018-11-01 LAB — URINE CULTURE: Culture: <10000 — AB

## 2018-11-08 ENCOUNTER — Encounter: Payer: Medicare Other | Admitting: Gastroenterology

## 2018-11-10 ENCOUNTER — Telehealth: Payer: Self-pay

## 2018-11-10 MED ORDER — ALPRAZOLAM 0.25 MG PO TABS: 0.2500 mg | ORAL_TABLET | Freq: Every day | ORAL | 0 refills | Status: DC | PRN

## 2018-11-10 NOTE — Telephone Encounter (Signed)
Left VM with update.  

## 2018-11-10 NOTE — Telephone Encounter (Signed)
Stephanie Rollins called and left a message stating she is feeling very anxious about all her health problems. She is requesting Xanax. She stated she has taken it in the past for a short period of time. Please advise.

## 2018-11-10 NOTE — Telephone Encounter (Signed)
Never a small quantity.

## 2018-11-13 ENCOUNTER — Emergency Department (INDEPENDENT_AMBULATORY_CARE_PROVIDER_SITE_OTHER)
Admission: EM | Admit: 2018-11-13 | Discharge: 2018-11-13 | Disposition: A | Discharge status: Home / Self Care | Payer: Medicare Other | Source: Home / Self Care

## 2018-11-13 ENCOUNTER — Encounter: Payer: Self-pay | Admitting: Emergency Medicine

## 2018-11-13 ENCOUNTER — Other Ambulatory Visit: Payer: Self-pay

## 2018-11-13 DIAGNOSIS — R002 Palpitations: Secondary | ICD-10-CM

## 2018-11-13 DIAGNOSIS — B370 Candidal stomatitis: Secondary | ICD-10-CM | POA: Diagnosis not present

## 2018-11-13 DIAGNOSIS — I4811 Longstanding persistent atrial fibrillation: Secondary | ICD-10-CM

## 2018-11-13 MED ORDER — FLUCONAZOLE 100 MG PO TABS: 100.0000 mg | ORAL_TABLET | Freq: Every day | ORAL | 0 refills | Status: DC

## 2018-11-13 NOTE — ED Provider Notes (Signed)
[facility]    CSN: [id number] Arrival date & time: 11/13/18  1916     History   Chief Complaint Chief Complaint  Patient presents with  . Dental Pain    mouth mucosa  . Sore Throat    HPI Stephanie Rollins is a 67 y.o. female.   This is a 67 year old woman comes in complaining of thrush.  She is an established patient.  Patient is been having sore mouth for several weeks now.  She was treated for thrush several weeks ago and seemed to be a better but never completely resolved her burning sensation in her mouth.  Her blood sugar has been running around 110 with random checks.  She has been under great stress recently because of a compression fracture in her back, her "baby" brother having passed away recently.  She was supposed have a B12 checked recently but it did not get done.  She had some palpitations this morning when she measured her pulse at about 125.  Subsequently the palpitations have resolved and the rapid heartbeat has subsided.  She has had atrial fibrillation in the past.  She has seen Dr. Jens Somrenshaw several times for the atrial fibrillation.  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     Past Medical History:  Diagnosis Date  . Anal fistula   . Atrial fibrillation (HCC)   . Dyslipidemia   . Hearing loss    75%  . Hypertension   . IBS (irritable bowel syndrome)    remote history  . Interstitial cystitis   . Kidney stone   . Menopausal state   . MVP (mitral valve prolapse)   . Paroxysmal A-fib (HCC)    status post ablation  . Swallowing difficulty    problems for second/need dentures    Patient Active Problem List   Diagnosis Date Noted  . Lumbar compression fracture, closed, initial encounter (HCC) 10/31/2018  . Hearing loss 09/20/2017  . Non-traumatic compression fracture of T7 thoracic vertebra, sequela 12/21/2016  . Osteoarthritis of both hands 05/03/2016  . Mitral valve prolapse 08/07/2014  . PREMATURE VENTRICULAR CONTRACTIONS  10/07/2010  . GERD 11/13/2008  . OSTEOPOROSIS 10/15/2008  . INSULIN RESISTANCE SYNDROME 09/25/2008  . ECZEMA 12/29/2007  . CYSTITIS, CHRONIC INTERSTITIAL 11/17/2006  . KNEE PAIN, BILATERAL 10/24/2006  . HYPERCHOLESTEROLEMIA 08/30/2006  . ANXIETY 08/30/2006  . ATRIAL FIBRILLATION 08/30/2006  . RHINITIS, ALLERGIC 08/30/2006    Past Surgical History:  Procedure Laterality Date  . CARDIAC ELECTROPHYSIOLOGY MAPPING AND ABLATION     for A fib  . CESAREAN SECTION  1971, 1973   x 2  . NASAL SEPTUM SURGERY  1977  . pulmonary vein isolation  06-2006   procedure for AFIB /[facility]    OB History   No obstetric history on file.      Home Medications    Prior to Admission medications   Medication Sig Start Date End Date Taking? Authorizing Provider  ALPRAZolam (XANAX) 0.25 MG tablet Take 1 tablet (0.25 mg total) by mouth daily as needed for anxiety. 11/10/18   Agapito GamesMetheney, Catherine D, MD  aspirin 81 MG chewable tablet Chew 81 mg by mouth daily.     [provider]  CALCIUM PO Take 1 tablet by mouth daily.    [provider]  diclofenac sodium (VOLTAREN) 1 % GEL Apply 2 g topically 4 (four) times daily. 10/30/18   Gwyneth SproutPlunkett, Whitney, MD  fluconazole (DIFLUCAN) 100 MG tablet Take 1 tablet (100 mg total)  by mouth daily. 11/13/18   Elvina Sidle, MD  metoprolol tartrate (LOPRESSOR) 25 MG tablet Take 1 tablet (25 mg total) by mouth 2 (two) times daily. 07/10/18   Lewayne Bunting, MD  Multiple Vitamin (MULTIVITAMIN) capsule Take by mouth.    [provider]    Family History Family History  Problem Relation Age of Onset  . Hypertension Mother   . Diabetes Maternal Grandmother   . Heart disease Brother   . Stroke Paternal Aunt   . Heart attack Father   . Heart disease Sister        x 2  . Colon cancer Neg Hx   . Esophageal cancer Neg Hx     Social History Social History   Tobacco Use  . Smoking status: Never Smoker  . Smokeless tobacco: Never  Used  Substance Use Topics  . Alcohol use: No  . Drug use: No     Allergies   Amoxicillin; Ampicillin; Azithromycin; Cephalexin; Clarithromycin; Codeine; Flecainide; Penicillins; Shellfish allergy; and Shellfish-derived products   Review of Systems Review of Systems   Physical Exam Triage Vital Signs ED Triage Vitals  Enc Vitals Group     BP 11/13/18 1941 (!) 163/65     Pulse Rate 11/13/18 1941 93     Resp 11/13/18 1941 16     Temp 11/13/18 1941 97.9 F (36.6 C)     Temp Source 11/13/18 1941 Oral     SpO2 11/13/18 1941 98 %     Weight 11/13/18 1942 117 lb 15.1 oz (53.5 kg)     Height 11/13/18 1942 5\' 3"  (1.6 m)     Head Circumference --      Peak Flow --      Pain Score --      Pain Loc --      Pain Edu? --      Excl. in GC? --    No data found.  Updated Vital Signs BP (!) 163/65 (BP Location: Right Arm)   Pulse 93   Temp 97.9 F (36.6 C) (Oral)   Resp 18   Ht 5\' 3"  (1.6 m)   Wt 53.5 kg   SpO2 98%   BMI 20.89 kg/m    Physical Exam Vitals signs and nursing note reviewed.  Constitutional:      Appearance: She is well-developed and normal weight.  HENT:     Head:     Comments: Patient has moderate alopecia    Mouth/Throat:     Mouth: Mucous membranes are moist.     Pharynx: Posterior oropharyngeal erythema present. No oropharyngeal exudate.  Cardiovascular:     Rate and Rhythm: Normal rate. Rhythm irregular.  Pulmonary:     Effort: Pulmonary effort is normal.     Breath sounds: Normal breath sounds.  Skin:    General: Skin is warm.  Neurological:     General: No focal deficit present.     Mental Status: She is alert and oriented to person, place, and time.  Psychiatric:        Mood and Affect: Mood normal.        Behavior: Behavior normal.      UC Treatments / Results  Labs (all labs ordered are listed, but only abnormal results are displayed) Labs Reviewed  VITAMIN B12    EKG None  Radiology No results  found.  Procedures Procedures (including critical care time)  Medications Ordered in UC Medications - No data to display  Initial Impression / Assessment  and Plan / UC Course  I have reviewed the triage vital signs and the nursing notes.  Pertinent labs & imaging results that were available during my care of the patient were reviewed by me and considered in my medical decision making (see chart for details).    Final Clinical Impressions(s) / UC Diagnoses   Final diagnoses:  Oral thrush  Palpitations  Longstanding persistent atrial fibrillation   Discharge Instructions   None    ED Prescriptions    Medication Sig Dispense Auth. Provider   fluconazole (DIFLUCAN) 100 MG tablet Take 1 tablet (100 mg total) by mouth daily. 7 tablet Elvina SidleLauenstein, Kymir Coles, MD     Controlled Substance Prescriptions [place] Controlled Substance Registry consulted? Not Applicable   Elvina SidleLauenstein, Lacheryl Niesen, MD 11/13/18 2020

## 2018-11-13 NOTE — ED Triage Notes (Signed)
Reports soreness of the oral mucosa with history of thrush about a month ago; now sides of throat are also sore.

## 2018-11-14 ENCOUNTER — Telehealth: Payer: Self-pay

## 2018-11-14 LAB — VITAMIN B12: Vitamin B-12: 587 pg/mL (ref 200–1100)

## 2018-11-14 NOTE — Telephone Encounter (Signed)
Left voice message inquiring about patients status. Encouraged patient to call with questions or concerns. Notified of normal lab results.

## 2018-11-16 ENCOUNTER — Ambulatory Visit (INDEPENDENT_AMBULATORY_CARE_PROVIDER_SITE_OTHER): Payer: Medicare Other

## 2018-11-16 ENCOUNTER — Other Ambulatory Visit: Payer: Self-pay

## 2018-11-16 ENCOUNTER — Ambulatory Visit (INDEPENDENT_AMBULATORY_CARE_PROVIDER_SITE_OTHER): Payer: Medicare Other | Admitting: Family Medicine

## 2018-11-16 ENCOUNTER — Encounter: Payer: Self-pay | Admitting: Family Medicine

## 2018-11-16 VITALS — BP 131/76 | HR 85 | Temp 98.1°F | Wt 109.6 lb

## 2018-11-16 DIAGNOSIS — M4856XD Collapsed vertebra, not elsewhere classified, lumbar region, subsequent encounter for fracture with routine healing: Secondary | ICD-10-CM | POA: Diagnosis not present

## 2018-11-16 DIAGNOSIS — S32000A Wedge compression fracture of unspecified lumbar vertebra, initial encounter for closed fracture: Secondary | ICD-10-CM

## 2018-11-16 DIAGNOSIS — I480 Paroxysmal atrial fibrillation: Secondary | ICD-10-CM | POA: Diagnosis not present

## 2018-11-16 DIAGNOSIS — M545 Low back pain: Secondary | ICD-10-CM | POA: Diagnosis not present

## 2018-11-16 NOTE — Patient Instructions (Addendum)
Thank you for coming in today. Continue brace for the back.  Follow up with Dr Linford ArnoldMetheney for the hear issues as needed.  Recheck with me in 4 weeks.  Return sooner if needed.

## 2018-11-16 NOTE — Progress Notes (Signed)
Stephanie Rollins is a 67 y.o. female who presents to [facility] today for follow-up compression fracture.  Stephanie Rollins was seen on December 10 in my clinic for a new compression fracture at L1.  This was evaluated with CT scan of the abdomen and pelvis.  At that time pain was moderate and she was treated with Tylenol and LSO brace.  She is here today for recheck.  She notes that her back pain has improved considerably.  She is feeling much better with no severe pain.  She is able to lay down in bed which has been painful previously.  She is happy with how things are going.  She has a history of palpitations and had an episode of tachypalpitations earlier today.  She is concerned and would like an EKG done if possible.  She notes that at the time of her visit she does not have tachypalpitations or chest pain.  She denies significant shortness of breath.  She takes half a pill of her metoprolol twice daily.    ROS:  As above  Exam:  BP 131/76   Pulse 85   Temp 98.1 F (36.7 C) (Oral)   Wt 109 lb 9.6 oz (49.7 kg)   BMI 19.41 kg/m  Gen: Well NAD HEENT: EOMI,  MMM Lungs: CTABL Nl WOB Heart: RRR no MRG Abd: NABS, NT, ND Exts: Non edematous BL  LE, warm and well perfused.  Lspine: Non-tender     Lab and Radiology Results  Twelve-lead EKG shows normal sinus rhythm with no ST segment elevation or depression.  Some PACs present.  No atrial fibrillation.  No significant change from prior EKG.  Dg Lumbar Spine Complete  Result Date: 11/16/2018 CLINICAL DATA:  Back pain. EXAM: LUMBAR SPINE - COMPLETE 4+ VIEW COMPARISON:  CT scan 10/30/2018 FINDINGS: Normal alignment of the lumbar vertebral bodies. Remote compression fracture of L1. No new/acute fractures are identified. Disc spaces are maintained. Mild stable facet disease but no definite pars defects. Moderate atherosclerotic calcifications involving the aorta and iliac arteries but no definite  aneurysm. The visualized bony pelvis is intact. Both hips are normally located. IMPRESSION: Remote L1 compression fracture.  No acute bony findings. Electronically Signed   By: Rudie MeyerP.  Gallerani M.D.   On: 11/16/2018 15:26  I personally (independently) visualized and performed the interpretation of the images attached in this note.    CLINICAL DATA:  Headache and left flank pain for a few days.  EXAM: CT ABDOMEN AND PELVIS WITHOUT CONTRAST  TECHNIQUE: Multidetector CT imaging of the abdomen and pelvis was performed following the standard protocol without IV contrast.  COMPARISON:  Plain films chest and ribs 07/26/2018.  FINDINGS: Lower chest: There's some linear scar in the lung bases. No pleural or pericardial effusion. Heart size is upper normal.  Hepatobiliary: Several tiny stones are seen layering dependently within the gallbladder but there is no evidence of cholecystitis. The liver and biliary tree are unremarkable.  Pancreas: Unremarkable. No pancreatic ductal dilatation or surrounding inflammatory changes.  Spleen: Normal in size without focal abnormality.  Adrenals/Urinary Tract: Adrenal glands are unremarkable. Kidneys are normal, without renal calculi or hydronephrosis. 1.6 cm simple cyst lower pole right kidney noted. Bladder is unremarkable.  Stomach/Bowel: A few sigmoid diverticula are seen. The colon is otherwise unremarkable. The stomach, small bowel and appendix appear normal.  Vascular/Lymphatic: Aortic atherosclerosis. No enlarged abdominal or pelvic lymph nodes.  Reproductive: Uterus and bilateral adnexa are unremarkable.  Other: None.  Musculoskeletal: The patient has a superior endplate compression fracture of L1 with vertebral body height loss of approximately 40%. No notable bony retropulsion. The fracture cannot be definitively characterized but it appears acute or early subacute. It is not seen on the prior plain film of the chest and  ribs.  IMPRESSION: Negative for urinary tract stone.  L1 superior endplate compression fracture with vertebral body height loss of approximately 40% cannot be definitively characterized but appears acute or subacute. It is new since plain films of the chest 07/26/2018.  Gallstones without evidence of cholecystitis.  Atherosclerosis.  Mild sigmoid diverticulosis without diverticulitis.   Electronically Signed   By: Drusilla Kannerhomas  Dalessio M.D.   On: 10/30/2018 16:11 I personally (independently) visualized and performed the interpretation of the images attached in this note.     Assessment and Plan: 67 y.o. female with L1 compression fracture.  Doing quite well.  Plan to continue current regimen of LSO brace.  Recheck in 1 month.  Return sooner if needed.  Tachycardia palpitation.  Currently well with unchanged EKG.  Patient has had work-up for this issue previously.  Patient does have some room in her blood pressure to increase her dose of metoprolol.  Reasonable to go back to 25 mg twice daily.  Recommend follow-up with PCP.    Historical information moved to improve visibility of documentation.  Past Medical History:  Diagnosis Date  . Anal fistula   . Atrial fibrillation (HCC)   . Dyslipidemia   . Hearing loss    75%  . Hypertension   . IBS (irritable bowel syndrome)    remote history  . Interstitial cystitis   . Kidney stone   . Menopausal state   . MVP (mitral valve prolapse)   . Paroxysmal A-fib (HCC)    status post ablation  . Swallowing difficulty    problems for second/need dentures   Past Surgical History:  Procedure Laterality Date  . CARDIAC ELECTROPHYSIOLOGY MAPPING AND ABLATION     for A fib  . CESAREAN SECTION  1971, 1973   x 2  . NASAL SEPTUM SURGERY  1977  . pulmonary vein isolation  06-2006   procedure for AFIB /[facility]   Social History   Tobacco Use  . Smoking status: Never Smoker  . Smokeless tobacco: Never Used  Substance  Use Topics  . Alcohol use: No   family history includes Diabetes in her maternal grandmother; Heart attack in her father; Heart disease in her brother and sister; Hypertension in her mother; Stroke in her paternal aunt.  Medications: Current Outpatient Medications  Medication Sig Dispense Refill  . ALPRAZolam (XANAX) 0.25 MG tablet Take 1 tablet (0.25 mg total) by mouth daily as needed for anxiety. 12 tablet 0  . aspirin 81 MG chewable tablet Chew 81 mg by mouth daily.     Marland Kitchen. CALCIUM PO Take 1 tablet by mouth daily.    . diclofenac sodium (VOLTAREN) 1 % GEL Apply 2 g topically 4 (four) times daily. 100 g 1  . fluconazole (DIFLUCAN) 100 MG tablet Take 1 tablet (100 mg total) by mouth daily. 7 tablet 0  . metoprolol tartrate (LOPRESSOR) 25 MG tablet Take 1 tablet (25 mg total) by mouth 2 (two) times daily. 180 tablet 3  . Multiple Vitamin (MULTIVITAMIN) capsule Take by mouth.     No current facility-administered medications for this visit.    Allergies  Allergen Reactions  . Amoxicillin Rash and Other (See Comments)  . Ampicillin   .  Azithromycin     REACTION: SOB  . Cephalexin   . Clarithromycin   . Codeine   . Flecainide   . Penicillins     REACTION: rash  . Shellfish Allergy     Sick to stomach   . Shellfish-Derived Products Hives and Nausea And Vomiting      Discussed warning signs or symptoms. Please see discharge instructions. Patient expresses understanding.

## 2018-11-17 ENCOUNTER — Telehealth: Payer: Self-pay | Admitting: Cardiology

## 2018-11-17 DIAGNOSIS — R002 Palpitations: Secondary | ICD-10-CM

## 2018-11-17 NOTE — Telephone Encounter (Signed)
Spoke with pt, she has been having palpitations more frequently. She is having a lot of health issues recently and that maybe contributing to her palpitations. She has had a couple EKG's and was told they were okay, but she would like dr [place]renshaw [place] When she was seen yesterday it was recommended she increase the metoprolol to 25 mg twice daily. She was afraid to do that but she reports her bp is 145/70 and her pulse was 113 this morning. She will recheck her bp now and if still elevated she will increase the metoprolol. She would also like to get the monitor that was mentioned at her last ov. Appointment made for event monitor at the [address] office. She has not taken her xanax because of other medications she is taking and wanting to make sure she did not have side effects. Encouraged patient to use the xanax as needed as she is anxious with recent health changes. If the increase in the metoprolol helps with the palpitations she may call and cancel the monitor. She would like dr Jens Somcrenshaw to review all her recent ER visits. Will forward for dr Jens Somcrenshaw review

## 2018-11-17 NOTE — Telephone Encounter (Signed)
.  Patient c/o Palpitations:  High priority if patient c/o lightheadedness, shortness of breath, or chest pain  1) How long have you had palpitations/irregular HR/ Afib? Are you having the symptoms now? Started on 10-30-18 and [facility] [place] [facility] [place] [facility], also went to Urgent Care on 11-13-18- still having them 2) Are you currently experiencing lightheadedness, SOB or CP?  no  3) Do you have a history of afib (atrial fibrillation) or irregular heart rhythm? yes  4) Have you checked your BP or HR? (document readings if available): blood pressure been up some  5) Are you experiencing any other symptoms? no

## 2018-11-18 NOTE — Telephone Encounter (Signed)
ECGs show sinus; agree with plan Olga MillersBrian Keta [place]

## 2018-11-20 NOTE — Telephone Encounter (Signed)
Left message for patient of dr crenshaw's recommendations. 

## 2018-11-28 NOTE — Addendum Note (Signed)
Addended by: Mallie Snooks R on: 11/28/2018 02:21 PM   Modules accepted: Orders

## 2018-11-29 ENCOUNTER — Ambulatory Visit (INDEPENDENT_AMBULATORY_CARE_PROVIDER_SITE_OTHER): Payer: Medicare Other

## 2018-11-29 ENCOUNTER — Telehealth: Payer: Self-pay

## 2018-11-29 DIAGNOSIS — R002 Palpitations: Secondary | ICD-10-CM | POA: Diagnosis not present

## 2018-11-29 NOTE — Telephone Encounter (Signed)
Pt came in for her monitor today ordered by Dr. Jens Som and she wanted to talk with a nurse re: a bad night that she has last night... she reports that she felt her heart racing all night and was checking her HR and BP multiple times throughout the night.. her BP was 156/79, 152/ 77, 169/78...Marland Kitchenher HR was fluctuating from 70-150 throughout there night... pt appeared very anxious and was talking about being very concerned about the palms of her hands and her ear lobes changing colors from red to white and blue and she said they were doing it again when she was talking with me but I did not assess those changes. She also says that she had some left sided chest tightness last night and I offered to have EMS or the friend that drove her here to take her to the ER but she declined.. she says that she is currently dealing with a spinal fracture of unknown cause and just getting over [place]... She was asking about the monitor she was getting today and I explained to her that if she is having rhythm problems.. we will be alerted and Dr. Jens Som will be abl;e to assess her HR and to keep a diary of when she feels bad and what her VS are... I also advised her to not take her BP and HR so often,, I instructed her to take it an hour or so after taking her meds and to rest for about 30 minutes before checking it and same with her evening dose... she verbalized understanding but I felt she was extremely anxious.. I reassured her that the monitor will give [place] good info for her future care.. Pt is now in having her monitor put on and will follow up with her to be sure she is feeling well and answer any more questions she may have.

## 2018-12-01 ENCOUNTER — Telehealth: Payer: Self-pay

## 2018-12-01 ENCOUNTER — Telehealth: Payer: Self-pay | Admitting: Cardiology

## 2018-12-01 MED ORDER — HYDROCORTISONE ACETATE 25 MG RE SUPP: 25.0000 mg | Freq: Two times a day (BID) | RECTAL | 0 refills | Status: DC

## 2018-12-01 NOTE — Telephone Encounter (Signed)
Left message advising patient

## 2018-12-01 NOTE — Telephone Encounter (Signed)
rx sent

## 2018-12-01 NOTE — Telephone Encounter (Signed)
Stephanie Rollins states her hemorrhoids have flared up. She has done sitz bath without relief. She want to know if Dr Linford ArnoldMetheney would prescribe a suppository. Please advise.

## 2018-12-01 NOTE — Telephone Encounter (Signed)
Patient of Dr. Duke Salvia who has 30 day event monitor placed 11/29/2018 for palpitations. She has h/o PAF.   On 11/30/2018 @ 9:40pm patient had episode of SR with 7 beat run of VT/PACs - HR 165.6 bpm. Follow up report was HR 79.8bpm. This was an auto-trigger event  This was reviewed by Dr. Allyson Sabal (DOD) who advised no changes. Report given to Stanton Kidney RN for review by primary cardiologist upon his return to the office.

## 2018-12-04 NOTE — Telephone Encounter (Signed)
Continue metoprolol; continue monitor to completion Olga Millers

## 2018-12-19 ENCOUNTER — Ambulatory Visit (INDEPENDENT_AMBULATORY_CARE_PROVIDER_SITE_OTHER): Payer: Medicare Other

## 2018-12-19 ENCOUNTER — Encounter: Payer: Self-pay | Admitting: Family Medicine

## 2018-12-19 ENCOUNTER — Ambulatory Visit (INDEPENDENT_AMBULATORY_CARE_PROVIDER_SITE_OTHER): Payer: Medicare Other | Admitting: Family Medicine

## 2018-12-19 VITALS — BP 115/64 | HR 75 | Wt 103.0 lb

## 2018-12-19 DIAGNOSIS — M4856XD Collapsed vertebra, not elsewhere classified, lumbar region, subsequent encounter for fracture with routine healing: Secondary | ICD-10-CM | POA: Diagnosis not present

## 2018-12-19 DIAGNOSIS — S32050A Wedge compression fracture of fifth lumbar vertebra, initial encounter for closed fracture: Secondary | ICD-10-CM | POA: Diagnosis not present

## 2018-12-19 DIAGNOSIS — M48062 Spinal stenosis, lumbar region with neurogenic claudication: Secondary | ICD-10-CM | POA: Diagnosis not present

## 2018-12-19 DIAGNOSIS — M4854XD Collapsed vertebra, not elsewhere classified, thoracic region, subsequent encounter for fracture with routine healing: Secondary | ICD-10-CM | POA: Diagnosis not present

## 2018-12-19 DIAGNOSIS — M5441 Lumbago with sciatica, right side: Secondary | ICD-10-CM

## 2018-12-19 DIAGNOSIS — R262 Difficulty in walking, not elsewhere classified: Secondary | ICD-10-CM

## 2018-12-19 DIAGNOSIS — M5442 Lumbago with sciatica, left side: Secondary | ICD-10-CM

## 2018-12-19 DIAGNOSIS — S32000A Wedge compression fracture of unspecified lumbar vertebra, initial encounter for closed fracture: Secondary | ICD-10-CM | POA: Diagnosis not present

## 2018-12-19 MED ORDER — AMBULATORY NON FORMULARY MEDICATION: 0 refills | Status: DC

## 2018-12-19 NOTE — Progress Notes (Signed)
Stephanie Rollins is a 68 y.o. female who presents to [facility] today for follow-up L1 vertebral compression fracture and discuss new onset low back pain radiating to legs bilaterally.  Stephanie Rollins has been seen several times for new L1 vertebral compression fracture originally noted on CT scan December 10.  She was treated conservatively with LSO bracing and did quite well until January 8 or 9.  She denies any falls but notes that she developed worsening sacral pain.  She notes pain in her lower sacrum and low back that is been worsening steadily over the last week or so.  She also notes pain radiating down her legs bilaterally to the lateral calves and foot.  She denies any bowel or bladder dysfunction or notable weakness or numbness.  She denies any falls or injuries.  She notes pain is worse with walking and extension.  She has improved pain with a bit of flexion or laying down.  She has been taking over-the-counter medications for pain which have helped a bit.  She notes that she does not have a lot of pain in her mid back where she was having pain with the L1 compression fracture.  She notes that the LSO brace that she had been wearing is not very comfortable.    ROS:  As above  Exam:  BP 115/64   Pulse 75   Wt 103 lb (46.7 kg)   BMI 18.25 kg/m  Wt Readings from Last 5 Encounters:  12/19/18 103 lb (46.7 kg)  11/16/18 109 lb 9.6 oz (49.7 kg)  11/13/18 117 lb 15.1 oz (53.5 kg)  10/31/18 118 lb (53.5 kg)  10/30/18 112 lb (50.8 kg)   General: Well Developed, well nourished, and in no acute distress.  Neuro/Psych: Alert and oriented x3, extra-ocular muscles intact, able to move all 4 extremities, sensation grossly intact. Skin: Warm and dry, no rashes noted.  Respiratory: Not using accessory muscles, speaking in full sentences, trachea midline.  Cardiovascular: Pulses palpable, no extremity edema. Abdomen: Does not appear distended. MSK:    L-spine: Nontender to midline at L1 area.  Not particularly tender to midline at L5 or throughout the lumbar spine.  Tender palpation bilateral lumbar paraspinal musculature.  Range of motion L spine significantly limited in extension.  Intact flexion.  Limited rotation and lateral flexion. Lower extremity strength is intact with the exception of foot dorsiflexion which is 4/5 bilaterally.  Otherwise strength is intact throughout. Reflexes are intact throughout.  Sensation is intact throughout. Antalgic gait present.    Lab and Radiology Results X-ray L-spine images personally independently reviewed. Concerning for new L5 compression fracture compared to x-ray dated November 16, 2018. No new changes otherwise visible Spiriva read.  Degenerative changes throughout.  Stable mild scoliosis present throughout. Diffuse osteopenia appearance as well present. Await formal radiology review.    Assessment and Plan: 68 y.o. female with new low back pain in the setting of likely L5 radiculopathy bilaterally with per my read new L5 compression fracture.  This is concerning for neurogenic claudication and spinal stenosis.  Plan for LSO bracing, walker as needed, and pain control.  However most importantly we will proceed with lumbar MRI.  If symptoms worsen well get lumbar MRI stat otherwise if she not dramatically worsening I feel it safe to wait till Saturday.  However if worsening weakness will proceed sooner.  I think it is reasonable to wait till Saturday because transportation is a significant difficulty for her and  her symptoms been ongoing for about 20 days now without dramatic worsening.  Think she is unlikely to have significant symptom progression the next few days however she does proceed with emergency MRI.  Follow-up after MRI.   PDMP not reviewed this encounter. Orders Placed This Encounter  Procedures  . DG Lumbar Spine Complete    Standing Status:   Future    Number of Occurrences:    1    Standing Expiration Date:   02/17/2020    Order Specific Question:   Reason for Exam (SYMPTOM  OR DIAGNOSIS REQUIRED)    Answer:   new onset back pain. Likely new L5 radicuopathy BL. ? Neurogenic claudication    Order Specific Question:   Preferred imaging location?    [facility]    Order Specific Question:   Radiology Contrast Protocol - do NOT remove file path    Answer:   \\[email or web address]  . MR Lumbar Spine Wo Contrast    Standing Status:   Future    Standing Expiration Date:   02/17/2020    Order Specific Question:   ** REASON FOR EXAM (FREE TEXT)    Answer:   Likely new L5 compression fx with BL L5 weakness and radicular pain    Order Specific Question:   What is the patient's sedation requirement?    Answer:   No Sedation    Order Specific Question:   Does the patient have a pacemaker or implanted devices?    Answer:   No    Order Specific Question:   Preferred imaging [facility]?    [facility] (table limit 350lbs)    Order Specific Question:   Radiology Contrast Protocol - do NOT remove file path    Answer:   \\[email or web address]   Meds ordered this encounter  Medications  . AMBULATORY NON FORMULARY MEDICATION    Sig: Standard walker. Use for ambulation. R26.2  Disp 1    Dispense:  1 each    Refill:  0    Historical information moved to improve visibility of documentation.  Past Medical History:  Diagnosis Date  . Anal fistula   . Atrial fibrillation (HCC)   . Dyslipidemia   . Hearing loss    75%  . Hypertension   . IBS (irritable bowel syndrome)    remote history  . Interstitial cystitis   . Kidney stone   . Menopausal state   . MVP (mitral valve prolapse)   . Paroxysmal A-fib (HCC)    status post ablation  . Swallowing difficulty    problems for second/need dentures   Past Surgical History:  Procedure Laterality Date  . CARDIAC ELECTROPHYSIOLOGY  MAPPING AND ABLATION     for A fib  . CESAREAN SECTION  1971, 1973   x 2  . NASAL SEPTUM SURGERY  1977  . pulmonary vein isolation  06-2006   procedure for AFIB /[facility]   Social History   Tobacco Use  . Smoking status: Never Smoker  . Smokeless tobacco: Never Used  Substance Use Topics  . Alcohol use: No   family history includes Diabetes in her maternal grandmother; Heart attack in her father; Heart disease in her brother and sister; Hypertension in her mother; Stroke in her paternal aunt.  Medications: Current Outpatient Medications  Medication Sig Dispense Refill  . ALPRAZolam (XANAX) 0.25 MG tablet Take 1 tablet (0.25 mg total) by mouth daily as needed for anxiety. 12 tablet 0  .  aspirin 81 MG chewable tablet Chew 81 mg by mouth daily.     Marland Kitchen CALCIUM PO Take 1 tablet by mouth daily.    . diclofenac sodium (VOLTAREN) 1 % GEL Apply 2 g topically 4 (four) times daily. 100 g 1  . fluconazole (DIFLUCAN) 100 MG tablet Take 1 tablet (100 mg total) by mouth daily. 7 tablet 0  . hydrocortisone (ANUSOL-HC) 25 MG suppository Place 1 suppository (25 mg total) rectally 2 (two) times daily. 24 suppository 0  . metoprolol tartrate (LOPRESSOR) 25 MG tablet Take 1 tablet (25 mg total) by mouth 2 (two) times daily. 180 tablet 3  . Multiple Vitamin (MULTIVITAMIN) capsule Take by mouth.    . AMBULATORY NON FORMULARY MEDICATION Standard walker. Use for ambulation. R26.2  Disp 1 1 each 0   No current facility-administered medications for this visit.    Allergies  Allergen Reactions  . Amoxicillin Rash and Other (See Comments)  . Ampicillin   . Azithromycin     REACTION: SOB  . Cephalexin   . Clarithromycin   . Codeine   . Flecainide   . Penicillins     REACTION: rash  . Shellfish Allergy     Sick to stomach   . Shellfish-Derived Products Hives and Nausea And Vomiting      Discussed warning signs or symptoms. Please see discharge instructions. Patient expresses  understanding.

## 2018-12-19 NOTE — Patient Instructions (Addendum)
Thank you for coming in today. You should hear about the MRI.  Try to use the back brace.  Use the walker.  Recheck after MRI.  Come back or go to the emergency room if you notice new weakness new numbness problems walking or bowel or bladder problems.   Spinal Stenosis  Spinal stenosis occurs when the open space (spinal canal) between the bones of your spine (vertebrae) narrows, putting pressure on the spinal cord or nerves. What are the causes? This condition is caused by areas of bone pushing into the central canals of your vertebrae. This condition may be present at birth (congenital), or it may be caused by:  Arthritic deterioration of your vertebrae (spinal degeneration). This usually starts around age 70.  Injury or trauma to the spine.  Tumors in the spine.  Calcium deposits in the spine. What are the signs or symptoms? Symptoms of this condition include:  Pain in the neck or back that is generally worse with activities, particularly when standing and walking.  Numbness, tingling, hot or cold sensations, weakness, or weariness in your legs.  Pain going up and down the leg (sciatica).  Frequent episodes of falling.  A foot-slapping gait that leads to muscle weakness. In more serious cases, you may develop:  Problemspassing stool or passing urine.  Difficulty having sex.  Loss of feeling in part or all of your leg. Symptoms may come on slowly and get worse over time. How is this diagnosed? This condition is diagnosed based on your medical history and a physical exam. Tests will also be done, such as:  MRI.  CT scan.  X-ray. How is this treated? Treatment for this condition often focuses on managing your pain and any other symptoms. Treatment may include:  Practicing good posture to lessen pressure on your nerves.  Exercising to strengthen muscles, build endurance, improve balance, and maintain good joint movement (range of motion).  Losing weight, if  needed.  Taking medicines to reduce swelling, inflammation, or pain.  Assistive devices, such as a corset or brace. In some cases, surgery may be needed. The most common procedure is decompression laminectomy. This is done to remove excess bone that puts pressure on your nerve roots. Follow these instructions at home: Managing pain, stiffness, and swelling  Do all exercises and stretches as told by your health care provider.  Practice good posture. If you were given a brace or a corset, wear it as told by your health care provider.  Do not do any activities that cause pain. Ask your health care provider what activities are safe for you.  Do not lift anything that is heavier than 10 lb (4.5 kg) or the limit that your health care provider tells you.  Maintain a healthy weight. Talk with your health care provider if you need help losing weight.  If directed, apply heat to the affected area as often as told by your health care provider. Use the heat source that your health care provider recommends, such as a moist heat pack or a heating pad. ? Place a towel between your skin and the heat source. ? Leave the heat on for 20-30 minutes. ? Remove the heat if your skin turns bright red. This is especially important if you are not able to feel pain, heat, or cold. You may have a greater risk of getting burned. General instructions  Take over-the-counter and prescription medicines only as told by your health care provider.  Do not use any products that contain  nicotine or tobacco, such as cigarettes and e-cigarettes. If you need help quitting, ask your health care provider.  Eat a healthy diet. This includes plenty of fruits and vegetables, whole grains, and low-fat (lean) protein.  Keep all follow-up visits as told by your health care provider. This is important. Contact a health care provider if:  Your symptoms do not get better or they get worse.  You have a fever. Get help right away  if:  You have new or worse pain in your neck or upper back.  You have severe pain that cannot be controlled with medicines.  You are dizzy.  You have vision problems, blurred vision, or double vision.  You have a severe headache that is worse when you stand.  You have nausea or you vomit.  You develop new or worse numbness or tingling in your back or legs.  You have pain, redness, swelling, or warmth in your arm or leg. Summary  Spinal stenosis occurs when the open space (spinal canal) between the bones of your spine (vertebrae) narrows. This narrowing puts pressure on the spinal cord or nerves.  Spinal stenosis can cause numbness, weakness, or pain in the neck, back, and legs.  This condition may be caused by a birth defect, arthritic deterioration of your vertebrae, injury, tumors, or calcium deposits.  This condition is usually diagnosed with MRIs, CT scans, and X-rays. This information is not intended to replace advice given to you by your health care provider. Make sure you discuss any questions you have with your health care provider. Document Released: 01/29/2004 Document Revised: 10/13/2016 Document Reviewed: 10/13/2016 Elsevier Interactive Patient Education  2019 [employer].

## 2018-12-22 ENCOUNTER — Telehealth: Payer: Self-pay | Admitting: Family Medicine

## 2018-12-22 ENCOUNTER — Telehealth: Payer: Self-pay

## 2018-12-22 MED ORDER — AMBULATORY NON FORMULARY MEDICATION: 0 refills | Status: DC

## 2018-12-22 NOTE — Telephone Encounter (Signed)
Maurice called to see if the order for a walker has been faxed to [facility]. They need an order and her insurance information.    Fax [phone]

## 2018-12-22 NOTE — Telephone Encounter (Signed)
[facility] tried to contact Pt three times to schedule MRI. (1/31 1/29, 1/30 lmom)  I also left VM for Pt to return call to get scheduled.

## 2018-12-22 NOTE — Telephone Encounter (Signed)
Attempted to contact Pt, no answer. Left VM to return clinic call.  

## 2018-12-22 NOTE — Telephone Encounter (Signed)
We will send order, copy of insurance card, and copy of demographics and last note to [facility] now.

## 2018-12-25 ENCOUNTER — Telehealth: Payer: Self-pay | Admitting: *Deleted

## 2018-12-25 NOTE — Telephone Encounter (Signed)
Spoke with pt, last night when she took her bp prior to taking the evening dose of metoprolol it was 97/49 hr 83. She became very anxious. Her bp went to 155/73 hr 100 and she was able to take the 25 mg of metoprolol. This morning her bp 130/64 hr 99. She is very anxious and has a lot of things going on with her back. She has compression fractures in her back and is having trouble walking. Reassurance given to the patient if something was wrong on the monitor they would have called us and everything has been fine. She is still anxious about her heart. Will discuss with dr Jens Somcrenshaw when to hold off on the metoprolol related to low bp. She will be finished with the monitor Thursday this week.

## 2018-12-27 NOTE — Telephone Encounter (Signed)
Left message for pt to call.

## 2018-12-30 ENCOUNTER — Ambulatory Visit (HOSPITAL_BASED_OUTPATIENT_CLINIC_OR_DEPARTMENT_OTHER)
Admission: RE | Admit: 2018-12-30 | Discharge: 2018-12-30 | Disposition: A | Discharge status: Home / Self Care | Payer: Medicare Other | Source: Ambulatory Visit | Attending: Family Medicine | Admitting: Family Medicine

## 2018-12-30 DIAGNOSIS — M545 Low back pain: Secondary | ICD-10-CM | POA: Diagnosis not present

## 2018-12-30 DIAGNOSIS — M5441 Lumbago with sciatica, right side: Secondary | ICD-10-CM

## 2018-12-30 DIAGNOSIS — M5442 Lumbago with sciatica, left side: Secondary | ICD-10-CM | POA: Diagnosis not present

## 2018-12-30 DIAGNOSIS — M48062 Spinal stenosis, lumbar region with neurogenic claudication: Secondary | ICD-10-CM | POA: Diagnosis not present

## 2018-12-30 DIAGNOSIS — S32000A Wedge compression fracture of unspecified lumbar vertebra, initial encounter for closed fracture: Secondary | ICD-10-CM | POA: Insufficient documentation

## 2019-01-02 ENCOUNTER — Ambulatory Visit: Payer: Medicare Other | Admitting: Family Medicine

## 2019-01-03 ENCOUNTER — Telehealth: Payer: Self-pay

## 2019-01-03 MED ORDER — AMBULATORY NON FORMULARY MEDICATION: 0 refills | Status: DC

## 2019-01-03 NOTE — Telephone Encounter (Signed)
Stephanie Rollins states she has dry mouth with a white patches. Denies fever, chills, sweats or jaw pain. She went to the [facility] in December and was diagnosed with Thrush by Dr Armond Hang and was treated with Diflucan once daily for 7 days. She would like a refill. Please advise.    She would like a prescription for a blood pressure cuff.

## 2019-01-03 NOTE — Telephone Encounter (Signed)
Orders printed.  Will send to [facility].

## 2019-01-03 NOTE — Telephone Encounter (Signed)
Left message for a return call

## 2019-01-03 NOTE — Telephone Encounter (Signed)
Rather do nystatin swish and swallow so that way it coats her mouth and then she swallows and it goes through the bloodstream and coats topically.  If she was okay with that then please send.  Okay to send order for blood pressure cuff.

## 2019-01-03 NOTE — Telephone Encounter (Signed)
Stephanie Rollins called and states she has trouble walking with the walker. She would like a Rollator. Also she requested a bedside commode. Pended orders. Needs to be faxed to [facility].

## 2019-01-03 NOTE — Telephone Encounter (Signed)
Left a message for a return call.

## 2019-01-04 MED ORDER — FLUCONAZOLE 100 MG PO TABS: 100.0000 mg | ORAL_TABLET | Freq: Every day | ORAL | 0 refills | Status: DC

## 2019-01-04 NOTE — Telephone Encounter (Signed)
Order has been faxed to [facility] and a confirmation has been received. Salih Williamson,CMA

## 2019-01-04 NOTE — Telephone Encounter (Signed)
Patient advised. She wants Dr Denyse Amass to call her if he gets a chance.

## 2019-01-04 NOTE — Telephone Encounter (Signed)
Fay states the diflucan is the only medication that helps. She requested I ask again for a prescription for her of the diflucan.

## 2019-01-04 NOTE — Telephone Encounter (Signed)
rx sent. If not better needs appt.

## 2019-01-05 NOTE — Telephone Encounter (Signed)
Called and left a message asking for call back. May need to schedule a time to talk.

## 2019-01-05 NOTE — Telephone Encounter (Signed)
Patient advised.

## 2019-01-05 NOTE — Telephone Encounter (Signed)
Patient scheduled an office visit to see Dr Denyse Amass.

## 2019-01-08 ENCOUNTER — Encounter: Payer: Self-pay | Admitting: Family Medicine

## 2019-01-08 ENCOUNTER — Ambulatory Visit (INDEPENDENT_AMBULATORY_CARE_PROVIDER_SITE_OTHER): Payer: Medicare Other | Admitting: Family Medicine

## 2019-01-08 VITALS — BP 112/71 | HR 83

## 2019-01-08 DIAGNOSIS — E43 Unspecified severe protein-calorie malnutrition: Secondary | ICD-10-CM | POA: Diagnosis not present

## 2019-01-08 DIAGNOSIS — S32050A Wedge compression fracture of fifth lumbar vertebra, initial encounter for closed fracture: Secondary | ICD-10-CM | POA: Diagnosis not present

## 2019-01-08 MED ORDER — AMBULATORY NON FORMULARY MEDICATION: 0 refills | Status: DC

## 2019-01-08 NOTE — Patient Instructions (Addendum)
Thank you for coming in today. Attend Physical therapy.  Work on getting calories especially protein.  Consider whey protein or chicken.    You should hear from neurosurgery.   Recheck with me in 3 weeks.   Return sooner if needed.   Use the back brace.     Spinal Compression Fracture  A spinal compression fracture is a collapse of the bones that form the spine (vertebrae). With this type of fracture, the vertebrae become pushed (compressed) into a wedge shape. Most compression fractures happen in the middle or lower part of the spine. What are the causes? This condition may be caused by:  Thinning and loss of density in the bones (osteoporosis). This is the most common cause.  A fall.  A car or motorcycle accident.  Cancer.  Trauma, such as a heavy, direct hit to the head or back. What increases the risk? You are more likely to develop this condition if:  You are 23 years or older.  You have osteoporosis.  You have certain types of cancer, including: ? Multiple myeloma. ? Lymphoma. ? Prostate cancer. ? Lung cancer. ? Breast cancer. What are the signs or symptoms? Symptoms of this condition include:  Severe pain.  Pain that gets worse over time.  Pain that is worse when you stand, walk, sit, or bend.  Sudden pain that is so bad that it is hard for you to move.  Bending or humping of the spine.  Gradual loss of height.  Numbness, tingling, or weakness in the back and legs.  Trouble walking. Your symptoms will depend on the cause of the fracture and how quickly it develops. How is this diagnosed? This condition may be diagnosed based on symptoms, medical history, and a physical exam. During the physical exam, your health care provider may tap along the length of your spine to check for tenderness. Tests may be done to confirm the diagnosis. They may include:  A bone mineral density test to check for osteoporosis.  Imaging tests, such as a spine X-ray,  CT scan, or MRI. How is this treated? Treatment for this condition depends on the cause and severity of the condition. Some fractures may heal on their own with supportive care. Treatment may include:  Pain medicine.  Rest.  A back brace.  Physical therapy exercises.  Medicine to strengthen bone.  Calcium and vitamin D supplements. Fractures that cause the back to become misshapen, cause nerve pain or weakness, or do not respond to other treatment may be treated with surgery. This may include:  Vertebroplasty. Bone cement is injected into the collapsed vertebrae to stabilize them.  Balloon kyphoplasty. The collapsed vertebrae are expanded with a balloon and then bone cement is injected into them.  Spinal fusion. The collapsed vertebrae are connected (fused) to normal vertebrae. Follow these instructions at home: Medicines  Take over-the-counter and prescription medicines only as told by your health care provider.  Do not drive or operate heavy machinery while taking prescription pain medicine.  If you are taking prescription pain medicine, take actions to prevent or treat constipation. Your health care provider may recommend that you: ? Drink enough fluid to keep your urine pale yellow. ? Eat foods that are high in fiber, such as fresh fruits and vegetables, whole grains, and beans. ? Limit foods that are high in fat and processed sugars, such as fried or sweet foods. ? Take an over-the-counter or prescription medicine for constipation. If you have a brace:  Wear the  brace as told by your health care provider. Remove it only as told by your health care provider.  Loosen the brace if your fingers or toes tingle, become numb, or turn cold and blue.  Keep the brace clean.  If the brace is not waterproof: ? Do not let it get wet. ? Cover it with a watertight covering when you take a bath or a shower. Managing pain, stiffness, and swelling   If directed, apply ice to the  injured area: ? If you have a removable brace, remove it as told by your health care provider. ? Put ice in a plastic bag. ? Place a towel between your skin and the bag. ? Leave the ice on for 30 minutes every two hours at first. Then apply the ice as needed. Activity  Rest as told by your health care provider. ? Avoid sitting for a long time without moving. Get up to take short walks every 1-2 hours. This is important to improve blood flow and breathing. Ask for help if you feel weak or unsteady.  Return to your normal activities as directed by your health care provider. Ask what activities are safe for you.  Do exercises to improve motion and strength in your back (physical therapy), as recommended by your health care provider.  Exercise regularly as directed by your health care provider. General instructions   Do not drink alcohol. Alcohol can interfere with your treatment.  Do not use any products that contain nicotine or tobacco, such as cigarettes and e-cigarettes. These can delay bone healing. If you need help quitting, ask your health care provider.  Keep all follow-up visits as told by your health care provider. This is important. It can help to prevent permanent injury, disability, and long-lasting (chronic) pain. Contact a health care provider if:  You have a fever.  You develop a cough that makes your pain worse.  Your pain medicine is not helping.  Your pain does not get better over time.  You cannot return to your normal activities as planned or expected. Get help right away if:  Your pain is very bad and it suddenly gets worse.  You are unable to move any body part (paralysis) that is below the level of your injury.  You have numbness, tingling, or weakness in any body part that is below the level of your injury.  You cannot control your bladder or bowels. Summary  A spinal compression fracture is a collapse of the bones that form the spine  (vertebrae).  With this type of fracture, the vertebrae become pushed (compressed) into a wedge shape.  Your symptoms and treatment will depend on the cause and severity of the fracture and how quickly it develops.  Some fractures may heal on their own with supportive care. Fractures that cause the back to become misshapen, cause nerve pain or weakness, or do not respond to other treatment may be treated with surgery. This information is not intended to replace advice given to you by your health care provider. Make sure you discuss any questions you have with your health care provider. Document Released: 11/08/2005 Document Revised: 12/20/2017 Document Reviewed: 12/20/2017 Elsevier Interactive Patient Education  2019 [place] Malnutrition Protein-energy malnutrition is when a person does not eat enough protein, fat, and calories. When this happens over time, it can lead to severe loss of muscle tissue (muscle wasting). This condition also affects the body's defense system (immune system) and can lead to other  health problems. What are the causes? This condition may be caused by:  Not eating enough protein, fat, or calories.  Having certain chronic medical conditions.  Eating too little. What increases the risk? The following factors may make you more likely to develop this condition:  Living in poverty.  Long-term hospitalization.  Alcohol or drug dependency. Addiction often leads to a lifestyle in which proper diet is ignored. Dependency can also hurt the metabolism and the body's ability to absorb nutrients.  Eating disorders, such as anorexia nervosa or bulimia.  Chewing or swallowing problems. People with these disorders may not eat enough.  Having certain conditions, such as: ? Inflammatory bowel disease. Inflammation of the intestines makes it difficult for the body to absorb nutrients. ? Cancer or AIDS. These diseases can cause a loss of  appetite. ? Chronic heart failure. This interferes with how the body uses nutrients. ? Cystic fibrosis. This disease can make it difficult for the body to absorb nutrients.  Eating a diet that extremely restricts protein, fat, or calorie intake. What are the signs or symptoms? Symptoms of this condition include:  Fatigue.  Weakness.  Dizziness.  Fainting.  Weight loss.  Loss of muscle tone and muscle mass.  Poor immune response.  Lack of menstruation.  Poor memory.  Hair loss.  Skin changes. How is this diagnosed? This condition may be diagnosed based on:  Your medical and dietary history.  A physical exam. This may include a measurement of your body mass index (BMI).  Blood tests. How is this treated? This condition may be managed with:  Nutrition therapy. This may include working with a diet and nutrition specialist (dietitian).  Treatment for underlying conditions. People with severe protein-energy malnutrition may need to be treated in a hospital. This may involve receiving nutrition and fluids through an IV. Follow these instructions at home:   Eat a balanced diet. In each meal, include at least one food that is high in protein. Foods that are high in protein include: ? Meat. ? Poultry. ? Fish. ? Eggs. ? Cheese. ? Milk. ? Beans. ? Nuts.  Eat nutrient-rich foods that are easy to swallow and digest, such as: ? Fruit and yogurt smoothies. ? Oatmeal with nut butter.  Try to eat six small meals each day instead of three large meals.  Take vitamin and protein supplements as told by your health care provider or dietitian.  Follow your health care provider's recommendations about exercise and activity.  Keep all follow-up visits as told by your health care provider. This is important. Contact a health care provider if you:  Have increased weakness or fatigue.  Faint.  Are a woman and you stop having your period (menstruating).  Have rapid hair  loss.  Have unexpected weight loss.  Have diarrhea.  Have nausea and vomiting. Get help right away if you have:  Difficulty breathing.  Chest pain. Summary  Protein-energy malnutrition is when a person does not eat enough protein, fat, and calories.  Protein-energy malnutrition can lead to severe loss of muscle tissue (muscle wasting). This condition also affects the body's defense system (immune system) and can lead to other health problems.  Talk with your health care provider about treatment for this condition. Effective treatment depends on the underlying cause of the malnutrition. This information is not intended to replace advice given to you by your health care provider. Make sure you discuss any questions you have with your health care provider. Document Released: 09/24/2005 Document Revised: 11/23/2017  Document Reviewed: 11/23/2017 Elsevier Interactive Patient Education  [employer].

## 2019-01-08 NOTE — Progress Notes (Signed)
Stephanie Rollins is a 68 y.o. female who presents to [facility] today for follow-up back pain.  Stephanie Rollins has been seen several times for significant back pain.  She initially had a vertebral compression fracture at L1.  She had worsening pain in her lower back near her sacrum and subsequent x-ray and MRI showed new compression fracture at L5 with some posterior bony retropulsion.  She did have some lower extremity radicular pain as well.  MRI was obtained on the eighth and she is here today to follow-up the results.  In the interim she notes that she continues to have some pain in her low back.  The low back pain is improving.  However she continues to have some pain radiating down her legs as well as numbness in her feet.  She denies any new weakness.  She notes that she is able to ambulate using a walker.  She denies any bowel or bladder dysfunction as well.  No fevers or chills nausea vomiting or diarrhea.  She notes that she is has trouble eating enough calories.  She has many food restrictions or foods that she does not tolerate.  She eats a diet that is pretty low and protein and calories including oatmeal and beans.     ROS:  As above  Exam:  BP 112/71   Pulse 83  Wt Readings from Last 5 Encounters:  12/19/18 103 lb (46.7 kg)  11/16/18 109 lb 9.6 oz (49.7 kg)  11/13/18 117 lb 15.1 oz (53.5 kg)  10/31/18 118 lb (53.5 kg)  10/30/18 112 lb (50.8 kg)   General: Well Developed, well nourished, and in no acute distress.  Neuro/Psych: Alert and oriented x3, extra-ocular muscles intact, able to move all 4 extremities, sensation grossly intact. Skin: Warm and dry, no rashes noted.  Respiratory: Not using accessory muscles, speaking in full sentences, trachea midline.  Cardiovascular: Pulses palpable, no extremity edema. Abdomen: Does not appear distended. MSK:  L-spine: Nontender to spinal midline.  Lumbar motion limited. Lower extremity  strength intact hip flexion abduction and extension. Knee flexion 4+/5 bilaterally. Knee extension 4+/5 bilaterally. Foot dorsiflexion 4/5 right and 4/5 left.   Great toe dorsiflexion 4/5 right and 4/5 left. Foot and great toe plantar flexion 5/5 right, and 5/5 left Sensation is intact throughout for the exception of the dorsal feet bilaterally which is slightly diminished. Reflexes intact bilateral knees and Achilles tendons.     Lab and Radiology Results EXAM: MRI LUMBAR SPINE WITHOUT CONTRAST  TECHNIQUE: Multiplanar, multisequence MR imaging of the lumbar spine was performed. No intravenous contrast was administered.  COMPARISON:  Radiograph 12/19/2018  FINDINGS: Despite efforts by the technologist and patient, motion artifact is present on today's exam and could not be eliminated. This reduces exam sensitivity and specificity.  Segmentation: The lowest lumbar type non-rib-bearing vertebra is labeled as L5.  Alignment:  No vertebral subluxation is observed.  Vertebrae: 50% superior endplate compression fracture at L5 with vertebral and paravertebral edema. 2 mm posterior bony retropulsion along the posterosuperior endplate.  There is facet and perifacet edema at the L4-5 level which is somewhat indistinct due to the motion artifact. Am skeptical that there are fractures extending into the facet joints or pedicles, although the degree of edema makes this not entirely certain.  40% superior endplate compression fracture at T12, with a small amount of vertebral edema suggesting a late subacute compression. Similarly, there is a 50% compression at L1 with subtle endplate edema  along the superior endplate. This may likewise be late subacute.  Mild perifacet edema at the L5-S1 level. Subtle marrow edema in the upper portion of the L4 superior endplate.  Conus medullaris and cauda equina: Conus extends to the L1 level. Conus and cauda equina appear  normal.  Paraspinal and other soft tissues: Dependent small gallstones in the gallbladder. A fluid signal intensity lesion of the right kidney is partially included on today's exam and statistically likely to represent a cyst although technically nonspecific.  Disc levels:  T11-12: No impingement, mild disc bulge and mild posterior bony retropulsion.  T12-L1: No impingement, mild disc bulge.  L1-2: No impingement.  Mild right paracentral disc protrusion.  L2-3: No impingement.  Mild disc bulge.  L3-4: Mild right foraminal stenosis and borderline bilateral subarticular lateral recess stenosis along with borderline central narrowing of the thecal sac due to disc bulge, intervertebral spurring, facet arthropathy.  L4-5: There is prominent central narrowing of the thecal sac and mild bilateral foraminal stenosis along with prominent left and moderate right subarticular lateral recess stenosis due to disc bulge, bony retropulsion, and facet arthropathy. The cross-sectional area of the thecal sac is narrowed to 0.4 cm^2.  L5-S1: There is prominent central narrowing of the thecal sac along with mild bilateral foraminal stenosis and at least moderate bilateral subarticular lateral recess stenosis due to facet arthropathy, cross-sectional area of the thecal sac is approximately 0.3 cm^2.  IMPRESSION: 1. 50% acute or subacute compression fracture at L5 with 2 mm of posterior bony retropulsion. 2. Late subacute compression fractures at T12 and L1. 3. Prominent impingement at L4-5 and L5-S1 and mild impingement at L3-4 primarily from spondylosis and degenerative disc disease, although mild posterior bony retropulsion may play a small role in the pinch mint at L4-5.   Electronically Signed   By: Gaylyn Rong M.D.   On: 12/30/2018 16:21 I personally (independently) visualized and performed the interpretation of the images attached in this  note.      Assessment and Plan: 68 y.o. female with  Back pain and lumbar radicular pain due to new L5 compression fracture.  This is likely osteoporotic in nature. Plan to continue back brace which has been helpful.  Her lumbar  radicular pain has not worsened and her weakness is also not worsened.  She notes that she would like to avoid injections if possible.  Plan to proceed with physical therapy as well as LSO bracing.  Additionally patient has severe protein calorie malnutrition.  This likely is delaying or impeding her improvement.  Discussed nutrition strategies as well.  Went to refer to physical therapy.  Additionally will refer to neurosurgery for second opinion/consultation.  She is not a good surgical candidate given her overall poor health and likely very osteoporotic spine.  However she does have persistent weakness.   PDMP not reviewed this encounter. Orders Placed This Encounter  Procedures  . Ambulatory referral to Physical Therapy    Referral Priority:   Routine    Referral Type:   Physical Medicine    Referral Reason:   Specialty Services Required    Requested Specialty:   Physical Therapy  . Ambulatory referral to Neurosurgery    Referral Priority:   Routine    Referral Type:   Surgical    Referral Reason:   Specialty Services Required    Requested Specialty:   Neurosurgery    Number of Visits Requested:   1   Meds ordered this encounter  Medications  . AMBULATORY  NON FORMULARY MEDICATION    Sig: Wheelchair. R26.2  Disp 1    Dispense:  1 each    Refill:  0    Historical information moved to improve visibility of documentation.  Past Medical History:  Diagnosis Date  . Anal fistula   . Atrial fibrillation (HCC)   . Dyslipidemia   . Hearing loss    75%  . Hypertension   . IBS (irritable bowel syndrome)    remote history  . Interstitial cystitis   . Kidney stone   . Menopausal state   . MVP (mitral valve prolapse)   . Paroxysmal A-fib (HCC)     status post ablation  . Swallowing difficulty    problems for second/need dentures   Past Surgical History:  Procedure Laterality Date  . CARDIAC ELECTROPHYSIOLOGY MAPPING AND ABLATION     for A fib  . CESAREAN SECTION  1971, 1973   x 2  . NASAL SEPTUM SURGERY  1977  . pulmonary vein isolation  06-2006   procedure for AFIB /[facility]   Social History   Tobacco Use  . Smoking status: Never Smoker  . Smokeless tobacco: Never Used  Substance Use Topics  . Alcohol use: No   family history includes Diabetes in her maternal grandmother; Heart attack in her father; Heart disease in her brother and sister; Hypertension in her mother; Stroke in her paternal aunt.  Medications: Current Outpatient Medications  Medication Sig Dispense Refill  . ALPRAZolam (XANAX) 0.25 MG tablet Take 1 tablet (0.25 mg total) by mouth daily as needed for anxiety. 12 tablet 0  . AMBULATORY NON FORMULARY MEDICATION Bedside commode. 1 each 0  . AMBULATORY NON FORMULARY MEDICATION Wheelchair. R26.2  Disp 1 1 each 0  . aspirin 81 MG chewable tablet Chew 81 mg by mouth daily.     Marland Kitchen CALCIUM PO Take 1 tablet by mouth daily.    . diclofenac sodium (VOLTAREN) 1 % GEL Apply 2 g topically 4 (four) times daily. 100 g 1  . fluconazole (DIFLUCAN) 100 MG tablet Take 1 tablet (100 mg total) by mouth daily. 7 tablet 0  . hydrocortisone (ANUSOL-HC) 25 MG suppository Place 1 suppository (25 mg total) rectally 2 (two) times daily. 24 suppository 0  . metoprolol tartrate (LOPRESSOR) 25 MG tablet Take 1 tablet (25 mg total) by mouth 2 (two) times daily. 180 tablet 3  . Multiple Vitamin (MULTIVITAMIN) capsule Take by mouth.     No current facility-administered medications for this visit.    Allergies  Allergen Reactions  . Amoxicillin Rash and Other (See Comments)  . Ampicillin   . Azithromycin     REACTION: SOB  . Cephalexin   . Clarithromycin   . Codeine   . Flecainide   . Penicillins     REACTION: rash  .  Shellfish Allergy     Sick to stomach   . Shellfish-Derived Products Hives and Nausea And Vomiting      Discussed warning signs or symptoms. Please see discharge instructions. Patient expresses understanding.

## 2019-01-10 ENCOUNTER — Telehealth: Payer: Self-pay

## 2019-01-10 NOTE — Telephone Encounter (Signed)
Stephanie Rollins called for a Clinical cytogeneticist. Please send to a neuro in [place].

## 2019-01-10 NOTE — Telephone Encounter (Signed)
Done - CF °

## 2019-01-12 ENCOUNTER — Telehealth: Payer: Self-pay

## 2019-01-12 NOTE — Telephone Encounter (Signed)
Pt advised and stated that she has lost weight, and has not been eating a lot and eats what she can afford.   She wanted to know the amount of calories,protiens, ect,she should be eating or should she be placed in a rehab due to her issues w/her back. She stated that she had asked him about going into rehab and stated that she is so exhausted from trying to move around her home. Dr. Denyse Amass would like for her to have PT, but she feels that she would benefit from being in a facility. She reports it is hard for her to get out due to transportation issues.This is why she was reaching out to Dr. Linford Arnold. Laureen Ochs, Viann Shove, CMA

## 2019-01-12 NOTE — Telephone Encounter (Signed)
Stephanie Rollins called and asked to speak with Dr Linford Arnold. She states she just would like to speak with her about something Dr Denyse Amass mentioned. He mentioned she has malnutrition.

## 2019-01-12 NOTE — Telephone Encounter (Signed)
She is welcome to schedule a visit or we can discuss at ntext OV. I am not sure why he said that. Has she lost weight recently?

## 2019-01-17 ENCOUNTER — Other Ambulatory Visit: Payer: Self-pay | Admitting: *Deleted

## 2019-01-17 DIAGNOSIS — R002 Palpitations: Secondary | ICD-10-CM

## 2019-01-22 ENCOUNTER — Telehealth: Payer: Self-pay | Admitting: Cardiology

## 2019-01-22 NOTE — Telephone Encounter (Signed)
Left message for pt to call, monitor is 133 pages, not sure we can copy for her.

## 2019-01-22 NOTE — Telephone Encounter (Signed)
° °  Patient calling to request call from [facility].  Patient would also like copy of 30 event monitor strip and report

## 2019-01-24 NOTE — Telephone Encounter (Signed)
Spoke with pt, aware the monitor will need to be printed and I will leave it at the [place] office for her to pick up.

## 2019-01-25 ENCOUNTER — Ambulatory Visit (HOSPITAL_BASED_OUTPATIENT_CLINIC_OR_DEPARTMENT_OTHER)
Admission: RE | Admit: 2019-01-25 | Discharge: 2019-01-25 | Disposition: A | Discharge status: Home / Self Care | Payer: Medicare Other | Source: Ambulatory Visit | Attending: Cardiology | Admitting: Cardiology

## 2019-01-25 ENCOUNTER — Other Ambulatory Visit: Payer: Self-pay

## 2019-01-25 ENCOUNTER — Ambulatory Visit: Payer: Medicare Other | Attending: Family Medicine | Admitting: Physical Therapy

## 2019-01-25 DIAGNOSIS — R293 Abnormal posture: Secondary | ICD-10-CM | POA: Insufficient documentation

## 2019-01-25 DIAGNOSIS — R002 Palpitations: Secondary | ICD-10-CM | POA: Insufficient documentation

## 2019-01-25 DIAGNOSIS — I059 Rheumatic mitral valve disease, unspecified: Secondary | ICD-10-CM | POA: Insufficient documentation

## 2019-01-25 DIAGNOSIS — M6281 Muscle weakness (generalized): Secondary | ICD-10-CM | POA: Diagnosis not present

## 2019-01-25 DIAGNOSIS — I10 Essential (primary) hypertension: Secondary | ICD-10-CM | POA: Diagnosis not present

## 2019-01-25 DIAGNOSIS — M545 Low back pain, unspecified: Secondary | ICD-10-CM

## 2019-01-25 DIAGNOSIS — I4891 Unspecified atrial fibrillation: Secondary | ICD-10-CM | POA: Insufficient documentation

## 2019-01-25 DIAGNOSIS — R252 Cramp and spasm: Secondary | ICD-10-CM

## 2019-01-25 DIAGNOSIS — E785 Hyperlipidemia, unspecified: Secondary | ICD-10-CM | POA: Insufficient documentation

## 2019-01-25 NOTE — Progress Notes (Signed)
  Echocardiogram 2D Echocardiogram has been performed.  Stephanie Rollins T Jenel Gierke 01/25/2019, 3:05 PM

## 2019-01-25 NOTE — Therapy (Addendum)
[facility] [address]  [place], [place], [address] Phone: [phone]   Fax:  [phone]  Physical Therapy Evaluation / Discharge Summary  Patient Details  Name: Stephanie Rollins MRN: [id number] Date of Birth: May 22, 1951 Referring Provider (PT): Gregor Hams, MD   Encounter Date: 01/25/2019  PT End of Session - 01/25/19 1530    Visit Number  1    Number of Visits  16    Date for PT Re-Evaluation  03/22/19    Authorization Type  Medicare & Medicaid    PT Start Time  7408    PT Stop Time  1624    PT Time Calculation (min)  54 min    Activity Tolerance  Patient tolerated treatment well;Patient limited by pain;Patient limited by fatigue    Behavior During Therapy  [facility] for tasks assessed/performed       Past Medical History:  Diagnosis Date  . Anal fistula   . Atrial fibrillation ([place])   . Dyslipidemia   . Hearing loss    75%  . Hypertension   . IBS (irritable bowel syndrome)    remote history  . Interstitial cystitis   . Kidney stone   . Menopausal state   . MVP (mitral valve prolapse)   . Paroxysmal A-fib (HCC)    status post ablation  . Swallowing difficulty    problems for second/need dentures    Past Surgical History:  Procedure Laterality Date  . CARDIAC ELECTROPHYSIOLOGY MAPPING AND ABLATION     for A fib  . [place]   x 2  . NASAL SEPTUM SURGERY  1977  . pulmonary vein isolation  06-2006   procedure for AFIB /[facility]    There were no vitals filed for this visit.   Subjective Assessment - 01/25/19 1538    Subjective  L1 compression fracture discovered in Dec after she started having back pain which she thought was kidney related. L5 compression fracture discovered in January. L5 compression fracture putting pressure on the nerve root causing pain, numbness and tingling into sacrum and rectal area with weakness in LEs R>L and tightness ("hard" feeling) in  thighs. Also noted intermittent swelling and cramping in legs.    Pertinent History  recent L1 & L5 compression fractures, osteoporosis, severe malnutrition, OA, hearing loss/HOH    Limitations  Sitting;Lifting;Standing;Walking;House hold activities    Patient Stated Goals  "to decrease the pain and get my strength back so I can move around better"    Currently in Pain?  Yes    Pain Score  5    4-5/10; up to 10/10 at worst   Pain Location  Back    Pain Orientation  Lower;Right    Pain Descriptors / Indicators  Sore    Pain Type  Acute pain    Pain Onset  More than a month ago    Pain Frequency  Constant    Aggravating Factors   leaning forward or to the right    Pain Relieving Factors  heating pad, ice         [facility] PT Assessment - 01/25/19 1530      Assessment   Medical Diagnosis  L5 compression fracture    Referring Provider (PT)  Gregor Hams, MD    Onset Date/Surgical Date  --   January 2020   Next MD Visit  01/29/19    Prior Therapy  PT for back in 1980s  Balance Screen   Has the patient fallen in the past 6 months  No    Has the patient had a decrease in activity level because of a fear of falling?   Yes    Is the patient reluctant to leave their home because of a fear of falling?   No      Home Social worker  Private residence    Living Arrangements  Children   dtr   Available Help at Discharge  Family    Type of [place] to enter    Entrance Stairs-Number of Steps  2    [place]  One level    [place] - standard;Bedside commode      Prior Function   Level of Independence  Independent    Vocation  Retired    Leisure  sedentary      Cognition   Overall Cognitive Status  Within Functional Limits for tasks assessed      Posture/Postural Control   Posture/Postural Control  Postural limitations    Postural Limitations  Forward head;Rounded Shoulders;Increased  thoracic kyphosis;Decreased lumbar lordosis;Posterior pelvic tilt;Flexed trunk      ROM / Strength   AROM / PROM / Strength  Strength      Strength   Strength Assessment Site  Hip;Knee;Ankle    Right/Left Hip  Right;Left    Right Hip Flexion  3/5    Right Hip Extension  3-/5    Right Hip External Rotation   3/5    Right Hip Internal Rotation  3/5    Right Hip ABduction  3+/5    Right Hip ADduction  3+/5    Left Hip Flexion  3+/5    Left Hip Extension  3-/5    Left Hip External Rotation  3+/5    Left Hip Internal Rotation  4-/5    Left Hip ABduction  4-/5    Left Hip ADduction  4-/5    Right/Left Knee  Right;Left    Right Knee Flexion  3+/5    Right Knee Extension  4-/5    Left Knee Flexion  3+/5    Left Knee Extension  4-/5    Right/Left Ankle  Right;Left    Right Ankle Dorsiflexion  3+/5    Left Ankle Dorsiflexion  3+/5      Ambulation/Gait   Ambulation/Gait Assistance  5: Supervision;4: Min guard    Ambulation/Gait Assistance Details  Cues for upright posture and RW proximity.    Ambulation Distance (Feet)  60 Feet    Assistive device  Rolling walker    Gait Pattern  Step-through pattern;Decreased stride length;Trunk flexed    Gait velocity  0.99 ft/sec      Standardized Balance Assessment   Standardized Balance Assessment  10 meter walk test    10 Meter Walk  33.12 sec with RW                Objective measurements completed on examination: See above findings.              PT Education - 01/25/19 1624    Education Details  PT eval findings & need for further assessment, anticipated POC    Person(s) Educated  Patient    Methods  Explanation    Comprehension  Verbalized understanding       PT Short Term Goals - 01/25/19 1624  PT SHORT TERM GOAL #1   Title  Independent with initial HEP to increase back extension strength and decrease pain    Status  New    [place] Date  02/22/19      PT SHORT TERM GOAL #2   Title  Patient will  verbalize/demonstrate understanding of osteoporosis related precautions with everyday activities to reduce fracture risk    Status  New    Target Date  02/22/19      PT SHORT TERM GOAL #3   Title  Patient will ambulate with good posture and safe gait pattern with RW to reduce risk for falls    Status  New    Target Date  02/22/19        PT Long Term Goals - 01/25/19 1624      PT LONG TERM GOAL #1   Title  Independent with ongoing/advance HEP +/- balance program     Status  New    Target Date  03/22/19      PT LONG TERM GOAL #2   Title  B LE strength grossly >/= 4/5 for improved stability    Status  New    Target Date  03/22/19      PT LONG TERM GOAL #3   Title  Patient will be able to ambulate >250 ft with RW or LRAD demonstrating good gait pattern and gait speed >/= 1.8 ft/sec to increase safe community ambulation    Status  New    Target Date  03/22/19      PT LONG TERM GOAL #4   Title  Patient to report ability to perform ADLs, mobility, gait and light household chores w/o increased back pain > 4-5/10    Status  New    Target Date  03/22/19             Plan - 01/25/19 1624    Clinical Impression Statement  Stephanie Rollins is a 68 y/o female referred to OP PT for L5 vertebral compression fracture. Pt reports h/o 2 recent compression fractures w/o known MOI, L1 identified in December 2019 and L5 identified in January 2020. Pt reports pain from compression fracture relatively mild but L5 compression fracture causing nerve root compression with pain, numbness and tingling into sacrum and rectal area with weakness in LEs R>L. Pt reports significant decline in functional status since December at which time she was independently mobile w/o AD and able to complete all ADLs w/o restrictions - now she has to rely on SW for ambulation and limited in ADLs including having to use BSC and unable to shower (currently only sponge bathing and using dry shampoo). Pt reports she is trying to have  SW converted to or switched out for RW as repetitive lifting of SW irritating her hands and back - PT encouraged pt to follow up on this to reduce strain on back and existing compression fractures. Moderate weakness evident throughout B LE with walking tolerance limited to ~50-60 ft with RW on eval and gait speed only 0.99 ft/sec placing pt at household ambulation level and at high risk for recurrent falls. Further mobility and balance assessment deferred today as pt eating during eval having come straight to PT eval from echocardiogram with cardiologist upstairs and reporting need to eat ~2 hrs to meet nutritional requirements. Stephanie Rollins will benefit from skilled PT to decrease pain as well as increase strength, flexibility and ROM for improved function with decreased pain interference.    Personal Factors and Comorbidities  Comorbidity  3+;Age;Fitness;Social Background;Transportation    Comorbidities  recent L1 & L5 compression fractures, osteoporosis, severe malnutrition, OA, hearing loss/HOH (additional PMH as above)    Examination-Activity Limitations  Bathing;Bed Mobility;Bend;Carry;Dressing;Hygiene/Grooming;Lift;Locomotion Level;Reach Overhead;Sit;Squat;Stairs;Stand;Toileting;Transfers    Examination-Participation Restrictions  Community Activity    Stability/Clinical Decision Making  Unstable/Unpredictable    Clinical Decision Making  High    Rehab Potential  Fair    PT Frequency  2x / week    PT Duration  8 weeks    PT Treatment/Interventions  Patient/family education;ADLs/Self Care Home Management;Neuromuscular re-education;Therapeutic exercise;Therapeutic activities;Functional mobility training;Gait training;Stair training;Balance training;Manual techniques;Passive range of motion;Dry needling;Taping;Cryotherapy;Moist Heat;Electrical Stimulation;Iontophoresis 58m/ml Dexamethasone;Ultrasound    PT Next Visit Plan  Assess bed mobility, transfers and balance; Education on osteoporsis, fall  precautions, decompression exercises    Consulted and Agree with Plan of Care  Patient       Patient will benefit from skilled therapeutic intervention in order to improve the following deficits and impairments:  Abnormal gait, Decreased activity tolerance, Decreased balance, Decreased endurance, Decreased knowledge of precautions, Decreased knowledge of use of DME, Decreased mobility, Decreased range of motion, Decreased safety awareness, Decreased strength, Difficulty walking, Increased edema, Increased muscle spasms, Impaired perceived functional ability, Impaired flexibility, Impaired sensation, Impaired tone, Improper body mechanics, Postural dysfunction, Pain  Visit Diagnosis: Acute bilateral low back pain without sciatica - Plan: PT plan of care cert/re-cert  Muscle weakness (generalized) - Plan: PT plan of care cert/re-cert  Abnormal posture - Plan: PT plan of care cert/re-cert  Cramp and spasm - Plan: PT plan of care cert/re-cert     Problem List Patient Active Problem List   Diagnosis Date Noted  . Severe protein-calorie malnutrition ([place] 01/08/2019  . Compression fracture of fifth lumbar vertebra (HCC) 10/31/2018  . Hearing loss 09/20/2017  . Non-traumatic compression fracture of T7 thoracic vertebra, sequela 12/21/2016  . Osteoarthritis of both hands 05/03/2016  . Mitral valve prolapse 08/07/2014  . PREMATURE VENTRICULAR CONTRACTIONS 10/07/2010  . GERD 11/13/2008  . OSTEOPOROSIS 10/15/2008  . INSULIN RESISTANCE SYNDROME 09/25/2008  . ECZEMA 12/29/2007  . CYSTITIS, CHRONIC INTERSTITIAL 11/17/2006  . KNEE PAIN, BILATERAL 10/24/2006  . HYPERCHOLESTEROLEMIA 08/30/2006  . ANXIETY 08/30/2006  . ATRIAL FIBRILLATION 08/30/2006  . RHINITIS, ALLERGIC 08/30/2006    JPercival Spanish PT, MPT 01/25/2019, 8:54 PM  [facility] [place] [address]Phone: [phone]  Fax:   [phone] Name: Stephanie CHALUPAMRN: [id number] of Birth: [birth year]  PHYSICAL THERAPY DISCHARGE SUMMARY  Visits from Start of Care: 1  Current functional level related to goals / functional outcomes:   Refer to above clinical impression for status as of last visit on 01/25/2019. Patient was placed on due to COVID-19 restrictions. Attempts to contact patient regarding need/desire to resume PT have been unanswered, therefore will proceed with discharge from PT for this episode.   Remaining deficits:   As above.   Education / Equipment:   HEP  Plan: Patient agrees to discharge.  Patient goals were not met. Patient is being discharged due to meeting the stated rehab goals.  ?????     JPercival Spanish PT, MPT 06/01/19, 9:23 AM  [facility] [place] [address]Phone: [phone]  Fax:  [phone]

## 2019-01-29 ENCOUNTER — Ambulatory Visit: Payer: Medicare Other | Admitting: Family Medicine

## 2019-01-30 ENCOUNTER — Ambulatory Visit: Payer: Medicare Other | Admitting: Physical Therapy

## 2019-01-30 DIAGNOSIS — S32050A Wedge compression fracture of fifth lumbar vertebra, initial encounter for closed fracture: Secondary | ICD-10-CM | POA: Diagnosis not present

## 2019-02-06 ENCOUNTER — Encounter: Payer: Medicare Other | Admitting: Physical Therapy

## 2019-02-08 ENCOUNTER — Encounter: Payer: Medicare Other | Admitting: Physical Therapy

## 2019-02-08 DIAGNOSIS — G894 Chronic pain syndrome: Secondary | ICD-10-CM | POA: Diagnosis not present

## 2019-02-08 DIAGNOSIS — M47816 Spondylosis without myelopathy or radiculopathy, lumbar region: Secondary | ICD-10-CM | POA: Diagnosis not present

## 2019-02-08 DIAGNOSIS — S32050A Wedge compression fracture of fifth lumbar vertebra, initial encounter for closed fracture: Secondary | ICD-10-CM | POA: Diagnosis not present

## 2019-02-08 DIAGNOSIS — S22080A Wedge compression fracture of T11-T12 vertebra, initial encounter for closed fracture: Secondary | ICD-10-CM | POA: Diagnosis not present

## 2019-02-08 DIAGNOSIS — M8000XA Age-related osteoporosis with current pathological fracture, unspecified site, initial encounter for fracture: Secondary | ICD-10-CM | POA: Diagnosis not present

## 2019-02-09 ENCOUNTER — Telehealth: Payer: Self-pay

## 2019-02-09 NOTE — Telephone Encounter (Signed)
Primary Cardiologist: Dr. Berlinda [facility] Pre-operative Risk Assessment    Request for surgical clearance:  1. What type of surgery is being performed? Kyphoplasty  2. When is this surgery scheduled? TBD  3. What type of clearance is required (medical clearance vs. Pharmacy clearance to hold med vs. Both)? Medical  4. Are there any medications that need to be held prior to surgery and how long? Not listed  5. Practice name and name of physician performing surgery?  [facility] (Dr. Ellouise Newer)  6. What is your office phone number [phone]   7.   What is your office fax number [phone]  8.   Anesthesia type (None, local, MAC, general) ? Not listed   Lamar Laundry 02/09/2019, 1:41 PM  _________________________________________________________________   (provider comments below)

## 2019-02-10 DIAGNOSIS — M47816 Spondylosis without myelopathy or radiculopathy, lumbar region: Secondary | ICD-10-CM | POA: Insufficient documentation

## 2019-02-10 DIAGNOSIS — G894 Chronic pain syndrome: Secondary | ICD-10-CM | POA: Insufficient documentation

## 2019-02-10 DIAGNOSIS — M810 Age-related osteoporosis without current pathological fracture: Secondary | ICD-10-CM | POA: Insufficient documentation

## 2019-02-12 NOTE — Telephone Encounter (Signed)
Left VM for pt to call back to discuss clearance.

## 2019-02-13 ENCOUNTER — Telehealth: Payer: Self-pay | Admitting: Family Medicine

## 2019-02-13 ENCOUNTER — Ambulatory Visit: Payer: Medicare Other | Admitting: Physical Therapy

## 2019-02-13 NOTE — Telephone Encounter (Signed)
Patient scheduled.

## 2019-02-13 NOTE — Telephone Encounter (Signed)
Pt called requesting appt with [facility]. Pt has several concerns. Pt concerned with feet swelling & malnutrition & osteoporosis. Please call pt to triage for appt in office or appt via telephone.

## 2019-02-14 NOTE — Telephone Encounter (Signed)
LMTCB  Neesha Langton PA-C 02/14/2019 1:23 PM

## 2019-02-15 ENCOUNTER — Ambulatory Visit (INDEPENDENT_AMBULATORY_CARE_PROVIDER_SITE_OTHER): Payer: Medicare Other | Admitting: Family Medicine

## 2019-02-15 ENCOUNTER — Other Ambulatory Visit: Payer: Self-pay

## 2019-02-15 ENCOUNTER — Encounter: Payer: Medicare Other | Admitting: Physical Therapy

## 2019-02-15 ENCOUNTER — Encounter: Payer: Self-pay | Admitting: Family Medicine

## 2019-02-15 VITALS — BP 134/62 | HR 90 | Temp 98.6°F | Ht 63.0 in | Wt 103.4 lb

## 2019-02-15 DIAGNOSIS — R7309 Other abnormal glucose: Secondary | ICD-10-CM | POA: Diagnosis not present

## 2019-02-15 DIAGNOSIS — M810 Age-related osteoporosis without current pathological fracture: Secondary | ICD-10-CM

## 2019-02-15 DIAGNOSIS — R0789 Other chest pain: Secondary | ICD-10-CM

## 2019-02-15 DIAGNOSIS — R799 Abnormal finding of blood chemistry, unspecified: Secondary | ICD-10-CM

## 2019-02-15 DIAGNOSIS — E559 Vitamin D deficiency, unspecified: Secondary | ICD-10-CM

## 2019-02-15 DIAGNOSIS — E348 Other specified endocrine disorders: Secondary | ICD-10-CM | POA: Diagnosis not present

## 2019-02-15 DIAGNOSIS — S32050A Wedge compression fracture of fifth lumbar vertebra, initial encounter for closed fracture: Secondary | ICD-10-CM

## 2019-02-15 DIAGNOSIS — M7989 Other specified soft tissue disorders: Secondary | ICD-10-CM

## 2019-02-15 MED ORDER — ALENDRONATE SODIUM 70 MG PO TABS: 70.0000 mg | ORAL_TABLET | ORAL | 11 refills | Status: DC

## 2019-02-15 NOTE — Progress Notes (Signed)
Pt reports that this began about 01/24/2019 in both of her feet. More in so in her R. She said it is sore to touch she tries to massage them and some days more than others. The orthopedist asked that she f/u with pcp to be sure that there wasn't something else going on.    She stated that Dr. Denyse Amass suggested that she should start taking a bone building medication because of her having osteoporosis and the compression fx. She wanted to know what medication would be good the oral or injection. She also was asking about the amount of calories,protiens she should be eating. She reports that she has been eating better but its still a struggle for her.   Also she stated that her granddaughter(Bridget Puryear) is living w/her and she works at [employer] and will possible need a note for her job stating that because of her grandmother's conditions she will need to stay home.Marland Kitchen...mme

## 2019-02-15 NOTE — Progress Notes (Signed)
Virtual Visit via Telephone Note  I connected with Stephanie Rollins on 02/15/19 at  3:00 PM EDT by telephone and verified that I am speaking with the correct person using two identifiers.   I discussed the limitations, risks, security and privacy concerns of performing an evaluation and management service by telephone and the availability of in person appointments. I also discussed with the patient that there may be a patient responsible charge related to this service. The patient expressed understanding and agreed to proceed.   Subjective:    CC: Swelling, foot  HPI:  Pt reports that this began about 01/24/2019 in both of her feet. More in so in her R. She said it is sore to touch she tries to massage them and some days more than others. The orthopedist asked that she f/u with pcp to be sure that there wasn't something else going on. No weight change.  The swelling goes up to her knees.  Some days better than other.  Better after she lays down for awhile. No medication changes. No diet changes.  Not on NSAIDs.  She has been wearing her compression stocking.  Her weight is up 2 lbs.  She says she doesn't add salt but says she has been eating a lot of beans.  She stated that Dr. Denyse Amass suggested that she should start taking a bone building medication because of her having osteoporosis and the compression fx. She wanted to know what medication would be good the oral or injection. She also was asking about the amount of calories,protiens she should be eating. She reports that she has been eating better but its still a struggle for her.   Also she stated that her granddaughter(Bridget Puryear) is living w/her and she works at [employer] and will possible need a note for her job stating that because of her grandmother's conditions she will need to stay home....  Past medical history, Surgical history, Family history not pertinant except as noted below, Social history, Allergies, and medications  have been entered into the medical record, reviewed, and corrections made.   Review of Systems: No fevers, chills, night sweats, weight loss, chest pain, or shortness of breath.   Objective:    General: Speaking clearly without any difficulty or shortness of breath.  Alert and oriented.  Normal judgment..   Impression and Recommendations:    Lower extremity edema -lower extremity edema.  Unclear etiology at this point but based on her description it sounds like dependent edema as it does get better at night when she elevates her legs.  She did wear compression stockings one night but says she could barely get them back off.  We discussed elevating them as much as possible.  Making sure she continues to watch the salt intake in her diet.  It does make me concerned that she has been eating a lot of beans trying to increase her protein but I wonder if she is actually getting a lot more sodium than she thinks she is.  It sounds like she does hydrate adequately she drinks between 6070 ounces of water per day.  Discussed that really the next piece of work-up would to be to do blood work including evaluate for thyroid disorder, anemia, electrolyte disturbance and renal function as well as liver function.  She will go to the lab tomorrow to have that done.  Will call with results once available.  IFG - due to recheck A1C.  We will have this drawn on her  blood work as well.  No increased thirst or urination.  Osteoporosis - will check Vitamin D. She is taking of calcium daily.  She says it has vitamin D.  We did discuss bisphosphonates as an option.  I think she should start with an oral bisphosphonate before we consider Prolia or IV Reclast.  She seemed very nervous about it.  I will go ahead and send over Fosamax to the pharmacy.   Orders Placed This Encounter  Procedures  . TSH  . CBC  . COMPLETE METABOLIC PANEL WITH GFR  . Urinalysis, Routine w reflex microscopic  . B Nat Peptide  .  Hemoglobin A1c  . VITAMIN D 25 Hydroxy (Vit-D Deficiency, Fractures)     I discussed the assessment and treatment plan with the patient. The patient was provided an opportunity to ask questions and all were answered. The patient agreed with the plan and demonstrated an understanding of the instructions.   The patient was advised to call back or seek an in-person evaluation if the symptoms worsen or if the condition fails to improve as anticipated.  I provided 22 minutes of non-face-to-face time during this encounter.   Nani Gasser, MD

## 2019-02-16 ENCOUNTER — Telehealth: Payer: Self-pay

## 2019-02-16 ENCOUNTER — Encounter: Payer: Self-pay | Admitting: Family Medicine

## 2019-02-16 ENCOUNTER — Ambulatory Visit (INDEPENDENT_AMBULATORY_CARE_PROVIDER_SITE_OTHER): Payer: Medicare Other | Admitting: Family Medicine

## 2019-02-16 DIAGNOSIS — R7309 Other abnormal glucose: Secondary | ICD-10-CM | POA: Diagnosis not present

## 2019-02-16 DIAGNOSIS — R0789 Other chest pain: Secondary | ICD-10-CM | POA: Diagnosis not present

## 2019-02-16 DIAGNOSIS — S32050A Wedge compression fracture of fifth lumbar vertebra, initial encounter for closed fracture: Secondary | ICD-10-CM | POA: Diagnosis not present

## 2019-02-16 DIAGNOSIS — E348 Other specified endocrine disorders: Secondary | ICD-10-CM | POA: Diagnosis not present

## 2019-02-16 DIAGNOSIS — J029 Acute pharyngitis, unspecified: Secondary | ICD-10-CM

## 2019-02-16 DIAGNOSIS — R799 Abnormal finding of blood chemistry, unspecified: Secondary | ICD-10-CM | POA: Diagnosis not present

## 2019-02-16 DIAGNOSIS — M810 Age-related osteoporosis without current pathological fracture: Secondary | ICD-10-CM | POA: Diagnosis not present

## 2019-02-16 DIAGNOSIS — E559 Vitamin D deficiency, unspecified: Secondary | ICD-10-CM | POA: Diagnosis not present

## 2019-02-16 DIAGNOSIS — R059 Cough, unspecified: Secondary | ICD-10-CM

## 2019-02-16 DIAGNOSIS — R05 Cough: Secondary | ICD-10-CM | POA: Diagnosis not present

## 2019-02-16 DIAGNOSIS — M7989 Other specified soft tissue disorders: Secondary | ICD-10-CM | POA: Diagnosis not present

## 2019-02-16 LAB — POCT RAPID STREP A (OFFICE): Rapid Strep A Screen: NEGATIVE

## 2019-02-16 NOTE — Patient Instructions (Addendum)
Recommend continue with start water gargles and swish 3 times a day.  Make sure you are drinking plenty of water to moisturize your throat.  You can certainly use lozenges if needed. I would also recommend that you consider over-the-counter medications such as Claritin for allergies or you can use a nasal spray like Flonase or Nasonex that is not systemic if you would prefer to do that for your allergies.  If you feel like your cough is getting worse or you develop an increased temperature or you feel short of breath then please let us know.

## 2019-02-16 NOTE — Telephone Encounter (Signed)
Stephanie Rollins complains of mouth soreness and believes it could be thrush. She also complains of allergies and a slight productive cough and would like a chest x-ray. Denies fever. Please advise.

## 2019-02-16 NOTE — Telephone Encounter (Signed)
Please schedule for this afternoon on my schedule.

## 2019-02-16 NOTE — Telephone Encounter (Signed)
Patient scheduled.

## 2019-02-16 NOTE — Progress Notes (Signed)
Acute Office Visit  Subjective:    Patient ID: Stephanie Rollins, female    DOB: July 06, 1951, 68 y.o.   MRN: [id number]  Chief Complaint  Patient presents with  . Cough  . Mouth Lesions    HPI Patient is in today for she complains of cough for a couple of days she has been coughing up mostly clear phlegm.  She thinks it could be allergies but she is not sure.  Plus she has had a sore throat.  She is also noticed a couple of red spots on the back of her throat which she thinks could be thrush.  She also feels like there is a coating on the inside of her cheeks and noticed that her tongue has been burning.  She denies any fever chills or sweats.  Though she says her temperature has been running at 98 which is actually a little bit high for her.  She says her daughter has been having some cough and allergy symptoms as well.  Past Medical History:  Diagnosis Date  . Anal fistula   . Atrial fibrillation (HCC)   . Dyslipidemia   . Hearing loss    75%  . Hypertension   . IBS (irritable bowel syndrome)    remote history  . Interstitial cystitis   . Kidney stone   . Menopausal state   . MVP (mitral valve prolapse)   . Paroxysmal A-fib (HCC)    status post ablation  . Swallowing difficulty    problems for second/need dentures    Past Surgical History:  Procedure Laterality Date  . CARDIAC ELECTROPHYSIOLOGY MAPPING AND ABLATION     for A fib  . CESAREAN SECTION  1971, 1973   x 2  . NASAL SEPTUM SURGERY  1977  . pulmonary vein isolation  06-2006   procedure for AFIB /[facility]    Family History  Problem Relation Age of Onset  . Hypertension Mother   . Diabetes Maternal Grandmother   . Heart disease Brother   . Stroke Paternal Aunt   . Heart attack Father   . Heart disease Sister        x 2  . Colon cancer Neg Hx   . Esophageal cancer Neg Hx     Social History   Socioeconomic History  . Marital status: Divorced    Spouse name: Not on file  . Number of  children: 2  . Years of education: Not on file  . Highest education level: Not on file  Occupational History  . Occupation: retired  Engineer, production  . Financial resource strain: Not on file  . Food insecurity:    Worry: Not on file    Inability: Not on file  . Transportation needs:    Medical: Not on file    Non-medical: Not on file  Tobacco Use  . Smoking status: Never Smoker  . Smokeless tobacco: Never Used  Substance and Sexual Activity  . Alcohol use: No  . Drug use: No  . Sexual activity: Not on file    Comment: unemployed, GED, divorced,2 adult children, no caffeine, no regular exercise  Lifestyle  . Physical activity:    Days per week: Not on file    Minutes per session: Not on file  . Stress: Not on file  Relationships  . Social connections:    Talks on phone: Not on file    Gets together: Not on file    Attends religious service: Not on file  Active member of club or organization: Not on file    Attends meetings of clubs or organizations: Not on file    Relationship status: Not on file  . Intimate partner violence:    Fear of current or ex partner: Not on file    Emotionally abused: Not on file    Physically abused: Not on file    Forced sexual activity: Not on file  Other Topics Concern  . Not on file  Social History Narrative  . Not on file    Outpatient Medications Prior to Visit  Medication Sig Dispense Refill  . alendronate (FOSAMAX) 70 MG tablet Take 1 tablet (70 mg total) by mouth every 7 (seven) days. Take with a full glass of water on an empty stomach. 4 tablet 11  . aspirin 81 MG chewable tablet Chew 81 mg by mouth daily.     Marland Kitchen CALCIUM PO Take 1 tablet by mouth daily.    . metoprolol tartrate (LOPRESSOR) 25 MG tablet Take 1 tablet (25 mg total) by mouth 2 (two) times daily. 180 tablet 3   No facility-administered medications prior to visit.     Allergies  Allergen Reactions  . Amoxicillin Rash and Other (See Comments)  . Ampicillin   .  Azithromycin     REACTION: SOB  . Cephalexin   . Clarithromycin   . Codeine   . Flecainide   . Penicillins     REACTION: rash  . Shellfish Allergy     Sick to stomach   . Shellfish-Derived Products Hives and Nausea And Vomiting    ROS     Objective:    Physical Exam  Constitutional: She is oriented to person, place, and time. She appears well-developed and well-nourished.  HENT:  Head: Normocephalic and atraumatic.  Right Ear: External ear normal.  Left Ear: External ear normal.  Nose: Nose normal.  Mouth/Throat: Oropharynx is clear and moist.  TMs and canals are clear.   Eyes: Pupils are equal, round, and reactive to light. Conjunctivae and EOM are normal.  Neck: Neck supple. No thyromegaly present.  Cardiovascular: Normal rate, regular rhythm and normal heart sounds.  Pulmonary/Chest: Effort normal and breath sounds normal. She has no wheezes.  Lymphadenopathy:    She has no cervical adenopathy.  Neurological: She is alert and oriented to person, place, and time.  Skin: Skin is warm and dry.  Psychiatric: She has a normal mood and affect.    BP 117/67   Pulse 79   Temp 98.5 F (36.9 C)   Ht 5\' 3"  (1.6 m)   Wt 103 lb (46.7 kg)   SpO2 97%   BMI 18.25 kg/m  Wt Readings from Last 3 Encounters:  02/16/19 103 lb (46.7 kg)  02/15/19 103 lb 6.4 oz (46.9 kg)  12/19/18 103 lb (46.7 kg)    Health Maintenance Due  Topic Date Due  . PNA vac Low Risk Adult (1 of 2 - PCV13) 06/08/2014  . MAMMOGRAM  10/03/2014  . COLONOSCOPY  03/17/2016    There are no preventive care reminders to display for this patient.   Lab Results  Component Value Date   TSH 3.04 09/20/2017   Lab Results  Component Value Date   WBC 7.9 10/30/2018   HGB 12.9 10/30/2018   HCT 40.8 10/30/2018   MCV 90.7 10/30/2018   PLT 263 10/30/2018   Lab Results  Component Value Date   NA 138 10/30/2018   K 3.0 (L) 10/30/2018   CO2 27  10/30/2018   GLUCOSE 117 (H) 10/30/2018   BUN <5 (L)  10/30/2018   CREATININE 0.49 10/30/2018   BILITOT 0.6 10/30/2018   ALKPHOS 83 10/30/2018   AST 18 10/30/2018   ALT 10 10/30/2018   PROT 8.4 (H) 10/30/2018   ALBUMIN 4.8 10/30/2018   CALCIUM 9.6 10/30/2018   ANIONGAP 9 10/30/2018   Lab Results  Component Value Date   CHOL 196 09/20/2017   Lab Results  Component Value Date   HDL 70 09/20/2017   Lab Results  Component Value Date   LDLCALC 103 (H) 09/20/2017   Lab Results  Component Value Date   TRIG 132 09/20/2017   Lab Results  Component Value Date   CHOLHDL 2.8 09/20/2017   Lab Results  Component Value Date   HGBA1C 5.3 09/20/2017       Assessment & Plan:   Problem List Items Addressed This Visit    None    Visit Diagnoses    Cough       Sore throat       Relevant Orders   POCT rapid strep A   WET PREP FOR TRICH, YEAST, CLUE     Cough-suspect could be allergic related.  Exam of lungs is clear today.  Pulse ox is normal and reassuring she is speaking in complete sentences without any difficulty.  Recommend a trial of over-the-counter Claritin and or a nasal spray such as Flonase or Nasonex.  Pharyngitis-possibly viral or from postnasal drip from allergies.  She went wanted to be tested for strep throat so we did that today it is quite rampant in the community right now.  I did go ahead and do a swab to evaluate for thrush even because I did not see any evidence of thrush on exam.  Rapid strep was negative.  Recommend salt water gargles for symptomatic care at least initially.  No orders of the defined types were placed in this encounter.    Nani Gasser, MD

## 2019-02-17 LAB — URINALYSIS, ROUTINE W REFLEX MICROSCOPIC
Bilirubin Urine: NEGATIVE
Glucose, UA: NEGATIVE
Hgb urine dipstick: NEGATIVE
Ketones, ur: NEGATIVE
Leukocytes,Ua: NEGATIVE
Nitrite: NEGATIVE
Protein, ur: NEGATIVE
Specific Gravity, Urine: 1.005 (ref 1.001–1.03)
pH: 6 (ref 5.0–8.0)

## 2019-02-17 LAB — COMPLETE METABOLIC PANEL WITH GFR
AG RATIO: 1.4 (calc) (ref 1.0–2.5)
ALT: 8 U/L (ref 6–29)
AST: 13 U/L (ref 10–35)
Albumin: 4.1 g/dL (ref 3.6–5.1)
Alkaline phosphatase (APISO): 91 U/L (ref 37–153)
BUN: 7 mg/dL (ref 7–25)
CO2: 26 mmol/L (ref 20–32)
Calcium: 9.6 mg/dL (ref 8.6–10.4)
Chloride: 104 mmol/L (ref 98–110)
Creat: 0.62 mg/dL (ref 0.50–0.99)
GFR, Est African American: 107 mL/min/{1.73_m2} (ref >=60)
GFR, Est Non African American: 92 mL/min/{1.73_m2} (ref >=60)
Globulin: 2.9 g/dL (calc) (ref 1.9–3.7)
Glucose, Bld: 99 mg/dL (ref 65–99)
Potassium: 3.9 mmol/L (ref 3.5–5.3)
Sodium: 142 mmol/L (ref 135–146)
Total Bilirubin: 0.8 mg/dL (ref 0.2–1.2)
Total Protein: 7 g/dL (ref 6.1–8.1)

## 2019-02-17 LAB — CBC
HCT: 37.6 % (ref 35.0–45.0)
Hemoglobin: 12.5 g/dL (ref 11.7–15.5)
MCH: 29.2 pg (ref 27.0–33.0)
MCHC: 33.2 g/dL (ref 32.0–36.0)
MCV: 87.9 fL (ref 80.0–100.0)
MPV: 11.5 fL (ref 7.5–12.5)
PLATELETS: 208 10*3/uL (ref 140–400)
RBC: 4.28 10*6/uL (ref 3.80–5.10)
RDW: 12.6 % (ref 11.0–15.0)
WBC: 9.5 10*3/uL (ref 3.8–10.8)

## 2019-02-17 LAB — VITAMIN D 25 HYDROXY (VIT D DEFICIENCY, FRACTURES): VIT D 25 HYDROXY: 31 ng/mL (ref 30–100)

## 2019-02-17 LAB — WET PREP FOR TRICH, YEAST, CLUE
MICRO NUMBER:: 358756
Specimen Quality: ADEQUATE

## 2019-02-17 LAB — TSH: TSH: 2.92 mIU/L (ref 0.40–4.50)

## 2019-02-17 LAB — HEMOGLOBIN A1C
Hgb A1c MFr Bld: 5.4 % of total Hgb (ref <5.7)
Mean Plasma Glucose: 108 (calc)
eAG (mmol/L): 6 (calc)

## 2019-02-17 LAB — BRAIN NATRIURETIC PEPTIDE: Brain Natriuretic Peptide: 228 pg/mL — ABNORMAL HIGH (ref <100)

## 2019-02-19 MED ORDER — HYDROCHLOROTHIAZIDE 25 MG PO TABS: 25.0000 mg | ORAL_TABLET | Freq: Every day | ORAL | 2 refills | Status: DC

## 2019-02-19 NOTE — Addendum Note (Signed)
Addended by: Nani Gasser D on: 02/19/2019 02:39 PM   Modules accepted: Orders

## 2019-02-20 ENCOUNTER — Telehealth: Payer: Self-pay

## 2019-02-20 ENCOUNTER — Encounter: Payer: Self-pay | Admitting: *Deleted

## 2019-02-20 ENCOUNTER — Encounter: Payer: Medicare Other | Admitting: Physical Therapy

## 2019-02-20 NOTE — Telephone Encounter (Signed)
lvm informing Letter written. And can be picked up or printed from her my chart.Marland KitchenMarland KitchenMarland KitchenHeath Gold, CMA

## 2019-02-20 NOTE — Telephone Encounter (Signed)
Stephanie Rollins called and states Stephanie Rollins was working on a letter for her about being high risk of severe illness due to COVID-19.

## 2019-02-21 NOTE — Telephone Encounter (Signed)
LM for pt to call back have called twice and also her mobile number as well.

## 2019-02-22 ENCOUNTER — Telehealth: Payer: Self-pay | Admitting: Physical Therapy

## 2019-02-22 ENCOUNTER — Telehealth: Payer: Self-pay

## 2019-02-22 NOTE — Telephone Encounter (Signed)
Follow up   April with [facility] returned call. She advised that the procedure is still scheduled but it has no date until patient has her cardiac clearance. The number they have for her is [phone].

## 2019-02-22 NOTE — Telephone Encounter (Signed)
Called [facility]; spoke with operator who said she would send a message and have them contact us with the information requested per Bettina Gavia, PA.

## 2019-02-22 NOTE — Telephone Encounter (Signed)
Called patient back in response to request for PT to email HEP as patient unable to come into clinic due to high risk for COVID-19. No answer -  Left message requesting call back to determine what position(s) are best tolerated for performing exercises (sitting vs laying on back), so that PT may create HEP.

## 2019-02-23 NOTE — Telephone Encounter (Signed)
   Primary Cardiologist:Brian Jens Som, MD  Chart reviewed as part of pre-operative protocol coverage.   Several attempts have been made to contact patient on both numbers listed by multiple providers. Phone number was confirmed with the requesting surgeon's office. She has not been seen in 11 months. Please call and set up a telehealth visit with an APP on Dr. Ludwig Clarks team. OK for phone visit, prefer video visit. She has had recent echo and hotler monitor. I have added telehealth visit instructions to her MyChart.   I am removing from preop pool.   Pre-op covering staff: - Please schedule appointment and call patient to inform them.   - Please let the patient know they may receive a call from a restricted number.  - Please contact requesting surgeon's office via preferred method (i.e, phone, fax) to inform them of need for appointment prior to surgery.  If applicable, this message will also be routed to pharmacy pool and/or primary cardiologist for input on holding anticoagulant/antiplatelet agent as requested below so that this information is available at time of patient's appointment.   Roe Rutherford Nishaan Stanke, PA  02/23/2019, 12:07 PM

## 2019-02-23 NOTE — Telephone Encounter (Signed)
Called [facility] to inform them of need for appt proir to being cleared for surgery.   Attempted to call pt via mobile number and went straight to vm. Left message to call back to schedule appt. Also attempted home number and this went to vm as well. Left message to return call to schedule appt.   Routing this message to Deliah Goody, RN as well.

## 2019-02-27 ENCOUNTER — Ambulatory Visit: Payer: Medicare Other

## 2019-03-01 ENCOUNTER — Encounter: Payer: Medicare Other | Admitting: Physical Therapy

## 2019-03-06 ENCOUNTER — Encounter: Payer: Medicare Other | Admitting: Physical Therapy

## 2019-03-08 ENCOUNTER — Encounter: Payer: Medicare Other | Admitting: Physical Therapy

## 2019-03-08 NOTE — Telephone Encounter (Signed)
Attempted to reach pt but there was no answer.

## 2019-03-14 ENCOUNTER — Encounter: Payer: Self-pay | Admitting: Family Medicine

## 2019-03-20 ENCOUNTER — Encounter: Payer: Medicare Other | Admitting: Physical Therapy

## 2019-03-29 NOTE — Telephone Encounter (Signed)
4th attempt to reach pt re: needing an appt for surgical clearance. Left a detailed message for pt to call back.  Will delete this from preop pool and route back to requesting surgeon's office to make them aware pt hasn't been able to be reached.

## 2019-04-13 ENCOUNTER — Telehealth: Payer: Self-pay | Admitting: Family Medicine

## 2019-04-13 NOTE — Telephone Encounter (Signed)
Pt called clinic to see if PCP would review all recent labs (both here and [facility]) and see if she can figure out what's causing her to lose weight. She is down to 99lbs. States she eats "all the time." Scheduled for virtual visit with PCP next week to discuss.

## 2019-04-16 NOTE — Telephone Encounter (Signed)
The last labs I see from [facility] were form November.  I even refreshed/updated Care everywhere. Is she sure that is where she had labs done recently.

## 2019-04-17 ENCOUNTER — Ambulatory Visit (INDEPENDENT_AMBULATORY_CARE_PROVIDER_SITE_OTHER): Payer: Medicare Other | Admitting: Family Medicine

## 2019-04-17 ENCOUNTER — Encounter: Payer: Self-pay | Admitting: Family Medicine

## 2019-04-17 VITALS — BP 123/64 | HR 82 | Temp 97.7°F | Ht 63.0 in | Wt 98.4 lb

## 2019-04-17 DIAGNOSIS — Z1231 Encounter for screening mammogram for malignant neoplasm of breast: Secondary | ICD-10-CM | POA: Diagnosis not present

## 2019-04-17 DIAGNOSIS — R1310 Dysphagia, unspecified: Secondary | ICD-10-CM | POA: Diagnosis not present

## 2019-04-17 DIAGNOSIS — R636 Underweight: Secondary | ICD-10-CM | POA: Diagnosis not present

## 2019-04-17 DIAGNOSIS — R634 Abnormal weight loss: Secondary | ICD-10-CM | POA: Diagnosis not present

## 2019-04-17 DIAGNOSIS — R195 Other fecal abnormalities: Secondary | ICD-10-CM

## 2019-04-17 NOTE — Telephone Encounter (Signed)
The labs from [facility] in Nov 2019 are the ones she will want to discuss at upcoming OV along with recent labs completed here

## 2019-04-17 NOTE — Progress Notes (Signed)
Virtual Visit via Telephone Note  I connected with Stephanie Rollins on 04/17/19 at  2:00 PM EDT by telephone and verified that I am speaking with the correct person using two identifiers.   I discussed the limitations, risks, security and privacy concerns of performing an evaluation and management service by telephone and the availability of in person appointments. I also discussed with the patient that there may be a patient responsible charge related to this service. The patient expressed understanding and agreed to proceed.   Subjective:    CC: Abnormal weight loss  HPI: She has been losing weight on and off for the last several years.  In the last 6 months it has really dropped but she really hasn't eaten that much. She doesn't really have much of an appetite.  Before bed last night 101lb. She is concerned that something is not right and she has been eating a lot of calories and feels that she should be gaining weight and not loosing.   She has been really pushing herself to eat as much and as often as she possibly can. She had a brother to recently pass away from cancer and he was loosing Weight and she's concerned. She is not sure what kind of cancer.   For the last 3 month she has been eating 3 meals per day.  She has been getting 1800-2000 calories per days.  Protein is up to 40g per day.  She just can't seem to gain weight.    She had a BM last year that she thought looked like worms and is worried she could have a parasite she hasn't traveled and is not high risk for parasites.   She does have some "nerve pain" in her low back. Had MRI 12/30/2018. Followed for compression fractures by Dr. Laurian Brim'Toole.    Past medical history, Surgical history, Family history not pertinant except as noted below, Social history, Allergies, and medications have been entered into the medical record, reviewed, and corrections made.   Review of Systems: No fevers, chills, night sweats, weight loss, chest pain,  or shortness of breath.   Objective:    General: Speaking clearly in complete sentences without any shortness of breath.  Alert and oriented x3.  Normal judgment. No apparent acute distress.    Impression and Recommendations:    Abnormal weight loss/BMI 17/Underweight - she is due for cancer screening.  She is due for colon cancer screening, mammography. She has been having problem swallowing for going on a year at this point.  She actually had a consult with Dr. Barron Alvineirigliano last November and was supposed to be schedule for a scope but says had transportation issues getting to [place].  Will place new referral. Explained with abnormal weight loss I would recommend endoscopy to rule out stricture vs esophageal cancer, etc.  Will check Thyroid and CBC as well. Continue trying to eat about 1800-2000caloires per day.  We will also rule out thyroid disorder as well as check a CBC.  She has had some low back pain but recently had an MRI which did not show any worrisome bone lesions.  Consider CT of chest and abdomen if blood work is unrevealing.  She doesn't want to get a mammogram right now bc she feels she doesn't have enough breast tissue to do the exam.  I did go ahead and order the test that if she changes her mind she can get it done.  Dysphagia-I feel like if she goes ahead and at  least does the endoscopy that could potentially rule out oropharyngeal/esophageal and stomach cancer.  Plus if she goes ahead and does her colonoscopy at the same time which I strongly urged her to do so that she only has to have one procedure and she is only sedated once, than that also rules out colon cancer as a potential cause.    I discussed the assessment and treatment plan with the patient. The patient was provided an opportunity to ask questions and all were answered. The patient agreed with the plan and demonstrated an understanding of the instructions.   The patient was advised to call back or seek an in-person  evaluation if the symptoms worsen or if the condition fails to improve as anticipated.  I provided 25 minutes of non-face-to-face time during this encounter.   Nani Gasser, MD

## 2019-04-17 NOTE — Progress Notes (Signed)
Before bed last night 101lb. She is concerned that something is not right and she has been eating a lot of calories and feels that she should be gaining weight and not loosing.  She has been really pushing herself to eat as much and as often as she possibly can.   She had a brother to recently pass away from cancer and he was loosing  Weight and she's concerned.Heath Gold, CMA

## 2019-04-18 ENCOUNTER — Encounter: Payer: Self-pay | Admitting: Family Medicine

## 2019-04-19 ENCOUNTER — Encounter: Payer: Self-pay | Admitting: Family Medicine

## 2019-04-24 ENCOUNTER — Telehealth: Payer: Self-pay

## 2019-04-24 NOTE — Telephone Encounter (Signed)
Patient called and wanted me to leave msg with Stephanie Rollins to let her know that she has not received her letter in the mail. States it was written about 2 months ago, but she just recently asked Stephanie Rollins to mail. I advised pt it can take up to about 10 days to receive the mail from [place], she just asked that I verify with Stephanie Rollins that it was mailed.

## 2019-04-26 NOTE — Telephone Encounter (Signed)
lvm advising pt that this letter was sent and that I will send another one however she can either send someone to come by to p/u the letter. I also informed her that I have sent another letter out .Heath Gold, CMA

## 2019-05-09 ENCOUNTER — Encounter: Payer: Self-pay | Admitting: Physical Therapy

## 2019-05-17 ENCOUNTER — Telehealth: Payer: Self-pay

## 2019-05-17 DIAGNOSIS — R0781 Pleurodynia: Secondary | ICD-10-CM

## 2019-05-17 NOTE — Telephone Encounter (Signed)
1. Patient wanted to let Dr Madilyn Fireman know that she has noticed some foam in her urine, she will go get labs done tomorrow.   2. Patient reports she leaned over last week and felt a bad pop on her right side. She is requesting an order be put in for her to have an x-ray to make sure she did not break a rib

## 2019-05-18 NOTE — Telephone Encounter (Signed)
Left pt msg to call back with more details

## 2019-05-18 NOTE — Telephone Encounter (Signed)
It looks like we did order a urinalysis on the urine so hopefully we can check that out.  As far as the Pap on her right side we would need more detail on exactly where she felt it so that we could put a notation in if we are going to order a rib film.  Is it posterior or anterior?  Is it lowered towards the mid back or towards the shoulder blade?

## 2019-05-18 NOTE — Telephone Encounter (Signed)
ordre placed for xray. She can go anytime.

## 2019-05-18 NOTE — Telephone Encounter (Signed)
Left pt msg advising order placed

## 2019-05-18 NOTE — Telephone Encounter (Signed)
Pain is right sided, centered under breast, only painful in front. No bruising.

## 2019-05-21 ENCOUNTER — Telehealth: Payer: Self-pay | Admitting: Physical Therapy

## 2019-05-21 NOTE — Telephone Encounter (Signed)
Stephanie Rollins called and states she now has pain on the left side. She pulled something yesterday when she bent over. She would like bilateral rib x-ray. Please advise.

## 2019-05-21 NOTE — Telephone Encounter (Signed)
They are already going to do a full chest view  On the original order.

## 2019-05-21 NOTE — Telephone Encounter (Signed)
Message left for Stephanie Rollins regarding ongoing hold for PT services due to COVID-19. Requested call back regarding interest in resuming PT vs discharge from PT.  Percival Spanish, PT, MPT  [facility] [address]  [place], [place], [address] Phone: [phone]   Fax:  [phone]

## 2019-05-23 NOTE — Telephone Encounter (Signed)
Left pt msg advising of note

## 2019-05-28 ENCOUNTER — Other Ambulatory Visit: Payer: Self-pay

## 2019-05-28 ENCOUNTER — Encounter: Payer: Self-pay | Admitting: Family Medicine

## 2019-05-28 ENCOUNTER — Ambulatory Visit (INDEPENDENT_AMBULATORY_CARE_PROVIDER_SITE_OTHER): Payer: Medicare Other

## 2019-05-28 DIAGNOSIS — R634 Abnormal weight loss: Secondary | ICD-10-CM | POA: Diagnosis not present

## 2019-05-28 DIAGNOSIS — R0781 Pleurodynia: Secondary | ICD-10-CM | POA: Diagnosis not present

## 2019-05-28 DIAGNOSIS — J439 Emphysema, unspecified: Secondary | ICD-10-CM | POA: Diagnosis not present

## 2019-05-28 DIAGNOSIS — R195 Other fecal abnormalities: Secondary | ICD-10-CM | POA: Diagnosis not present

## 2019-05-29 LAB — CBC WITH DIFFERENTIAL/PLATELET
Absolute Monocytes: 428 cells/uL (ref 200–950)
Basophils Absolute: 32 cells/uL (ref 0–200)
Basophils Relative: 0.5 %
Eosinophils Absolute: 69 cells/uL (ref 15–500)
Eosinophils Relative: 1.1 %
HCT: 37.3 % (ref 35.0–45.0)
Hemoglobin: 12.1 g/dL (ref 11.7–15.5)
Lymphs Abs: 1714 cells/uL (ref 850–3900)
MCH: 29.2 pg (ref 27.0–33.0)
MCHC: 32.4 g/dL (ref 32.0–36.0)
MCV: 89.9 fL (ref 80.0–100.0)
MPV: 11.4 fL (ref 7.5–12.5)
Monocytes Relative: 6.8 %
Neutro Abs: 4057 cells/uL (ref 1500–7800)
Neutrophils Relative %: 64.4 %
Platelets: 140 10*3/uL (ref 140–400)
RBC: 4.15 10*6/uL (ref 3.80–5.10)
RDW: 12.8 % (ref 11.0–15.0)
Total Lymphocyte: 27.2 %
WBC: 6.3 10*3/uL (ref 3.8–10.8)

## 2019-05-29 LAB — COMPLETE METABOLIC PANEL WITH GFR
AG Ratio: 1.6 (calc) (ref 1.0–2.5)
ALT: 8 U/L (ref 6–29)
AST: 11 U/L (ref 10–35)
Albumin: 4.1 g/dL (ref 3.6–5.1)
Alkaline phosphatase (APISO): 65 U/L (ref 37–153)
BUN: 7 mg/dL (ref 7–25)
CO2: 27 mmol/L (ref 20–32)
Calcium: 9.5 mg/dL (ref 8.6–10.4)
Chloride: 105 mmol/L (ref 98–110)
Creat: 0.6 mg/dL (ref 0.50–0.99)
GFR, Est African American: 108 mL/min/{1.73_m2} (ref >=60)
GFR, Est Non African American: 93 mL/min/{1.73_m2} (ref >=60)
Globulin: 2.5 g/dL (calc) (ref 1.9–3.7)
Glucose, Bld: 113 mg/dL (ref 65–139)
Potassium: 4 mmol/L (ref 3.5–5.3)
Sodium: 141 mmol/L (ref 135–146)
Total Bilirubin: 0.7 mg/dL (ref 0.2–1.2)
Total Protein: 6.6 g/dL (ref 6.1–8.1)

## 2019-05-29 LAB — URINALYSIS, COMPLETE
Bacteria, UA: NONE SEEN /HPF
Bilirubin Urine: NEGATIVE
Glucose, UA: NEGATIVE
Hyaline Cast: NONE SEEN /LPF
Ketones, ur: NEGATIVE
Leukocytes,Ua: NEGATIVE
Nitrite: NEGATIVE
Protein, ur: NEGATIVE
RBC / HPF: NONE SEEN /HPF (ref 0–2)
Specific Gravity, Urine: 1.005 (ref 1.001–1.03)
Squamous Epithelial / LPF: NONE SEEN /HPF (ref <=5)
WBC, UA: NONE SEEN /HPF (ref 0–5)
pH: 5.5 (ref 5.0–8.0)

## 2019-05-29 LAB — C-REACTIVE PROTEIN: CRP: 1.6 mg/L (ref <8.0)

## 2019-05-29 LAB — TSH: TSH: 1.52 mIU/L (ref 0.40–4.50)

## 2019-05-29 LAB — SEDIMENTATION RATE: Sed Rate: 9 mm/h (ref 0–30)

## 2019-06-04 ENCOUNTER — Telehealth: Payer: Self-pay

## 2019-06-04 NOTE — Telephone Encounter (Signed)
Stephanie Rollins called and states she was pulling her shirt over her head and felt a pain in her neck and heard cracking in her bones. She is wanting a neck x-ray. I did advise her she needs a visit. She declined.

## 2019-06-04 NOTE — Telephone Encounter (Signed)
I agree she needs an appointment.  It would be almost impossible to fracture urine while putting on a shirt, but I will be happy to see her.

## 2019-06-05 NOTE — Telephone Encounter (Signed)
Left message with recommendations.  

## 2019-06-06 DIAGNOSIS — R195 Other fecal abnormalities: Secondary | ICD-10-CM | POA: Diagnosis not present

## 2019-06-06 DIAGNOSIS — R634 Abnormal weight loss: Secondary | ICD-10-CM | POA: Diagnosis not present

## 2019-06-06 NOTE — Telephone Encounter (Signed)
Patient advised of recommendations.  

## 2019-06-08 LAB — OVA AND PARASITE EXAMINATION
CONCENTRATE RESULT:: NONE SEEN
MICRO NUMBER:: 670167
SPECIMEN QUALITY:: ADEQUATE
TRICHROME RESULT:: NONE SEEN

## 2019-06-23 ENCOUNTER — Encounter: Payer: Self-pay | Admitting: Family Medicine

## 2019-06-25 ENCOUNTER — Other Ambulatory Visit: Payer: Self-pay

## 2019-06-25 DIAGNOSIS — I480 Paroxysmal atrial fibrillation: Secondary | ICD-10-CM

## 2019-06-26 MED ORDER — METOPROLOL TARTRATE 25 MG PO TABS: 25.0000 mg | ORAL_TABLET | Freq: Two times a day (BID) | ORAL | 0 refills | Status: DC

## 2019-07-03 ENCOUNTER — Telehealth: Payer: Self-pay

## 2019-07-03 NOTE — Telephone Encounter (Signed)
Kryslyn called and wanted to know if Dr Madilyn Fireman ever saw her MyChart message. I advised her she really need to have an appointment to address all that she is wanting to address. She states she doesn't need an appointment that Dr Madilyn Fireman needs to read her MyChart message.     Lilly Cove to Hali Marry, MD      11:50 PM Dr. Madilyn Fireman,  I am still concerned about my weight loss. I had the blood work done on May 28, 2019, did anything show for cancer?  Would you check for possible Kidney and Bladder cancer and Pancreatic cancer on my CT scans and MRI? These are two areas I have problems with. I had a CT scan on Nov. 2, 2019 at [facility] ER. You have the disc at your office. It showed I have a kidney stone on the bottom of my right kidney. I had another CT scan on Dec. 9, 2019 at [facility] in [facility]. This one said I had a cyst on the bottom of my right kidney. I am having either bubbles or foaming in my urine sometimes. I still have abdominal pain when eating at times. These are symptoms of cancers. Thank you for your time and care. Earlie Server

## 2019-07-04 NOTE — Telephone Encounter (Signed)
Please call pt: she has no signs of cancer on her labs from June.  The CTs didn't show a bladder or kidney mass. If your urine is foaming we can send a urine microscopic review to check for sediment and we and check for protein.  I would still recommend colon cancer screening as wel discussed. CT doesn't pick up on that. Can consider cologuard.  I also recommend mammogram screening as well. Again I think most of your weight loss is related to your decreased appetite and swallowing problems and you should also follow back up with Dr. Bryan Lemma to rule out esophageal problems or cancer.

## 2019-07-05 NOTE — Telephone Encounter (Signed)
Called patient and advised to return call for instructions. KG LPN

## 2019-07-06 NOTE — Telephone Encounter (Signed)
Patient advised of providers note and recommendations. States she will consider mammogram (order already placed).   Patient denies cologuard, states she cant receive mail at this time but will call us when this is corrected so we can order.   No further questions or concerns at this time.

## 2019-08-29 ENCOUNTER — Telehealth: Payer: Self-pay

## 2019-08-29 NOTE — Telephone Encounter (Signed)
Patient called stating that her granddaughter that lives with her tested positive for COVID yesterday. Patient states she has had SX similar to sinus problems such as drainage, cough, fatigue, loss of taste and smell.   I advised patient to get a COVID test. Patient declined to go to [facility] for testing due to it being too far away, but states she will try to contact Urgent Care or a [facility] to be tested.   FYI to PCP

## 2019-09-04 ENCOUNTER — Encounter: Payer: Medicare Other | Admitting: Family Medicine

## 2019-09-20 ENCOUNTER — Telehealth: Payer: Self-pay | Admitting: Family Medicine

## 2019-09-20 ENCOUNTER — Encounter: Payer: Medicare Other | Admitting: Family Medicine

## 2019-09-20 NOTE — Telephone Encounter (Signed)
Patient called to cancel her Physical appointment today due to the weather. Patient will call back to reschedule  this appointment.

## 2019-09-24 ENCOUNTER — Ambulatory Visit (INDEPENDENT_AMBULATORY_CARE_PROVIDER_SITE_OTHER): Payer: Medicare Other | Admitting: Family Medicine

## 2019-09-24 ENCOUNTER — Other Ambulatory Visit: Payer: Self-pay

## 2019-09-24 DIAGNOSIS — Z23 Encounter for immunization: Secondary | ICD-10-CM

## 2019-09-26 ENCOUNTER — Other Ambulatory Visit: Payer: Self-pay

## 2019-09-26 DIAGNOSIS — I480 Paroxysmal atrial fibrillation: Secondary | ICD-10-CM

## 2019-09-26 MED ORDER — METOPROLOL TARTRATE 25 MG PO TABS: 25.0000 mg | ORAL_TABLET | Freq: Two times a day (BID) | ORAL | 0 refills | Status: DC

## 2019-10-05 ENCOUNTER — Encounter: Payer: Medicare Other | Admitting: Family Medicine

## 2019-10-26 ENCOUNTER — Ambulatory Visit: Payer: Medicare Other | Admitting: Family Medicine

## 2019-10-29 ENCOUNTER — Other Ambulatory Visit: Payer: Self-pay

## 2019-10-29 ENCOUNTER — Encounter: Payer: Self-pay | Admitting: Family Medicine

## 2019-10-29 ENCOUNTER — Ambulatory Visit (INDEPENDENT_AMBULATORY_CARE_PROVIDER_SITE_OTHER): Payer: Medicare Other | Admitting: Family Medicine

## 2019-10-29 ENCOUNTER — Telehealth: Payer: Self-pay | Admitting: Family Medicine

## 2019-10-29 VITALS — BP 139/72 | HR 64 | Ht 63.0 in | Wt 112.0 lb

## 2019-10-29 DIAGNOSIS — R82998 Other abnormal findings in urine: Secondary | ICD-10-CM | POA: Diagnosis not present

## 2019-10-29 DIAGNOSIS — R195 Other fecal abnormalities: Secondary | ICD-10-CM | POA: Diagnosis not present

## 2019-10-29 DIAGNOSIS — E43 Unspecified severe protein-calorie malnutrition: Secondary | ICD-10-CM | POA: Diagnosis not present

## 2019-10-29 DIAGNOSIS — R2240 Localized swelling, mass and lump, unspecified lower limb: Secondary | ICD-10-CM

## 2019-10-29 DIAGNOSIS — E348 Other specified endocrine disorders: Secondary | ICD-10-CM

## 2019-10-29 DIAGNOSIS — N281 Cyst of kidney, acquired: Secondary | ICD-10-CM | POA: Diagnosis not present

## 2019-10-29 DIAGNOSIS — M810 Age-related osteoporosis without current pathological fracture: Secondary | ICD-10-CM

## 2019-10-29 DIAGNOSIS — Z20828 Contact with and (suspected) exposure to other viral communicable diseases: Secondary | ICD-10-CM

## 2019-10-29 DIAGNOSIS — I480 Paroxysmal atrial fibrillation: Secondary | ICD-10-CM

## 2019-10-29 DIAGNOSIS — Z20822 Contact with and (suspected) exposure to covid-19: Secondary | ICD-10-CM

## 2019-10-29 LAB — POCT URINALYSIS DIP (CLINITEK)
Bilirubin, UA: NEGATIVE
Glucose, UA: NEGATIVE mg/dL
Ketones, POC UA: NEGATIVE mg/dL
Leukocytes, UA: NEGATIVE
Nitrite, UA: NEGATIVE
POC PROTEIN,UA: NEGATIVE
Spec Grav, UA: 1.01 (ref 1.010–1.025)
Urobilinogen, UA: 0.2 E.U./dL
pH, UA: 6 (ref 5.0–8.0)

## 2019-10-29 NOTE — Progress Notes (Addendum)
Established Patient Office Visit  Subjective:  Patient ID: Stephanie Rollins, female    DOB: [birth year]  Age: 68 y.o. MRN: [id number]  CC:  Chief Complaint  Patient presents with  . Follow-up    HPI Stephanie Rollins presents for    F/U Afib - she is doing well overall. She hasn't been able to get back in with Dr. Stanford Breed.  She said she called and spoken to the nurse because she is pretty sure that she had an episode of SVT that lasted for about 30 minutes.  But she was unable to get an appointment.  She is worried about her kidneys.  She saw noted on a CT that she had done in December of last year that she had a 1.6 cm renal cyst.  She is worried that that is actually causing some of her symptoms.  She also wanted to discuss that she feels like she has been getting some swelling in her lower legs and at times even her arms.  She thinks the swelling in her lower legs is probably coming from her sciatic nerve which occasionally gets inflamed particularly on the right side.  She says sometimes she will get back pain with coughing or sneezing sitting for prolonged periods or sometimes even with a bowel movement to daily in the right low back.  She says the swelling seems to come and go.    She has had some redness on her foot since October.  She is not sure what may have caused this.  She is not having any itching or irritation.  But she is also noticed a lot more spider veins popping up on her ankles as well.  He is also concerned because she feels like her skin looks more creamy instead of white.  She is very worried about the possibility of pancreatic or renal cancer.  She is also noticed some bubbles in her urine that is been coming and going as well.  She also reports yellow stools that have been going on for about 6 to 7 months intermittently.  Sometimes they are normal but sometimes they appear yellow or even just very pale.  She does have a gallbladder she denies constipation or  blood in the stool.  She wondered if initially it was from her diet as she eats a lot of mac & cheese but says she backed off on that and was still noticing some yellow stools.   She has been purposely trying to eat more and has gained some weight.  She was 98 pounds back in May and she is up to 112.  Is really tried hard to increase her protein.  She said she did not notice the change in her bowels and still she started really increasing her daily nutrition.  He would also like to have antibody testing for Covid.  She says her daughter and granddaughter who live with her actually tested positive for Covid.  She herself experienced about 3 to 4 days of some chest heaviness and headache but otherwise was well.    Past Medical History:  Diagnosis Date  . Anal fistula   . Atrial fibrillation ([place])   . Dyslipidemia   . Hearing loss    75%  . Hypertension   . IBS (irritable bowel syndrome)    remote history  . Interstitial cystitis   . Kidney stone   . Menopausal state   . MVP (mitral valve prolapse)   . Paroxysmal A-fib ([place])  status post ablation  . Swallowing difficulty    problems for second/need dentures    Past Surgical History:  Procedure Laterality Date  . CARDIAC ELECTROPHYSIOLOGY MAPPING AND ABLATION     for A fib  . [place]   x 2  . NASAL SEPTUM SURGERY  1977  . pulmonary vein isolation  06-2006   procedure for AFIB /[facility]    Family History  Problem Relation Age of Onset  . Hypertension Mother   . Diabetes Maternal Grandmother   . Heart disease Brother   . Mastocytosis Brother   . Stroke Paternal Aunt   . Heart attack Father   . Heart disease Sister        x 2  . Colon cancer Neg Hx   . Esophageal cancer Neg Hx     Social History   Socioeconomic History  . Marital status: Divorced    Spouse name: Not on file  . Number of children: 2  . Years of education: Not on file  . Highest education level: Not on file   Occupational History  . Occupation: retired  Scientific laboratory technician  . Financial resource strain: Not on file  . Food insecurity    Worry: Not on file    Inability: Not on file  . Transportation needs    Medical: Not on file    Non-medical: Not on file  Tobacco Use  . Smoking status: Never Smoker  . Smokeless tobacco: Never Used  Substance and Sexual Activity  . Alcohol use: No  . Drug use: No  . Sexual activity: Not on file    Comment: unemployed, GED, divorced,2 adult children, no caffeine, no regular exercise  Lifestyle  . Physical activity    Days per week: Not on file    Minutes per session: Not on file  . Stress: Not on file  Relationships  . Social Herbalist on phone: Not on file    Gets together: Not on file    Attends religious service: Not on file    Active member of club or organization: Not on file    Attends meetings of clubs or organizations: Not on file    Relationship status: Not on file  . Intimate partner violence    Fear of current or ex partner: Not on file    Emotionally abused: Not on file    Physically abused: Not on file    Forced sexual activity: Not on file  Other Topics Concern  . Not on file  Social History Narrative  . Not on file    Outpatient Medications Prior to Visit  Medication Sig Dispense Refill  . aspirin 81 MG chewable tablet Chew 81 mg by mouth daily.     Marland Kitchen CALCIUM-VITAMIN D PO Take by mouth.    . metoprolol tartrate (LOPRESSOR) 25 MG tablet Take 1 tablet (25 mg total) by mouth 2 (two) times daily. PLEASE SCHEDULE AN APPT FOR FUTURE REFILLS 180 tablet 0   No facility-administered medications prior to visit.     Allergies  Allergen Reactions  . Amoxicillin Rash and Other (See Comments)  . Ampicillin   . Azithromycin     REACTION: SOB  . Cephalexin   . Clarithromycin   . Codeine   . Flecainide   . Penicillins     REACTION: rash  . Shellfish Allergy     Sick to stomach   . Shellfish-Derived Products Hives and  Nausea And Vomiting  ROS Review of Systems    Objective:    Physical Exam  Constitutional: She is oriented to person, place, and time. She appears well-developed and well-nourished.  HENT:  Head: Normocephalic and atraumatic.  Cardiovascular: Normal rate, regular rhythm and normal heart sounds.  Pulmonary/Chest: Effort normal and breath sounds normal.  Musculoskeletal:     Comments: No evidence of lower leg swelling or arm swelling.  Neurological: She is alert and oriented to person, place, and time.  Skin: Skin is warm and dry.  He does have some varicose veins around both ankles.  The skin on her outer feet looks a little bit pink but I do not see any distinct rash no sign of infection.  Psychiatric: She has a normal mood and affect. Her behavior is normal.    BP 139/72   Pulse 64   Ht _0  (1.6 m)   Wt 112 lb (50.8 kg)   SpO2 99%   BMI 19.84 kg/m  Wt Readings from Last 3 Encounters:  10/29/19 112 lb (50.8 kg)  04/17/19 98 lb 6.4 oz (44.6 kg)  02/16/19 103 lb (46.7 kg)     Health Maintenance Due  Topic Date Due  . MAMMOGRAM  10/03/2014    There are no preventive care reminders to display for this patient.  Lab Results  Component Value Date   TSH 1.52 05/28/2019   Lab Results  Component Value Date   WBC 6.3 05/28/2019   HGB 12.1 05/28/2019   HCT 37.3 05/28/2019   MCV 89.9 05/28/2019   PLT 140 05/28/2019   Lab Results  Component Value Date   NA 141 05/28/2019   K 4.0 05/28/2019   CO2 27 05/28/2019   GLUCOSE 113 05/28/2019   BUN 7 05/28/2019   CREATININE 0.60 05/28/2019   BILITOT 0.7 05/28/2019   ALKPHOS 83 10/30/2018   AST 11 05/28/2019   ALT 8 05/28/2019   PROT 6.6 05/28/2019   ALBUMIN 4.8 10/30/2018   CALCIUM 9.5 05/28/2019   ANIONGAP 9 10/30/2018   Lab Results  Component Value Date   CHOL 196 09/20/2017   Lab Results  Component Value Date   HDL 70 09/20/2017   Lab Results  Component Value Date   LDLCALC 103 (H) 09/20/2017    Lab Results  Component Value Date   TRIG 132 09/20/2017   Lab Results  Component Value Date   CHOLHDL 2.8 09/20/2017   Lab Results  Component Value Date   HGBA1C 5.4 02/16/2019      Assessment & Plan:   Problem List Items Addressed This Visit      Cardiovascular and Mediastinum   ATRIAL FIBRILLATION    Stable.  Continue with metoprolol.  We will work on trying to get her in with Dr. Kirk Ruths.  She does not have an upcoming appointment scheduled.      Relevant Orders   COMPLETE METABOLIC PANEL WITH GFR   CBC   Lipase   Lipid Panel w/reflex Direct LDL   SARS CoV2 Serology(COVID19) AB(IgG,IgM),Immunoassay     Endocrine   INSULIN RESISTANCE SYNDROME - Primary    Follow every 6 to 12 months.      Relevant Orders   COMPLETE METABOLIC PANEL WITH GFR   CBC   Lipase   Lipid Panel w/reflex Direct LDL   SARS CoV2 Serology(COVID19) AB(IgG,IgM),Immunoassay     Musculoskeletal and Integument   Age related osteoporosis   Relevant Orders   COMPLETE METABOLIC PANEL WITH GFR   CBC  Lipase   Lipid Panel w/reflex Direct LDL   SARS CoV2 Serology(COVID19) AB(IgG,IgM),Immunoassay     Genitourinary   Renal cyst, right    Reassured her that the cyst is not causing any of her urinary symptoms.  Certainly if she is concerned we can always get an ultrasound it has been a year so we can just make sure that there are no changes but I reassured her that it was a simple cyst and not complex and its measuring less than 2 cm.      Relevant Orders   US Renal     Other   Severe protein-calorie malnutrition ([place])    She is doing much better.  She is actually up 14 pounds over the last 6 months by increasing her nutrition.  Denies any constipation.       Other Visit Diagnoses    Exposure to COVID-19 virus       Relevant Orders   COMPLETE METABOLIC PANEL WITH GFR   CBC   Lipase   Lipid Panel w/reflex Direct LDL   SARS CoV2 Serology(COVID19) AB(IgG,IgM),Immunoassay   Foamy  urine       Relevant Orders   POCT URINALYSIS DIP (CLINITEK) (Completed)   Abnormal stool color       Localized swelling of lower leg         Covid exposure-we will check antibodies per patient request.  Foamy urine-we will perform urinalysis today.  Pale stools-we will evaluate for hyperbilirubinemia.  She is not had any pruritus.  Will call once we have lab results back. Consider further work up for [employer].    Lower leg swelling-no sign of swelling on exam today.  She does have some spider veins around her ankles we did discuss wearing more supportive shoe wear and what she typically wears into the office which is a very loose fabric sandal.  No orders of the defined types were placed in this encounter.   Follow-up: Return in about 6 months (around 04/28/2020) for Afib, etc.   Time spent 50 min with > 50% spent face to face in counseling about health stools, follow-up foamy urine, lower leg swelling, Covid exposure, atrial fibrillation, spider veins, right renal cyst.  Beatrice Lecher, MD

## 2019-10-29 NOTE — Assessment & Plan Note (Signed)
She is doing much better.  She is actually up 14 pounds over the last 6 months by increasing her nutrition.  Denies any constipation.

## 2019-10-29 NOTE — Assessment & Plan Note (Signed)
Follow every 6 to 12 months.

## 2019-10-29 NOTE — Assessment & Plan Note (Signed)
Reassured her that the cyst is not causing any of her urinary symptoms.  Certainly if she is concerned we can always get an ultrasound it has been a year so we can just make sure that there are no changes but I reassured her that it was a simple cyst and not complex and its measuring less than 2 cm.

## 2019-10-29 NOTE — Assessment & Plan Note (Signed)
Stable.  Continue with metoprolol.  We will work on trying to get her in with Dr. Kirk Ruths.  She does not have an upcoming appointment scheduled.

## 2019-10-29 NOTE — Telephone Encounter (Signed)
Please call and get her scheduled with Dr. Stanford Breed for her regular follow-up for A. fib.  She is overdue.  She said she called and spoke to a nurse there and she was told that they would get back in touch with her and no one ever called her back.

## 2019-10-30 LAB — LIPID PANEL W/REFLEX DIRECT LDL
Cholesterol: 240 mg/dL — ABNORMAL HIGH (ref <200)
HDL: 79 mg/dL (ref >=50)
LDL Cholesterol (Calc): 140 mg/dL (calc) — ABNORMAL HIGH
Non-HDL Cholesterol (Calc): 161 mg/dL (calc) — ABNORMAL HIGH (ref <130)
Total CHOL/HDL Ratio: 3 (calc) (ref <5.0)
Triglycerides: 97 mg/dL (ref <150)

## 2019-10-30 LAB — COMPLETE METABOLIC PANEL WITH GFR
AG Ratio: 1.5 (calc) (ref 1.0–2.5)
ALT: 12 U/L (ref 6–29)
AST: 17 U/L (ref 10–35)
Albumin: 4.5 g/dL (ref 3.6–5.1)
Alkaline phosphatase (APISO): 79 U/L (ref 37–153)
BUN: 11 mg/dL (ref 7–25)
CO2: 27 mmol/L (ref 20–32)
Calcium: 10 mg/dL (ref 8.6–10.4)
Chloride: 102 mmol/L (ref 98–110)
Creat: 0.62 mg/dL (ref 0.60–0.93)
GFR, Est African American: 106 mL/min/{1.73_m2} (ref >=60)
GFR, Est Non African American: 91 mL/min/{1.73_m2} (ref >=60)
Globulin: 3.1 g/dL (calc) (ref 1.9–3.7)
Glucose, Bld: 99 mg/dL (ref 65–99)
Potassium: 3.9 mmol/L (ref 3.5–5.3)
Sodium: 141 mmol/L (ref 135–146)
Total Bilirubin: 0.9 mg/dL (ref 0.2–1.2)
Total Protein: 7.6 g/dL (ref 6.1–8.1)

## 2019-10-30 LAB — CBC
HCT: 41.1 % (ref 35.0–45.0)
Hemoglobin: 13.5 g/dL (ref 11.7–15.5)
MCH: 29.2 pg (ref 27.0–33.0)
MCHC: 32.8 g/dL (ref 32.0–36.0)
MCV: 89 fL (ref 80.0–100.0)
MPV: 11.7 fL (ref 7.5–12.5)
Platelets: 133 10*3/uL — ABNORMAL LOW (ref 140–400)
RBC: 4.62 10*6/uL (ref 3.80–5.10)
RDW: 12.4 % (ref 11.0–15.0)
WBC: 7.9 10*3/uL (ref 3.8–10.8)

## 2019-10-30 LAB — LIPASE: Lipase: 30 U/L (ref 7–60)

## 2019-10-30 LAB — SARS COV-2 SEROLOGY(COVID-19)AB(IGG,IGM),IMMUNOASSAY
SARS CoV-2 AB IgG: NEGATIVE
SARS CoV-2 IgM: NEGATIVE

## 2019-10-31 NOTE — Telephone Encounter (Signed)
Patient has been scheduled for February 24th at 3 pm. I will call patient in the morning.    Please leave message in my basket.

## 2019-11-01 NOTE — Telephone Encounter (Signed)
Left message advising of recommendations and the call back number.

## 2019-11-02 NOTE — Telephone Encounter (Signed)
The patient called back and she is appreciative of you getting her a sooner appointment with Dr. Stanford Breed. No other questions at this time.

## 2019-11-06 ENCOUNTER — Other Ambulatory Visit: Payer: Self-pay

## 2019-11-06 ENCOUNTER — Ambulatory Visit (INDEPENDENT_AMBULATORY_CARE_PROVIDER_SITE_OTHER): Payer: Medicare Other

## 2019-11-06 DIAGNOSIS — N281 Cyst of kidney, acquired: Secondary | ICD-10-CM

## 2019-11-09 ENCOUNTER — Telehealth: Payer: Self-pay | Admitting: Family Medicine

## 2019-11-09 NOTE — Telephone Encounter (Signed)
Patient calls and states still having the frequent urination during the night and keeping her awake at night. What could be the next step. Please advise. KG LPN

## 2019-11-12 NOTE — Telephone Encounter (Signed)
Neck step would be to try an overactive medication and see if that actually helps.  We can try for 30 days if she would like.  We would use Ditropan or Detrol or something like that.

## 2019-11-13 NOTE — Telephone Encounter (Signed)
Called and left pt msg asking her to let us know if she is ok to start overactive medication

## 2019-12-24 ENCOUNTER — Other Ambulatory Visit: Payer: Self-pay | Admitting: Internal Medicine

## 2019-12-24 DIAGNOSIS — I480 Paroxysmal atrial fibrillation: Secondary | ICD-10-CM

## 2020-01-02 ENCOUNTER — Telehealth: Payer: Self-pay | Admitting: Cardiology

## 2020-01-02 NOTE — Telephone Encounter (Signed)
Patient called to get her monitor report from March - Stephanie Rollins has printed it out in [place] location - is ready for pick up on her appt day.   Thanks

## 2020-01-14 NOTE — Progress Notes (Signed)
HPI: FU SVT as well as atrial fibrillation and mitral valve prolapse. Cardiac catheterization in September of 2002 showed normal coronary arteries and normal LV function with an ejection fraction of 65%. Myoview in 2007 showed EF 70 and normal perfusion. She has had a previous ablation of her atrial fibrillation in August 2007 at [facility]. She has also had recurrent palpitations secondary to PVCs treated with beta blockade. Event monitor 12/14 showed sinus with brief PAT. Monitor January 2020 showed sinus rhythm with PACs, PVCs, brief PAT and 4/7 beat runs of nonsustained ventricular tachycardia.   Echocardiogram March 2020 showed normal LV function.  Since I last saw her she occasional dyspnea, occasional chest heaviness but no exertional chest pain.  She continues to have intermittent palpitations.  No syncope.  Current Outpatient Medications  Medication Sig Dispense Refill  . aspirin 81 MG chewable tablet Chew 81 mg by mouth daily.     Marland Kitchen CALCIUM-VITAMIN D PO Take by mouth.    . metoprolol tartrate (LOPRESSOR) 25 MG tablet Take 1 tablet (25 mg total) by mouth 2 (two) times daily. Please keep upcoming appt in Feb for further refills 120 tablet 0   No current facility-administered medications for this visit.     Past Medical History:  Diagnosis Date  . Anal fistula   . Atrial fibrillation ([place])   . Dyslipidemia   . Hearing loss    75%  . Hypertension   . IBS (irritable bowel syndrome)    remote history  . Interstitial cystitis   . Kidney stone   . Menopausal state   . MVP (mitral valve prolapse)   . Paroxysmal A-fib ([facility])    status post ablation  . Swallowing difficulty    problems for second/need dentures    Past Surgical History:  Procedure Laterality Date  . CARDIAC ELECTROPHYSIOLOGY MAPPING AND ABLATION     for A fib  . [place]   x 2  . NASAL SEPTUM SURGERY  1977  . pulmonary vein isolation  06-2006   procedure for AFIB /[facility]  Cardiology    Social History   Socioeconomic History  . Marital status: Divorced    Spouse name: Not on file  . Number of children: 2  . Years of education: Not on file  . Highest education level: Not on file  Occupational History  . Occupation: retired  Tobacco Use  . Smoking status: Never Smoker  . Smokeless tobacco: Never Used  Substance and Sexual Activity  . Alcohol use: No  . Drug use: No  . Sexual activity: Not on file    Comment: unemployed, GED, divorced,2 adult children, no caffeine, no regular exercise  Other Topics Concern  . Not on file  Social History Narrative  . Not on file   Social Determinants of Health   Financial Resource Strain:   . Difficulty of Paying Living Expenses: Not on file  Food Insecurity:   . Worried About Charity fundraiser in the Last Year: Not on file  . Ran Out of Food in the Last Year: Not on file  Transportation Needs:   . Lack of Transportation (Medical): Not on file  . Lack of Transportation (Non-Medical): Not on file  Physical Activity:   . Days of Exercise per Week: Not on file  . Minutes of Exercise per Session: Not on file  Stress:   . Feeling of Stress : Not on file  Social Connections:   .  Frequency of Communication with Friends and Family: Not on file  . Frequency of Social Gatherings with Friends and Family: Not on file  . Attends Religious Services: Not on file  . Active Member of Clubs or Organizations: Not on file  . Attends Banker Meetings: Not on file  . Marital Status: Not on file  Intimate Partner Violence:   . Fear of Current or Ex-Partner: Not on file  . Emotionally Abused: Not on file  . Physically Abused: Not on file  . Sexually Abused: Not on file    Family History  Problem Relation Age of Onset  . Hypertension Mother   . Diabetes Maternal Grandmother   . Heart disease Brother   . Mastocytosis Brother   . Stroke Paternal Aunt   . Heart attack Father   . Heart disease Sister         x 2  . Colon cancer Neg Hx   . Esophageal cancer Neg Hx     ROS: no fevers or chills, productive cough, hemoptysis, dysphasia, odynophagia, melena, hematochezia, dysuria, hematuria, rash, seizure activity, orthopnea, PND, pedal edema, claudication. Remaining systems are negative.  Physical Exam: Well-developed well-nourished in no acute distress.  Skin is warm and dry.  HEENT is normal.  Neck is supple.  Chest is clear to auscultation with normal expansion.  Cardiovascular exam is regular rate and rhythm.  Abdominal exam nontender or distended. No masses palpated. Extremities show no edema. neuro grossly intact  ECG-sinus rhythm at a rate of 64, no ST changes.  Personally reviewed  A/P  1 history of atrial fibrillation-status post prior ablation.  There have been no documented recurrences.  Continue beta-blocker and aspirin.  2 palpitations-patient continues to have chronic palpitations.  Continue beta-blocker.  3 mitral valve prolapse-not evident on most recent echocardiogram.  4 history of chest pain-no recurrences recently.  Olga Millers, MD

## 2020-01-16 ENCOUNTER — Encounter: Payer: Self-pay | Admitting: Cardiology

## 2020-01-16 ENCOUNTER — Other Ambulatory Visit: Payer: Self-pay

## 2020-01-16 ENCOUNTER — Ambulatory Visit (INDEPENDENT_AMBULATORY_CARE_PROVIDER_SITE_OTHER): Payer: Medicare Other | Admitting: Cardiology

## 2020-01-16 VITALS — BP 120/74 | HR 64 | Ht 63.0 in | Wt 120.1 lb

## 2020-01-16 DIAGNOSIS — R002 Palpitations: Secondary | ICD-10-CM | POA: Diagnosis not present

## 2020-01-16 DIAGNOSIS — I341 Nonrheumatic mitral (valve) prolapse: Secondary | ICD-10-CM | POA: Diagnosis not present

## 2020-01-16 DIAGNOSIS — I480 Paroxysmal atrial fibrillation: Secondary | ICD-10-CM | POA: Diagnosis not present

## 2020-01-16 NOTE — Patient Instructions (Signed)

## 2020-02-20 ENCOUNTER — Other Ambulatory Visit: Payer: Self-pay | Admitting: Internal Medicine

## 2020-02-20 DIAGNOSIS — I480 Paroxysmal atrial fibrillation: Secondary | ICD-10-CM

## 2020-04-07 ENCOUNTER — Other Ambulatory Visit: Payer: Self-pay | Admitting: Family Medicine

## 2020-04-07 DIAGNOSIS — Z1231 Encounter for screening mammogram for malignant neoplasm of breast: Secondary | ICD-10-CM

## 2020-04-08 ENCOUNTER — Telehealth: Payer: Self-pay

## 2020-04-08 DIAGNOSIS — M810 Age-related osteoporosis without current pathological fracture: Secondary | ICD-10-CM

## 2020-04-08 NOTE — Telephone Encounter (Signed)
Patient has a diagnoses of osteoporosis, I ordered DEXA

## 2020-04-08 NOTE — Telephone Encounter (Signed)
Denita called and left a message for an order for a Dexa Scan. The last scan I can find was from 2013. Please advise.

## 2020-04-09 ENCOUNTER — Ambulatory Visit (INDEPENDENT_AMBULATORY_CARE_PROVIDER_SITE_OTHER): Payer: Medicare Other

## 2020-04-09 ENCOUNTER — Other Ambulatory Visit: Payer: Self-pay

## 2020-04-09 DIAGNOSIS — Z1231 Encounter for screening mammogram for malignant neoplasm of breast: Secondary | ICD-10-CM

## 2020-04-09 DIAGNOSIS — M810 Age-related osteoporosis without current pathological fracture: Secondary | ICD-10-CM

## 2020-04-09 DIAGNOSIS — M8589 Other specified disorders of bone density and structure, multiple sites: Secondary | ICD-10-CM | POA: Diagnosis not present

## 2020-04-11 ENCOUNTER — Other Ambulatory Visit: Payer: Self-pay | Admitting: Family Medicine

## 2020-04-11 DIAGNOSIS — R928 Other abnormal and inconclusive findings on diagnostic imaging of breast: Secondary | ICD-10-CM

## 2020-04-28 ENCOUNTER — Other Ambulatory Visit: Payer: Medicare Other

## 2020-05-15 ENCOUNTER — Emergency Department (INDEPENDENT_AMBULATORY_CARE_PROVIDER_SITE_OTHER): Payer: Medicare Other

## 2020-05-15 ENCOUNTER — Other Ambulatory Visit: Payer: Self-pay

## 2020-05-15 ENCOUNTER — Emergency Department (INDEPENDENT_AMBULATORY_CARE_PROVIDER_SITE_OTHER)
Admission: EM | Admit: 2020-05-15 | Discharge: 2020-05-15 | Disposition: A | Discharge status: Home / Self Care | Payer: Medicare Other | Source: Home / Self Care

## 2020-05-15 ENCOUNTER — Encounter: Payer: Self-pay | Admitting: Emergency Medicine

## 2020-05-15 DIAGNOSIS — R000 Tachycardia, unspecified: Secondary | ICD-10-CM

## 2020-05-15 DIAGNOSIS — R05 Cough: Secondary | ICD-10-CM

## 2020-05-15 DIAGNOSIS — R059 Cough, unspecified: Secondary | ICD-10-CM

## 2020-05-15 MED ORDER — DOXYCYCLINE HYCLATE 100 MG PO CAPS: 100.0000 mg | ORAL_CAPSULE | Freq: Two times a day (BID) | ORAL | 0 refills | Status: DC

## 2020-05-15 NOTE — Discharge Instructions (Signed)
°  Call tomorrow to schedule a follow up appointment with primary care next week for recheck of symptoms.   Call 911 or go to the hospital if symptoms worsening- chest pain, trouble breathing, dizziness, passing out, etc.

## 2020-05-15 NOTE — ED Provider Notes (Signed)
[name]    CSN: [id number] Arrival date & time: 05/15/20  1522      History   Chief Complaint Chief Complaint  Patient presents with  . Cough    HPI Stephanie Rollins is a 69 y.o. female.   HPI Stephanie Rollins is a 69 y.o. female presenting to UC with c/o mildly productive cough for 2 weeks, elevated HR in the 90s, usually in the 60s for pt due to being on metoprolol, associated elevated temp of 98*F, pt states she is usually about 96*F.  She has not received the Covid vaccine. No known sick contacts. She does have multiple drug allergies. She has not been taking anything for her current symptoms. Denies chest pain or SOB. Denies leg pain or swelling. No hx of clots. She is on aspirin due to hx of paroxysmal afib.      Past Medical History:  Diagnosis Date  . Anal fistula   . Atrial fibrillation ([place])   . Dyslipidemia   . Hearing loss    75%  . Hypertension   . IBS (irritable bowel syndrome)    remote history  . Interstitial cystitis   . Kidney stone   . Menopausal state   . MVP (mitral valve prolapse)   . Paroxysmal A-fib ([facility])    status post ablation  . Swallowing difficulty    problems for second/need dentures    Patient Active Problem List   Diagnosis Date Noted  . Renal cyst, right 10/29/2019  . Spondylosis of lumbar spine 02/10/2019  . Chronic pain disorder 02/10/2019  . Age related osteoporosis 02/10/2019  . Severe protein-calorie malnutrition ([place]) 01/08/2019  . Compression fracture of fifth lumbar vertebra (H[facility]) 10/31/2018  . Hearing loss 09/20/2017  . Non-traumatic compression fracture of T7 thoracic vertebra, sequela 12/21/2016  . Osteoarthritis of both hands 05/03/2016  . Mitral valve prolapse 08/07/2014  . PREMATURE VENTRICULAR CONTRACTIONS 10/07/2010  . GERD 11/13/2008  . Osteoporosis 10/15/2008  . INSULIN RESISTANCE SYNDROME 09/25/2008  . ECZEMA 12/29/2007  . CYSTITIS, CHRONIC INTERSTITIAL 11/17/2006  . KNEE PAIN, BILATERAL  10/24/2006  . HYPERCHOLESTEROLEMIA 08/30/2006  . ANXIETY 08/30/2006  . ATRIAL FIBRILLATION 08/30/2006  . RHINITIS, ALLERGIC 08/30/2006    Past Surgical History:  Procedure Laterality Date  . CARDIAC ELECTROPHYSIOLOGY MAPPING AND ABLATION     for A fib  . [place]   x 2  . NASAL SEPTUM SURGERY  1977  . pulmonary vein isolation  06-2006   procedure for AFIB /[facility]    OB History   No obstetric history on file.      Home Medications    Prior to Admission medications   Medication Sig Start Date End Date Taking? Authorizing Provider  aspirin 81 MG chewable tablet Chew 81 mg by mouth daily.     [provider]  CALCIUM-VITAMIN D PO Take by mouth.    [provider]  doxycycline (VIBRAMYCIN) 100 MG capsule Take 1 capsule (100 mg total) by mouth 2 (two) times daily. One po bid x 7 days 05/15/20   Noe Gens, PA-C  metoprolol tartrate (LOPRESSOR) 25 MG tablet TAKE 1 TABLET BY MOUTH TWICE DAILY. 02/20/20   Lelon Perla, MD    Family History Family History  Problem Relation Age of Onset  . Hypertension Mother   . Diabetes Maternal Grandmother   . Heart disease Brother   . Mastocytosis Brother   . Stroke Paternal Aunt   . Heart  attack Father   . Heart disease Sister        x 2  . Colon cancer Neg Hx   . Esophageal cancer Neg Hx     Social History Social History   Tobacco Use  . Smoking status: Never Smoker  . Smokeless tobacco: Never Used  Vaping Use  . Vaping Use: Never used  Substance Use Topics  . Alcohol use: No  . Drug use: No     Allergies   Amoxicillin, Ampicillin, Azithromycin, Cephalexin, Clarithromycin, Codeine, Flecainide, Penicillins, Shellfish allergy, and Shellfish-derived products   Review of Systems Review of Systems  Constitutional: Positive for fever ( "elevated temp"). Negative for chills.  HENT: Positive for congestion. Negative for ear pain, sore throat, trouble swallowing and voice  change.   Respiratory: Positive for cough. Negative for shortness of breath.   Cardiovascular: Positive for palpitations (elevated heart rate). Negative for chest pain.  Gastrointestinal: Negative for abdominal pain, diarrhea, nausea and vomiting.  Musculoskeletal: Negative for arthralgias, back pain and myalgias.  Skin: Negative for rash.  All other systems reviewed and are negative.    Physical Exam Triage Vital Signs ED Triage Vitals  Enc Vitals Group     BP 05/15/20 1556 125/72     Pulse Rate 05/15/20 1556 94     Resp --      Temp 05/15/20 1556 98.7 F (37.1 C)     Temp Source 05/15/20 1556 Oral     SpO2 05/15/20 1556 96 %     Weight 05/15/20 1559 126 lb (57.2 kg)     Height 05/15/20 1559 5' (1.524 m)     Head Circumference --      Peak Rollins --      Pain Score 05/15/20 1559 0     Pain Loc --      Pain Edu? --      Excl. in GC? --    No data found.  Updated Vital Signs BP 125/72 (BP Location: Right Arm)   Pulse 94   Temp 98.7 F (37.1 C) (Oral)   Ht 5' (1.524 m)   Wt 126 lb (57.2 kg)   SpO2 96%   BMI 24.61 kg/m   Visual Acuity Right Eye Distance:   Left Eye Distance:   Bilateral Distance:    Right Eye Near:   Left Eye Near:    Bilateral Near:     Physical Exam Vitals and nursing note reviewed.  Constitutional:      General: She is not in acute distress.    Appearance: Normal appearance. She is well-developed. She is not ill-appearing, toxic-appearing or diaphoretic.  HENT:     Head: Normocephalic and atraumatic.     Right Ear: Tympanic membrane and ear canal normal.     Left Ear: Tympanic membrane and ear canal normal.     Nose: Nose normal.     Mouth/Throat:     Lips: Pink.     Mouth: Mucous membranes are moist.     Pharynx: Oropharynx is clear. Uvula midline.  Cardiovascular:     Rate and Rhythm: Normal rate and regular rhythm.  Pulmonary:     Effort: Pulmonary effort is normal. No respiratory distress.     Breath sounds: Normal breath  sounds. No stridor. No wheezing, rhonchi or rales.  Musculoskeletal:        General: Normal range of motion.     Cervical back: Normal range of motion.  Skin:    General: Skin is warm and  dry.  Neurological:     Mental Status: She is alert and oriented to person, place, and time.  Psychiatric:        Behavior: Behavior normal.      UC Treatments / Results  Labs (all labs ordered are listed, but only abnormal results are displayed) Labs Reviewed  SARS-COV-2 RNA,(COVID-19) QUALITATIVE NAAT  D-DIMER, QUANTITATIVE (NOT AT [facility])  POCT CBC W AUTO DIFF ([facility])    EKG   Radiology DG Chest 2 View  Result Date: 05/15/2020 CLINICAL DATA:  Cough and congestion. EXAM: CHEST - 2 VIEW COMPARISON:  May 28, 2019 FINDINGS: The lungs are hyperinflated. Mild diffuse chronic appearing increased lung markings are seen. Very mild atelectasis is noted within the bilateral lung bases. Very mild biapical scarring and/or atelectasis is present. There is no evidence of a pleural effusion or pneumothorax. The heart size and mediastinal contours are within normal limits. There is tortuosity of the descending thoracic aorta. Degenerative changes seen throughout the thoracic spine. IMPRESSION: 1. Very mild bibasilar atelectasis. 2. Chronic appearing increased lung markings with very mild biapical scarring and/or atelectasis. Electronically Signed   By: Aram Candela M.D.   On: 05/15/2020 16:40    Procedures Procedures (including critical care time)  Medications Ordered in UC Medications - No data to display  Initial Impression / Assessment and Plan / UC Course  I have reviewed the triage vital signs and the nursing notes.  Pertinent labs & imaging results that were available during my care of the patient were reviewed by me and considered in my medical decision making (see chart for details).    CBC: WBC 11.3 Due to report of elevated heart rate, discussed pt with Dr. Linford Arnold, pt's  PCP. Will get D-dimer, however, pt is low risk for DVT/PE. Pt safe for discharge home at this time. Will have close f/u tomorrow with results called to pt when they come in. Although no known exposure to Covid, pt is not vaccinated, would like to r/o Covid. PCR test- pending.  Discussed symptoms that warrant emergent care in the ED. AVS given  Final Clinical Impressions(s) / UC Diagnoses   Final diagnoses:  Increased heart rate  Cough     Discharge Instructions      Call tomorrow to schedule a follow up appointment with primary care next week for recheck of symptoms.   Call 911 or go to the hospital if symptoms worsening- chest pain, trouble breathing, dizziness, passing out, etc.     ED Prescriptions    Medication Sig Dispense Auth. Provider   doxycycline (VIBRAMYCIN) 100 MG capsule Take 1 capsule (100 mg total) by mouth 2 (two) times daily. One po bid x 7 days 14 capsule Lurene Shadow, [place]     PDMP not reviewed this encounter.   Lurene Shadow, [place] 05/15/20 1830

## 2020-05-15 NOTE — ED Triage Notes (Signed)
Cough x 2 weeks increased HR

## 2020-05-16 LAB — D-DIMER, QUANTITATIVE: D-Dimer, Quant: 0.8 mcg/mL FEU — ABNORMAL HIGH (ref <0.50)

## 2020-05-17 ENCOUNTER — Telehealth: Payer: Self-pay | Admitting: Emergency Medicine

## 2020-05-17 LAB — SARS-COV-2 RNA,(COVID-19) QUALITATIVE NAAT: SARS CoV2 RNA: NOT DETECTED

## 2020-05-17 NOTE — Telephone Encounter (Signed)
error 

## 2020-05-17 NOTE — Telephone Encounter (Signed)
Left message on pt's home phone and cell phone to call us back as soon as possible for lab (d-dimer) results.

## 2020-05-18 ENCOUNTER — Telehealth: Payer: Self-pay | Admitting: Emergency Medicine

## 2020-05-18 NOTE — Telephone Encounter (Signed)
Pt returned phone calls from Friday and Saturday about her elevated d-dimer. Advised to go to hospital for CT scan to rule out a blood clot. Pt does state she is feeling better today but will need a ride to the hospital. Stressed importance of the close follow up and additional imaging to rule out a deadly clot.

## 2020-05-19 ENCOUNTER — Telehealth: Payer: Self-pay | Admitting: Physician Assistant

## 2020-05-19 ENCOUNTER — Telehealth: Payer: Self-pay | Admitting: Emergency Medicine

## 2020-05-19 DIAGNOSIS — R0602 Shortness of breath: Secondary | ICD-10-CM

## 2020-05-19 DIAGNOSIS — R7989 Other specified abnormal findings of blood chemistry: Secondary | ICD-10-CM

## 2020-05-19 NOTE — Telephone Encounter (Signed)
Pt called requesting a non-contrast CT for the elevated D-dimer last week. Advised pt she would need a VQ scan to rule out a clot if allergic to IV contrast. Pt hesitant for VQ scan as well. Pt plans to contact her PCP for further guidance.  Pt states she feels better today than last week. Denies SOB or chest pain.

## 2020-05-19 NOTE — Telephone Encounter (Signed)
Per radiology, they are not comfortable doing this scan since patient is bck and forth about having and not having an allergy to contrast. Patient refuses to have a scan done anywhere else. Patient had D-Dimer and is aware of the risks of not having a scan done. Can you please advise on next steps and ideally contact patient to explain to her the risks and importance of getting a scan done.Stephanie Rollins

## 2020-05-19 NOTE — Telephone Encounter (Signed)
Confirmed with patient she does NOT have any knows allergy to contrast and she has had contrast before with no reaction.

## 2020-05-19 NOTE — Telephone Encounter (Signed)
CTA stat ordered.

## 2020-05-19 NOTE — Telephone Encounter (Signed)
Ordered doppler of both legs to rule out dvt due to elevated d-dimer.

## 2020-05-19 NOTE — Telephone Encounter (Signed)
Long discussion with patient. She does not want to go to hospital for V/Q scan. Pt has not had allergic reaction to contrast but she is worried about one and imaging downstairs will not do CTA. Will order ultrasound of legs due to elevated d-dimer and start there. Pt aware if SOB or chest pain to go to ED. Pt reminded to start doxycycline.

## 2020-05-19 NOTE — Telephone Encounter (Signed)
Patient called with concerns about testing. She has not had contrast in a couple years so she is not sure if she actually has an allergy to it but is very concerned. She had elevated D-Dimer and was advised to go to ER for CT scan to rule out blood clot.   Patient states she is willing to try CT with contrast, are you OK to order this?   Note to covering provider

## 2020-05-19 NOTE — Telephone Encounter (Signed)
Dr. Judie Petit has not listed any allergies to contrast however if you had an allergic like reaction after contrast I would be very cautious. I would do the VQ scan to eliminate any possibility of making your symptoms worse.

## 2020-05-19 NOTE — Addendum Note (Signed)
Addended byJomarie Longs on: 05/19/2020 02:12 PM   Modules accepted: Orders

## 2020-05-20 ENCOUNTER — Telehealth: Payer: Self-pay | Admitting: Neurology

## 2020-05-20 DIAGNOSIS — R7989 Other specified abnormal findings of blood chemistry: Secondary | ICD-10-CM

## 2020-05-20 DIAGNOSIS — M810 Age-related osteoporosis without current pathological fracture: Secondary | ICD-10-CM

## 2020-05-20 LAB — POCT CBC W AUTO DIFF (K'VILLE URGENT CARE)

## 2020-05-20 NOTE — Telephone Encounter (Signed)
Called patient, unable to reach her. Left msg advising her I changed the order for [facility].   FYI to ordering provider who was working on this

## 2020-05-20 NOTE — Telephone Encounter (Signed)
This does not have to be authorized. Was she referring to [facility]?

## 2020-05-20 NOTE — Telephone Encounter (Signed)
Ultrasound is out of order, patient must go to [facility] for imaging.  Called patient to advise her, no answer. Left msg for her to call back, advised this is urgent and needs to be done today.

## 2020-05-20 NOTE — Telephone Encounter (Signed)
[facility] called and stated they called patient to schedule US Venous for DVT and patient told them she wanted this is [facility]. Can we call and schedule this somewhere? Would it need a different authorization??

## 2020-05-20 NOTE — Telephone Encounter (Signed)
I'm not sure, [facility] called, I didn't speak to patient. All the scheduler said was patient was going to be in [place] at the breast center tomorrow and wanted this done in [place]?

## 2020-05-20 NOTE — Addendum Note (Signed)
Addended by: Jed Limerick on: 05/20/2020 10:21 AM   Modules accepted: Orders

## 2020-05-21 ENCOUNTER — Ambulatory Visit
Admission: RE | Admit: 2020-05-21 | Discharge: 2020-05-21 | Disposition: A | Discharge status: Home / Self Care | Payer: Medicare Other | Source: Ambulatory Visit | Attending: Family Medicine | Admitting: Family Medicine

## 2020-05-21 ENCOUNTER — Ambulatory Visit: Payer: Medicare Other

## 2020-05-21 ENCOUNTER — Ambulatory Visit (HOSPITAL_COMMUNITY)
Admission: RE | Admit: 2020-05-21 | Discharge: 2020-05-21 | Disposition: A | Discharge status: Home / Self Care | Payer: Medicare Other | Source: Ambulatory Visit | Attending: Physician Assistant | Admitting: Physician Assistant

## 2020-05-21 ENCOUNTER — Ambulatory Visit (HOSPITAL_COMMUNITY): Payer: Medicare Other

## 2020-05-21 ENCOUNTER — Other Ambulatory Visit: Payer: Self-pay

## 2020-05-21 DIAGNOSIS — R7989 Other specified abnormal findings of blood chemistry: Secondary | ICD-10-CM

## 2020-05-21 DIAGNOSIS — R928 Other abnormal and inconclusive findings on diagnostic imaging of breast: Secondary | ICD-10-CM

## 2020-05-21 NOTE — Progress Notes (Signed)
GREAT news. Negative for DVT.

## 2020-06-17 ENCOUNTER — Ambulatory Visit (INDEPENDENT_AMBULATORY_CARE_PROVIDER_SITE_OTHER): Payer: Medicare Other

## 2020-06-17 ENCOUNTER — Ambulatory Visit: Payer: Medicare Other | Admitting: Family Medicine

## 2020-06-17 ENCOUNTER — Ambulatory Visit (INDEPENDENT_AMBULATORY_CARE_PROVIDER_SITE_OTHER): Payer: Medicare Other | Admitting: Family Medicine

## 2020-06-17 ENCOUNTER — Encounter: Payer: Self-pay | Admitting: Family Medicine

## 2020-06-17 VITALS — BP 115/64 | HR 60 | Ht 62.99 in | Wt 129.0 lb

## 2020-06-17 DIAGNOSIS — R0781 Pleurodynia: Secondary | ICD-10-CM | POA: Diagnosis not present

## 2020-06-17 DIAGNOSIS — M25571 Pain in right ankle and joints of right foot: Secondary | ICD-10-CM

## 2020-06-17 DIAGNOSIS — M4854XS Collapsed vertebra, not elsewhere classified, thoracic region, sequela of fracture: Secondary | ICD-10-CM

## 2020-06-17 DIAGNOSIS — S299XXA Unspecified injury of thorax, initial encounter: Secondary | ICD-10-CM | POA: Diagnosis not present

## 2020-06-17 NOTE — Assessment & Plan Note (Signed)
Rib tenderness on L side with history of osteoporosis.  CXR with rib detail ordered.

## 2020-06-17 NOTE — Assessment & Plan Note (Signed)
Exam consistent with tendonitis of the R ankle.  Recommend icing and rest.  Referral to PT for her back and ankle.

## 2020-06-17 NOTE — Progress Notes (Signed)
Stephanie Rollins - 69 y.o. female MRN [id number]  Date of birth: 1951-08-13  Subjective Chief Complaint  Patient presents with  . Chest Pain  . Ankle Injury    HPI Stephanie Rollins is 69 y.o. female with history of osteoporosis here today with complaint of L sided rib pain.  She reports that she was picking up something and felt popping sensation and pain in her L ribs that radiates around under her breast and up into her shoulder.  This occurred about 2 days ago.  She denies shortness of breath or difficulty breathing.    She also has pain in her R ankle.  Reports that she had walked up some stairs a few days ago.  She had not done this in a while and noticed some pain across the top of the ankle the following day.  This has improved some.  ROS:  A comprehensive ROS was completed and negative except as noted per HPI  Allergies  Allergen Reactions  . Amoxicillin Rash and Other (See Comments)  . Ampicillin   . Azithromycin     REACTION: SOB  . Cephalexin   . Clarithromycin   . Codeine   . Flecainide   . Penicillins     REACTION: rash  . Shellfish Allergy     Sick to stomach   . Shellfish-Derived Products Hives and Nausea And Vomiting    Past Medical History:  Diagnosis Date  . Anal fistula   . Atrial fibrillation (HCC)   . Dyslipidemia   . Hearing loss    75%  . Hypertension   . IBS (irritable bowel syndrome)    remote history  . Interstitial cystitis   . Kidney stone   . Menopausal state   . MVP (mitral valve prolapse)   . Paroxysmal A-fib (HCC)    status post ablation  . Swallowing difficulty    problems for second/need dentures    Past Surgical History:  Procedure Laterality Date  . CARDIAC ELECTROPHYSIOLOGY MAPPING AND ABLATION     for A fib  . CESAREAN SECTION  1971, 1973   x 2  . NASAL SEPTUM SURGERY  1977  . pulmonary vein isolation  06-2006   procedure for AFIB /[facility]    Social History   Socioeconomic History  . Marital status:  Divorced    Spouse name: Not on file  . Number of children: 2  . Years of education: Not on file  . Highest education level: Not on file  Occupational History  . Occupation: retired  Tobacco Use  . Smoking status: Never Smoker  . Smokeless tobacco: Never Used  Vaping Use  . Vaping Use: Never used  Substance and Sexual Activity  . Alcohol use: No  . Drug use: No  . Sexual activity: Not on file    Comment: unemployed, GED, divorced,2 adult children, no caffeine, no regular exercise  Other Topics Concern  . Not on file  Social History Narrative  . Not on file   Social Determinants of Health   Financial Resource Strain:   . Difficulty of Paying Living Expenses:   Food Insecurity:   . Worried About Programme researcher, broadcasting/film/video in the Last Year:   . Barista in the Last Year:   Transportation Needs:   . Freight forwarder (Medical):   Marland Kitchen Lack of Transportation (Non-Medical):   Physical Activity:   . Days of Exercise per Week:   . Minutes of Exercise per Session:  Stress:   . Feeling of Stress :   Social Connections:   . Frequency of Communication with Friends and Family:   . Frequency of Social Gatherings with Friends and Family:   . Attends Religious Services:   . Active Member of Clubs or Organizations:   . Attends Banker Meetings:   Marland Kitchen Marital Status:     Family History  Problem Relation Age of Onset  . Hypertension Mother   . Diabetes Maternal Grandmother   . Heart disease Brother   . Mastocytosis Brother   . Stroke Paternal Aunt   . Heart attack Father   . Heart disease Sister        x 2  . Colon cancer Neg Hx   . Esophageal cancer Neg Hx     Health Maintenance  Topic Date Due  . COVID-19 Vaccine (1) Never done  . PNA vac Low Risk Adult (1 of 2 - PCV13) 10/28/2020 (Originally 06/08/2014)  . COLONOSCOPY  11/21/2020 (Originally 03/17/2016)  . INFLUENZA VACCINE  06/22/2020  . TETANUS/TDAP  03/16/2021  . MAMMOGRAM  05/21/2022  . DEXA SCAN   Completed  . Hepatitis C Screening  Completed     ----------------------------------------------------------------------------------------------------------------------------------------------------------------------------------------------------------------- Physical Exam BP (!) 115/64 (BP Location: Left Arm, Patient Position: Sitting, Cuff Size: Normal)   Pulse 60   Ht 5' 2.99" (1.6 m)   Wt 129 lb (58.5 kg)   SpO2 98%   BMI 22.86 kg/m   Physical Exam Constitutional:      Appearance: Normal appearance.  HENT:     Head: Normocephalic and atraumatic.  Eyes:     General: No scleral icterus. Cardiovascular:     Rate and Rhythm: Normal rate and regular rhythm.  Pulmonary:     Effort: Pulmonary effort is normal.     Breath sounds: Normal breath sounds.  Chest:     Chest wall: Tenderness (TTP along ribs on the L side) present.  Musculoskeletal:     Cervical back: Neck supple.     Comments: R ankle with normal ROM.  Mild ttp along tibialis anterior tendon.  No swelling noted.    Neurological:     Mental Status: She is alert.  Psychiatric:        Mood and Affect: Mood normal.     ------------------------------------------------------------------------------------------------------------------------------------------------------------------------------------------------------------------- Assessment and Plan  Rib pain on left side Rib tenderness on L side with history of osteoporosis.  CXR with rib detail ordered.    Acute right ankle pain Exam consistent with tendonitis of the R ankle.  Recommend icing and rest.  Referral to PT for her back and ankle.     No orders of the defined types were placed in this encounter.   No follow-ups on file.    This visit occurred during the SARS-CoV-2 public health emergency.  Safety protocols were in place, including screening questions prior to the visit, additional usage of staff PPE, and extensive cleaning of exam room while  observing appropriate contact time as indicated for disinfecting solutions.

## 2020-06-17 NOTE — Patient Instructions (Signed)
Please have xray completed-We'll be in touch with results I have entered a referral to Physical therapy.

## 2020-07-02 DIAGNOSIS — M545 Low back pain: Secondary | ICD-10-CM | POA: Diagnosis not present

## 2020-07-09 DIAGNOSIS — M545 Low back pain: Secondary | ICD-10-CM | POA: Diagnosis not present

## 2020-07-16 DIAGNOSIS — M545 Low back pain: Secondary | ICD-10-CM | POA: Diagnosis not present

## 2020-07-30 DIAGNOSIS — M545 Low back pain: Secondary | ICD-10-CM | POA: Diagnosis not present

## 2020-08-06 DIAGNOSIS — M545 Low back pain: Secondary | ICD-10-CM | POA: Diagnosis not present

## 2020-08-20 DIAGNOSIS — M545 Low back pain: Secondary | ICD-10-CM | POA: Diagnosis not present

## 2020-08-27 NOTE — Progress Notes (Signed)
Subjective:    CC: poss UTI, flu shot, check ears  HPI: Pleasant 69 year old female presenting for evaluation for a possible UTI and to get her flu shot.  She would also like me to check her ears today to make sure she does not have an ear infection.  Possible UTI-notes that she has recently had mild burning with urination couple of times.  States the burning is not bad and it is intermittent.  She has not had any frequency or new urgency.  She is drinking 3 to 420 ounce bottles of water daily to stay hydrated.  Ear concerns-notes that her allergies have been really bad lately.  She does wear ear buds bilaterally connected to an amplifier instead of wearing hearing aids.  Notes that her left ear sometimes gets red and hot on the outside.  Her right ear seems to have a pulsing sound in it at times.  She also notes that she has noticed that the inside of her ears seems sticky when she is cleaning them.  She is not sure if that is discharge or not.  She has been having pressure in her head and gets a frontal headache with bending over.  Says been going on for about 3 weeks.  Occasionally she has a sharp right ear pain but it is short-lived and intermittent.  She has been waking up sweaty in her axillary areas and thought this may be due to an ear infection.  She has not been running a fever and has had no chills.  Using saline nasal spray as needed but this has not been helpful.  She avoids taking certain medications because she is worried about her heart.  Not currently on a daily antihistamine and does not use Flonase nasal spray.  I reviewed the past medical history, family history, social history, surgical history, and allergies today and no changes were needed.  Please see the problem list section below in epic for further details.  Past Medical History: Past Medical History:  Diagnosis Date  . Anal fistula   . Atrial fibrillation (HCC)   . Dyslipidemia   . Hearing loss    75%  .  Hypertension   . IBS (irritable bowel syndrome)    remote history  . Interstitial cystitis   . Kidney stone   . Menopausal state   . MVP (mitral valve prolapse)   . Paroxysmal A-fib (HCC)    status post ablation  . Swallowing difficulty    problems for second/need dentures   Past Surgical History: Past Surgical History:  Procedure Laterality Date  . CARDIAC ELECTROPHYSIOLOGY MAPPING AND ABLATION     for A fib  . CESAREAN SECTION  1971, 1973   x 2  . NASAL SEPTUM SURGERY  1977  . pulmonary vein isolation  06-2006   procedure for AFIB /[facility]   Social History: Social History   Socioeconomic History  . Marital status: Divorced    Spouse name: Not on file  . Number of children: 2  . Years of education: Not on file  . Highest education level: Not on file  Occupational History  . Occupation: retired  Tobacco Use  . Smoking status: Never Smoker  . Smokeless tobacco: Never Used  Vaping Use  . Vaping Use: Never used  Substance and Sexual Activity  . Alcohol use: No  . Drug use: No  . Sexual activity: Not on file    Comment: unemployed, GED, divorced,2 adult children, no caffeine, no regular exercise  Other Topics Concern  . Not on file  Social History Narrative  . Not on file   Social Determinants of Health   Financial Resource Strain:   . Difficulty of Paying Living Expenses: Not on file  Food Insecurity:   . Worried About Programme researcher, broadcasting/film/video in the Last Year: Not on file  . Ran Out of Food in the Last Year: Not on file  Transportation Needs:   . Lack of Transportation (Medical): Not on file  . Lack of Transportation (Non-Medical): Not on file  Physical Activity:   . Days of Exercise per Week: Not on file  . Minutes of Exercise per Session: Not on file  Stress:   . Feeling of Stress : Not on file  Social Connections:   . Frequency of Communication with Friends and Family: Not on file  . Frequency of Social Gatherings with Friends and Family: Not on  file  . Attends Religious Services: Not on file  . Active Member of Clubs or Organizations: Not on file  . Attends Banker Meetings: Not on file  . Marital Status: Not on file   Family History: Family History  Problem Relation Age of Onset  . Hypertension Mother   . Diabetes Maternal Grandmother   . Heart disease Brother   . Mastocytosis Brother   . Stroke Paternal Aunt   . Heart attack Father   . Heart disease Sister        x 2  . Colon cancer Neg Hx   . Esophageal cancer Neg Hx    Allergies: Allergies  Allergen Reactions  . Amoxicillin Rash and Other (See Comments)  . Ampicillin   . Azithromycin     REACTION: SOB  . Cephalexin   . Clarithromycin   . Codeine   . Flecainide   . Penicillins     REACTION: rash  . Shellfish Allergy     Sick to stomach   . Shellfish-Derived Products Hives and Nausea And Vomiting   Medications: See med rec.  Review of Systems: See HPI for pertinent positives and negatives.   Objective:    General: Well Developed, well nourished, and in no acute distress.  Neuro: Alert and oriented x3.  HEENT: Normocephalic, atraumatic.  Bilateral TMs shiny with intact cone of light.  Moderate clear fluid noted behind TMs bilaterally but no redness or purulence.  No frontal or maxillary tenderness to palpation.  No cervical lymphadenopathy. Skin: Warm and dry. Cardiac: Regular rate and rhythm, no murmurs rubs or gallops, no lower extremity edema.  Respiratory: Clear to auscultation bilaterally. Not using accessory muscles, speaking in full sentences.   Impression and Recommendations:    1. Dysuria POCT UA negative for nitrites and leukocytes, positive for moderate blood.  Sending for culture.  Advised patient to continue drinking at least 3-4 bottles of water a day and monitor for worsening or new symptoms.  We will wait for culture to come back to determine the need for treatment. - POCT URINALYSIS DIP (CLINITEK) - Urine Culture  2.  Pressure sensation in both ears No indication of ear infections bilaterally but she does appear to have middle ear effusion.  This is most likely related to eustachian tube dysfunction and her untreated allergic rhinitis.  Suggested starting an oral second-generation antihistamine such as Claritin daily.  Also would like her to start using Flonase daily.  I do not believe her mild axillary sweating at night is related to any kind of ear infection as  she has had no fevers/chills.  3. Need for immunization against influenza Flu vaccine given in office. - Flu Vaccine QUAD High Dose(Fluad)  Return if symptoms worsen or fail to improve. ___________________________________________ Thayer Ohm, DNP, APRN, FNP-BC Primary Care and Sports Medicine [facility] [place]

## 2020-08-28 ENCOUNTER — Ambulatory Visit (INDEPENDENT_AMBULATORY_CARE_PROVIDER_SITE_OTHER): Payer: Medicare Other | Admitting: Medical-Surgical

## 2020-08-28 ENCOUNTER — Encounter: Payer: Self-pay | Admitting: Medical-Surgical

## 2020-08-28 VITALS — BP 147/77 | HR 69 | Temp 98.0°F | Ht 63.0 in | Wt 131.5 lb

## 2020-08-28 DIAGNOSIS — H938X3 Other specified disorders of ear, bilateral: Secondary | ICD-10-CM | POA: Diagnosis not present

## 2020-08-28 DIAGNOSIS — R300 Dysuria: Secondary | ICD-10-CM | POA: Diagnosis not present

## 2020-08-28 DIAGNOSIS — Z23 Encounter for immunization: Secondary | ICD-10-CM | POA: Diagnosis not present

## 2020-08-28 LAB — POCT URINALYSIS DIP (CLINITEK)
Bilirubin, UA: NEGATIVE
Glucose, UA: NEGATIVE mg/dL
Ketones, POC UA: NEGATIVE mg/dL
Leukocytes, UA: NEGATIVE
Nitrite, UA: NEGATIVE
POC PROTEIN,UA: NEGATIVE
Spec Grav, UA: 1.02 (ref 1.010–1.025)
Urobilinogen, UA: 0.2 E.U./dL
pH, UA: 5.5 (ref 5.0–8.0)

## 2020-08-28 MED ORDER — LORATADINE 10 MG PO TABS
10.0000 mg | ORAL_TABLET | Freq: Every day | ORAL | 0 refills | Status: DC
Start: 2020-08-28 — End: 2021-11-18

## 2020-08-28 MED ORDER — FLUTICASONE PROPIONATE 50 MCG/ACT NA SUSP
2.0000 | Freq: Every day | NASAL | 3 refills | Status: DC
Start: 2020-08-28 — End: 2021-01-21

## 2020-08-28 NOTE — Patient Instructions (Signed)

## 2020-08-29 LAB — URINE CULTURE
MICRO NUMBER:: 11045145
Result:: NO GROWTH
SPECIMEN QUALITY:: ADEQUATE

## 2020-09-03 DIAGNOSIS — M545 Low back pain, unspecified: Secondary | ICD-10-CM | POA: Diagnosis not present

## 2020-09-10 DIAGNOSIS — M545 Low back pain, unspecified: Secondary | ICD-10-CM | POA: Diagnosis not present

## 2020-09-15 ENCOUNTER — Telehealth: Payer: Self-pay

## 2020-09-15 MED ORDER — AMBULATORY NON FORMULARY MEDICATION: 99 refills | Status: AC

## 2020-09-15 NOTE — Telephone Encounter (Signed)
Stephanie Rollins lvm requesting a quad cane be ordered for her. PT told her she can transition from the walker with the cane.

## 2020-09-15 NOTE — Telephone Encounter (Signed)
Prescription printed, can leave up front for patient to pick up and take to medical supply store or we can fax it wherever she wants [place] to send it.

## 2020-09-16 NOTE — Telephone Encounter (Signed)
LVM for patient callback to either pick-up Rx for 4-prong cane or provide fax # to DME Supply

## 2020-09-17 DIAGNOSIS — M545 Low back pain, unspecified: Secondary | ICD-10-CM | POA: Diagnosis not present

## 2020-09-24 DIAGNOSIS — M545 Low back pain, unspecified: Secondary | ICD-10-CM | POA: Diagnosis not present

## 2020-10-01 DIAGNOSIS — M545 Low back pain, unspecified: Secondary | ICD-10-CM | POA: Diagnosis not present

## 2020-10-08 DIAGNOSIS — M545 Low back pain, unspecified: Secondary | ICD-10-CM | POA: Diagnosis not present

## 2020-10-13 ENCOUNTER — Other Ambulatory Visit: Payer: Self-pay

## 2020-10-13 ENCOUNTER — Ambulatory Visit (INDEPENDENT_AMBULATORY_CARE_PROVIDER_SITE_OTHER): Payer: Medicare Other

## 2020-10-13 ENCOUNTER — Ambulatory Visit (INDEPENDENT_AMBULATORY_CARE_PROVIDER_SITE_OTHER): Payer: Medicare Other | Admitting: Sports Medicine

## 2020-10-13 DIAGNOSIS — M47816 Spondylosis without myelopathy or radiculopathy, lumbar region: Secondary | ICD-10-CM

## 2020-10-13 DIAGNOSIS — S22080K Wedge compression fracture of T11-T12 vertebra, subsequent encounter for fracture with nonunion: Secondary | ICD-10-CM | POA: Diagnosis not present

## 2020-10-13 DIAGNOSIS — S22000A Wedge compression fracture of unspecified thoracic vertebra, initial encounter for closed fracture: Secondary | ICD-10-CM | POA: Diagnosis not present

## 2020-10-13 DIAGNOSIS — S32030A Wedge compression fracture of third lumbar vertebra, initial encounter for closed fracture: Secondary | ICD-10-CM | POA: Diagnosis not present

## 2020-10-13 DIAGNOSIS — M65321 Trigger finger, right index finger: Secondary | ICD-10-CM

## 2020-10-13 DIAGNOSIS — M65322 Trigger finger, left index finger: Secondary | ICD-10-CM

## 2020-10-13 DIAGNOSIS — I517 Cardiomegaly: Secondary | ICD-10-CM | POA: Diagnosis not present

## 2020-10-13 DIAGNOSIS — S32050K Wedge compression fracture of fifth lumbar vertebra, subsequent encounter for fracture with nonunion: Secondary | ICD-10-CM | POA: Diagnosis not present

## 2020-10-13 DIAGNOSIS — M545 Low back pain, unspecified: Secondary | ICD-10-CM | POA: Diagnosis not present

## 2020-10-13 DIAGNOSIS — S32040A Wedge compression fracture of fourth lumbar vertebra, initial encounter for closed fracture: Secondary | ICD-10-CM

## 2020-10-13 DIAGNOSIS — S32020A Wedge compression fracture of second lumbar vertebra, initial encounter for closed fracture: Secondary | ICD-10-CM

## 2020-10-13 DIAGNOSIS — M419 Scoliosis, unspecified: Secondary | ICD-10-CM | POA: Diagnosis not present

## 2020-10-13 DIAGNOSIS — S32010S Wedge compression fracture of first lumbar vertebra, sequela: Secondary | ICD-10-CM

## 2020-10-13 DIAGNOSIS — M810 Age-related osteoporosis without current pathological fracture: Secondary | ICD-10-CM | POA: Diagnosis not present

## 2020-10-13 NOTE — Assessment & Plan Note (Signed)
France has noted increasing pain and triggering of both index finger since she started PT several weeks ago. She does not desire medical treatment or interventional treatment so we will start conservatively with trigger finger conditioning exercises, return to see me in a month, we can certainly try bilateral trigger finger injections if no better.

## 2020-10-13 NOTE — Assessment & Plan Note (Signed)
Severe osteoporosis with multiple vertebral compression fractures, she is worried that Fosamax or Prolia will give her atrial fibrillation, I explained to her that the likelihood was almost nil, and that the benefits of treating her severe osteoporosis in the light of multiple compression fractures outweighed any risks of using osteoporosis medication, she is going to think about this.

## 2020-10-13 NOTE — Assessment & Plan Note (Signed)
Multiple compression fractures, severe lumbar spinal stenosis. Continue physical therapy for the back, adding some x-rays to ensure no new vertebral compression fractures, she does not desire any medications, she does not desire injections, she can certainly come back to talk to me if she would like to try medication or an injection should physical therapy not be sufficient. I did advise her to go ahead and discontinue her LSO brace.

## 2020-10-13 NOTE — Progress Notes (Signed)
    Procedures performed today:    None.  Independent interpretation of notes and tests performed by another provider:   None.  Brief History, Exam, Impression, and Recommendations:    Acquired trigger finger of both index fingers Stephanie Rollins has noted increasing pain and triggering of both index finger since she started PT several weeks ago. She does not desire medical treatment or interventional treatment so we will start conservatively with trigger finger conditioning exercises, return to see me in a month, we can certainly try bilateral trigger finger injections if no better.  Osteoporosis Severe osteoporosis with multiple vertebral compression fractures, she is worried that Fosamax or Prolia will give her atrial fibrillation, I explained to her that the likelihood was almost nil, and that the benefits of treating her severe osteoporosis in the light of multiple compression fractures outweighed any risks of using osteoporosis medication, she is going to think about this.  Spondylosis of lumbar spine Multiple compression fractures, severe lumbar spinal stenosis. Continue physical therapy for the back, adding some x-rays to ensure no new vertebral compression fractures, she does not desire any medications, she does not desire injections, she can certainly come back to talk to me if she would like to try medication or an injection should physical therapy not be sufficient. I did advise her to go ahead and discontinue her LSO brace.    ___________________________________________ Ihor Austin. Benjamin Stain, M.D., ABFM., CAQSM. Primary Care and Sports Medicine [facility]  Adjunct Instructor of Family Medicine  [employer]

## 2020-10-15 DIAGNOSIS — M545 Low back pain, unspecified: Secondary | ICD-10-CM | POA: Diagnosis not present

## 2020-10-22 DIAGNOSIS — M545 Low back pain, unspecified: Secondary | ICD-10-CM | POA: Diagnosis not present

## 2021-01-21 ENCOUNTER — Other Ambulatory Visit: Payer: Self-pay | Admitting: Medical-Surgical

## 2021-01-21 DIAGNOSIS — H903 Sensorineural hearing loss, bilateral: Secondary | ICD-10-CM | POA: Diagnosis not present

## 2021-01-27 DIAGNOSIS — Z23 Encounter for immunization: Secondary | ICD-10-CM | POA: Diagnosis not present

## 2021-01-27 DIAGNOSIS — S60221A Contusion of right hand, initial encounter: Secondary | ICD-10-CM | POA: Diagnosis not present

## 2021-01-27 DIAGNOSIS — S8992XA Unspecified injury of left lower leg, initial encounter: Secondary | ICD-10-CM | POA: Diagnosis not present

## 2021-01-27 DIAGNOSIS — S0083XA Contusion of other part of head, initial encounter: Secondary | ICD-10-CM | POA: Diagnosis not present

## 2021-01-27 DIAGNOSIS — S199XXA Unspecified injury of neck, initial encounter: Secondary | ICD-10-CM | POA: Diagnosis not present

## 2021-01-27 DIAGNOSIS — R519 Headache, unspecified: Secondary | ICD-10-CM | POA: Diagnosis not present

## 2021-01-27 DIAGNOSIS — G8911 Acute pain due to trauma: Secondary | ICD-10-CM | POA: Diagnosis not present

## 2021-01-27 DIAGNOSIS — S8002XA Contusion of left knee, initial encounter: Secondary | ICD-10-CM | POA: Diagnosis not present

## 2021-01-27 DIAGNOSIS — S8991XA Unspecified injury of right lower leg, initial encounter: Secondary | ICD-10-CM | POA: Diagnosis not present

## 2021-01-27 DIAGNOSIS — M25572 Pain in left ankle and joints of left foot: Secondary | ICD-10-CM | POA: Diagnosis not present

## 2021-01-27 DIAGNOSIS — S01111A Laceration without foreign body of right eyelid and periocular area, initial encounter: Secondary | ICD-10-CM | POA: Diagnosis not present

## 2021-01-27 DIAGNOSIS — I4891 Unspecified atrial fibrillation: Secondary | ICD-10-CM | POA: Diagnosis not present

## 2021-01-27 DIAGNOSIS — S60011A Contusion of right thumb without damage to nail, initial encounter: Secondary | ICD-10-CM | POA: Diagnosis not present

## 2021-01-27 DIAGNOSIS — S80211A Abrasion, right knee, initial encounter: Secondary | ICD-10-CM | POA: Diagnosis not present

## 2021-01-27 DIAGNOSIS — S0993XA Unspecified injury of face, initial encounter: Secondary | ICD-10-CM | POA: Diagnosis not present

## 2021-01-27 DIAGNOSIS — S6991XA Unspecified injury of right wrist, hand and finger(s), initial encounter: Secondary | ICD-10-CM | POA: Diagnosis not present

## 2021-01-27 DIAGNOSIS — S8001XA Contusion of right knee, initial encounter: Secondary | ICD-10-CM | POA: Diagnosis not present

## 2021-01-27 DIAGNOSIS — S80212A Abrasion, left knee, initial encounter: Secondary | ICD-10-CM | POA: Diagnosis not present

## 2021-02-02 ENCOUNTER — Ambulatory Visit (INDEPENDENT_AMBULATORY_CARE_PROVIDER_SITE_OTHER): Payer: Medicare HMO | Admitting: Family Medicine

## 2021-02-02 ENCOUNTER — Other Ambulatory Visit: Payer: Self-pay

## 2021-02-02 ENCOUNTER — Encounter: Payer: Self-pay | Admitting: Family Medicine

## 2021-02-02 VITALS — BP 117/69 | HR 71 | Temp 98.5°F

## 2021-02-02 DIAGNOSIS — Z4802 Encounter for removal of sutures: Secondary | ICD-10-CM

## 2021-02-02 DIAGNOSIS — W19XXXA Unspecified fall, initial encounter: Secondary | ICD-10-CM

## 2021-02-02 NOTE — Progress Notes (Signed)
Acute Office Visit  Subjective:    Patient ID: CATHELINE HIXON, female    DOB: 04/11/1951, 70 y.o.   MRN: [id number]  Chief Complaint  Patient presents with  . Laceration  . Fall    HPI Patient is in today for follow-up on fall with laceration - needing sutures removed.  She was seen in the [facility] ED on 01/27/21 after she fell in her home (related to poor shoes) Laceration above right eyebrow required 4 sutures. Bilateral knees and forearms with abrasions. All imaging was negative for any acute findings (CT cervical spine, head CT, R hand x-ray, R knee x-ray, L knee x-ray, L ankle x-ray). She was instructed to visit PCP 5-6 days post-accident for suture removal.  Today she reports feeling much better overall. All bruises and abrasions are healing nicely. She has been keeping her sutures open to air most of the time and applying antibiotic ointment and a Band-Aid at night. She denies any acute pain, only soreness that continues to improve daily. She reports that she does not live alone, has gotten rid of the shoes that caused her fall, and is very cautious. Her granddaughter has been helping her with wounds care.  No acute concerns today. Denies signs of infection, fever, chest pain, shortness of breath, rashes.      Past Medical History:  Diagnosis Date  . Anal fistula   . Atrial fibrillation (HCC)   . Dyslipidemia   . Hearing loss    75%  . Hypertension   . IBS (irritable bowel syndrome)    remote history  . Interstitial cystitis   . Kidney stone   . Menopausal state   . MVP (mitral valve prolapse)   . Paroxysmal A-fib (HCC)    status post ablation  . Swallowing difficulty    problems for second/need dentures    Past Surgical History:  Procedure Laterality Date  . CARDIAC ELECTROPHYSIOLOGY MAPPING AND ABLATION     for A fib  . CESAREAN SECTION  1971, 1973   x 2  . NASAL SEPTUM SURGERY  1977  . pulmonary vein isolation  06-2006   procedure for AFIB /[facility]    Family History  Problem Relation Age of Onset  . Hypertension Mother   . Diabetes Maternal Grandmother   . Heart disease Brother   . Mastocytosis Brother   . Stroke Paternal Aunt   . Heart attack Father   . Heart disease Sister        x 2  . Colon cancer Neg Hx   . Esophageal cancer Neg Hx     Social History   Socioeconomic History  . Marital status: Divorced    Spouse name: Not on file  . Number of children: 2  . Years of education: Not on file  . Highest education level: Not on file  Occupational History  . Occupation: retired  Tobacco Use  . Smoking status: Never Smoker  . Smokeless tobacco: Never Used  Vaping Use  . Vaping Use: Never used  Substance and Sexual Activity  . Alcohol use: No  . Drug use: No  . Sexual activity: Not on file    Comment: unemployed, GED, divorced,2 adult children, no caffeine, no regular exercise  Other Topics Concern  . Not on file  Social History Narrative  . Not on file   Social Determinants of Health   Financial Resource Strain: Not on file  Food Insecurity: Not on file  Transportation Needs: Not on  file  Physical Activity: Not on file  Stress: Not on file  Social Connections: Not on file  Intimate Partner Violence: Not on file    Outpatient Medications Prior to Visit  Medication Sig Dispense Refill  . AMBULATORY NON FORMULARY MEDICATION 4-prong cane dx ambulatory dysfunction 1 Units prn  . aspirin 81 MG chewable tablet Chew 81 mg by mouth daily.     Marland Kitchen CALCIUM-VITAMIN D PO Take by mouth.    . fluticasone (FLONASE) 50 MCG/ACT nasal spray SHAKE LIQUID AND USE 2 SPRAYS IN EACH NOSTRIL DAILY  **PATIENT NEEDS OFFICE VISIT FOR ADDITIONAL REFILLS** 16 g 0  . loratadine (CLARITIN) 10 MG tablet Take 1 tablet (10 mg total) by mouth daily. 30 tablet 0  . metoprolol tartrate (LOPRESSOR) 25 MG tablet TAKE 1 TABLET BY MOUTH TWICE DAILY. 180 tablet 3   No facility-administered medications prior to visit.    Allergies   Allergen Reactions  . Amoxicillin Rash and Other (See Comments)  . Ampicillin   . Azithromycin     REACTION: SOB  . Cephalexin   . Clarithromycin   . Codeine   . Flecainide   . Penicillins     REACTION: rash  . Shellfish Allergy     Sick to stomach   . Shellfish-Derived Products Hives and Nausea And Vomiting    Review of Systems All review of systems negative except what is listed in the HPI     Objective:    Physical Exam Vitals reviewed.  Constitutional:      Appearance: Normal appearance.  HENT:     Head: Normocephalic.     Comments: Healing bruise to right forehead and orbital area. Laceration with 4 sutures, approximated edges, no drainage, no erythema, no edema Cardiovascular:     Rate and Rhythm: Normal rate and regular rhythm.     Pulses: Normal pulses.     Heart sounds: Normal heart sounds.  Pulmonary:     Effort: Pulmonary effort is normal.     Breath sounds: Normal breath sounds.  Musculoskeletal:        General: No swelling. Normal range of motion.  Skin:    Comments: Healing, yellow bruises to right forehead, bilateral knees, right wrist  Neurological:     General: No focal deficit present.     Mental Status: She is alert and oriented to person, place, and time. Mental status is at baseline.  Psychiatric:        Mood and Affect: Mood normal.        Behavior: Behavior normal.        Thought Content: Thought content normal.        Judgment: Judgment normal.     There were no vitals taken for this visit. Wt Readings from Last 3 Encounters:  08/28/20 131 lb 8 oz (59.6 kg)  06/17/20 129 lb (58.5 kg)  05/15/20 126 lb (57.2 kg)    Health Maintenance Due  Topic Date Due  . COVID-19 Vaccine (1) Never done  . PNA vac Low Risk Adult (1 of 2 - PCV13) Never done  . COLONOSCOPY (Pts 45-38yrs Insurance coverage will need to be confirmed)  03/17/2016    There are no preventive care reminders to display for this patient.   Lab Results  Component  Value Date   TSH 1.52 05/28/2019   Lab Results  Component Value Date   WBC 7.9 10/29/2019   HGB 13.5 10/29/2019   HCT 41.1 10/29/2019   MCV 89.0 10/29/2019  PLT 133 (L) 10/29/2019   Lab Results  Component Value Date   NA 141 10/29/2019   K 3.9 10/29/2019   CO2 27 10/29/2019   GLUCOSE 99 10/29/2019   BUN 11 10/29/2019   CREATININE 0.62 10/29/2019   BILITOT 0.9 10/29/2019   ALKPHOS 83 10/30/2018   AST 17 10/29/2019   ALT 12 10/29/2019   PROT 7.6 10/29/2019   ALBUMIN 4.8 10/30/2018   CALCIUM 10.0 10/29/2019   ANIONGAP 9 10/30/2018   Lab Results  Component Value Date   CHOL 240 (H) 10/29/2019   Lab Results  Component Value Date   HDL 79 10/29/2019   Lab Results  Component Value Date   LDLCALC 140 (H) 10/29/2019   Lab Results  Component Value Date   TRIG 97 10/29/2019   Lab Results  Component Value Date   CHOLHDL 3.0 10/29/2019   Lab Results  Component Value Date   HGBA1C 5.4 02/16/2019       Assessment & Plan:   1. Fall, initial encounter 2. Encounter for removal of sutures  Patient looks great today. Healing nicely from fall. No acute distress or concerns today. Right forehead laceration sutures - area cleaned prior to suture removal. Steri-strips and Band-Aid applied at patient's request. Steri-strips can fall off on their own or be removed in the next 3-4 days. Patient educated on signs and symptoms requiring further evaluation.     Follow-up as needed.   Clayborne Dana, NP

## 2021-02-04 ENCOUNTER — Other Ambulatory Visit: Payer: Self-pay

## 2021-02-04 ENCOUNTER — Encounter: Payer: Self-pay | Admitting: Cardiology

## 2021-02-04 ENCOUNTER — Ambulatory Visit (INDEPENDENT_AMBULATORY_CARE_PROVIDER_SITE_OTHER): Payer: Medicare HMO | Admitting: Cardiology

## 2021-02-04 VITALS — BP 136/83 | HR 73 | Ht 63.0 in | Wt 134.0 lb

## 2021-02-04 DIAGNOSIS — I341 Nonrheumatic mitral (valve) prolapse: Secondary | ICD-10-CM

## 2021-02-04 DIAGNOSIS — I480 Paroxysmal atrial fibrillation: Secondary | ICD-10-CM

## 2021-02-04 DIAGNOSIS — R002 Palpitations: Secondary | ICD-10-CM

## 2021-02-04 NOTE — Progress Notes (Signed)
HPI: FU SVT as well as atrial fibrillation and mitral valve prolapse. Cardiac catheterization in September of 2002 showed normal coronary arteries and normal LV function with an ejection fraction of 65%. Myoview in 2007 showed EF 70 and normal perfusion. She has had a previous ablation of her atrial fibrillation in August 2007 at [facility]. She has also had recurrent palpitations secondary to PVCs treated with beta blockade. Event monitor 12/14 showed sinus with brief PAT. Monitor January 2020 showed sinus rhythm with PACs, PVCs, brief PAT and 4/7 beat runs of nonsustained ventricular tachycardia.  Echocardiogram March 2020 showed normal LV function.  Since I last saw her  she has occasional palpitations unchanged.  These are short-lived and not sustained.  She has not had syncope.  She has some dyspnea on exertion but no orthopnea, PND, pedal edema or chest pain.  Current Outpatient Medications  Medication Sig Dispense Refill  . AMBULATORY NON FORMULARY MEDICATION 4-prong cane dx ambulatory dysfunction 1 Units prn  . aspirin 81 MG chewable tablet Chew 81 mg by mouth daily.     Marland Kitchen CALCIUM-VITAMIN D PO Take by mouth.    . fluticasone (FLONASE) 50 MCG/ACT nasal spray SHAKE LIQUID AND USE 2 SPRAYS IN EACH NOSTRIL DAILY  **PATIENT NEEDS OFFICE VISIT FOR ADDITIONAL REFILLS** 16 g 0  . loratadine (CLARITIN) 10 MG tablet Take 1 tablet (10 mg total) by mouth daily. 30 tablet 0  . metoprolol tartrate (LOPRESSOR) 25 MG tablet TAKE 1 TABLET BY MOUTH TWICE DAILY. 180 tablet 3   No current facility-administered medications for this visit.     Past Medical History:  Diagnosis Date  . Anal fistula   . Atrial fibrillation (HCC)   . Dyslipidemia   . Hearing loss    75%  . Hypertension   . IBS (irritable bowel syndrome)    remote history  . Interstitial cystitis   . Kidney stone   . Menopausal state   . MVP (mitral valve prolapse)   . Paroxysmal A-fib (HCC)    status post ablation  .  Swallowing difficulty    problems for second/need dentures    Past Surgical History:  Procedure Laterality Date  . CARDIAC ELECTROPHYSIOLOGY MAPPING AND ABLATION     for A fib  . CESAREAN SECTION  1971, 1973   x 2  . NASAL SEPTUM SURGERY  1977  . pulmonary vein isolation  06-2006   procedure for AFIB /[facility]    Social History   Socioeconomic History  . Marital status: Divorced    Spouse name: Not on file  . Number of children: 2  . Years of education: Not on file  . Highest education level: Not on file  Occupational History  . Occupation: retired  Tobacco Use  . Smoking status: Never Smoker  . Smokeless tobacco: Never Used  Vaping Use  . Vaping Use: Never used  Substance and Sexual Activity  . Alcohol use: No  . Drug use: No  . Sexual activity: Not on file    Comment: unemployed, GED, divorced,2 adult children, no caffeine, no regular exercise  Other Topics Concern  . Not on file  Social History Narrative  . Not on file   Social Determinants of Health   Financial Resource Strain: Not on file  Food Insecurity: Not on file  Transportation Needs: Not on file  Physical Activity: Not on file  Stress: Not on file  Social Connections: Not on file  Intimate Partner Violence: Not  on file    Family History  Problem Relation Age of Onset  . Hypertension Mother   . Diabetes Maternal Grandmother   . Heart disease Brother   . Mastocytosis Brother   . Stroke Paternal Aunt   . Heart attack Father   . Heart disease Sister        x 2  . Colon cancer Neg Hx   . Esophageal cancer Neg Hx     ROS: no fevers or chills, productive cough, hemoptysis, dysphasia, odynophagia, melena, hematochezia, dysuria, hematuria, rash, seizure activity, orthopnea, PND, pedal edema, claudication. Remaining systems are negative.  Physical Exam: Well-developed well-nourished in no acute distress.  Skin is warm and dry.  HEENT is normal.  Neck is supple.  Chest is clear to  auscultation with normal expansion.  Cardiovascular exam is regular rate and rhythm.  Abdominal exam nontender or distended. No masses palpated. Extremities show no edema. neuro grossly intact  ECG-sinus rhythm with short PR interval, PACs, nonspecific ST changes.  Personally reviewed  A/P  1 PAF-s/p ablation. No recurrences; continue beta blocker.  2 palpitations-continue beta blocker.  She is having occasions when she continues to have "flutters".  We discussed increasing her metoprolol but she declined at this point.  3 MVP-not evident on most recent echo.  4 h/o CP-no recurrences.  Olga Millers, MD

## 2021-02-04 NOTE — Patient Instructions (Signed)

## 2021-03-11 ENCOUNTER — Other Ambulatory Visit: Payer: Self-pay

## 2021-03-11 DIAGNOSIS — I480 Paroxysmal atrial fibrillation: Secondary | ICD-10-CM

## 2021-03-11 MED ORDER — METOPROLOL TARTRATE 25 MG PO TABS: 25.0000 mg | ORAL_TABLET | Freq: Two times a day (BID) | ORAL | 3 refills | Status: DC

## 2021-03-25 DIAGNOSIS — H903 Sensorineural hearing loss, bilateral: Secondary | ICD-10-CM | POA: Diagnosis not present

## 2021-08-01 ENCOUNTER — Telehealth: Payer: Self-pay | Admitting: Family Medicine

## 2021-08-01 NOTE — Telephone Encounter (Signed)
Left message for patient to call back and schedule Medicare Annual Wellness Visit (AWV) either virtually or in office. Left  my Zachery Conch number [phone]   Last AWV 09/20/17  please schedule at anytime with [facility] Nurse Health Advisor 1 or 2

## 2021-08-24 ENCOUNTER — Other Ambulatory Visit: Payer: Self-pay | Admitting: Family Medicine

## 2021-08-24 DIAGNOSIS — Z1231 Encounter for screening mammogram for malignant neoplasm of breast: Secondary | ICD-10-CM

## 2021-08-27 ENCOUNTER — Ambulatory Visit (INDEPENDENT_AMBULATORY_CARE_PROVIDER_SITE_OTHER): Payer: Medicare HMO

## 2021-08-27 ENCOUNTER — Other Ambulatory Visit: Payer: Self-pay

## 2021-08-27 VITALS — BP 133/69 | HR 72 | Temp 97.6°F

## 2021-08-27 DIAGNOSIS — Z23 Encounter for immunization: Secondary | ICD-10-CM

## 2021-08-27 DIAGNOSIS — Z1231 Encounter for screening mammogram for malignant neoplasm of breast: Secondary | ICD-10-CM | POA: Diagnosis not present

## 2021-08-27 NOTE — Progress Notes (Signed)
Patient presents today for influenza vaccination.    HA: No Dizziness/lightheadedness: No BA: No Weakness/Fatigue: No  Sinus pain/pressure: No  Runny nose: No  ST: No  ShOB: No  CP: No  Palps: No Abd pain: No Dysuria: No  N/V/C/D: No   Latex allergy?: No Allergic to chicken/eggs:: No Ever had a severe reaction to the flu shot before?: No Had a fever within the last 24 hours?: No History of Guillain-Barre syndrome?: No    Immunization given IM in RD. Pt tolerated immunization well without complications.   Patient advised to take Tylenol as needed if mild symptoms, such as body aches, should occur. Advised patient that if worse symptoms should arise, to give our office a call. Advised patient if ShOB or any symptom indicating a possible anaphylactic reaction should occur, to go immediately to the nearest emergency department. Patient verbalized understanding.   

## 2021-08-27 NOTE — Patient Instructions (Signed)
Influenza (Flu) Vaccine (Inactivated or Recombinant): What You Need to Know 1. Why get vaccinated? Influenza vaccine can prevent influenza (flu). Flu is a contagious disease that spreads around the [place] every year, usually between October and May. Anyone can get the flu, but it is more dangerous for some people. Infants and young children, people 65 years and older, pregnant people, and people with certain health conditions or a weakened immune system are at greatest risk of flu complications. Pneumonia, bronchitis, sinus infections, and ear infections are examples of flu-related complications. If you have a medical condition, such as heart disease, cancer, or diabetes, flu can make it worse. Flu can cause fever and chills, sore throat, muscle aches, fatigue, cough, headache, and runny or stuffy nose. Some people may have vomiting and diarrhea, though this is more common in children than adults. In an average year, thousands of people in the [place] die from flu, and many more are hospitalized. Flu vaccine prevents millions of illnesses and flu-related visits to the doctor each year. 2. Influenza vaccines CDC recommends everyone 6 months and older get vaccinated every flu season. Children 6 months through 8 years of age may need 2 doses during a single flu season. Everyone else needs only 1 dose each flu season. It takes about 2 weeks for protection to develop after vaccination. There are many flu viruses, and they are always changing. Each year a new flu vaccine is made to protect against the influenza viruses believed to be likely to cause disease in the upcoming flu season. Even when the vaccine doesn't exactly match these viruses, it may still provide some protection. Influenza vaccine does not cause flu. Influenza vaccine may be given at the same time as other vaccines. 3. Talk with your health care provider Tell your vaccination provider if the person getting the vaccine: Has had  an allergic reaction after a previous dose of influenza vaccine, or has any severe, life-threatening allergies Has ever had Guillain-Barr Syndrome (also called "GBS") In some cases, your health care provider may decide to postpone influenza vaccination until a future visit. Influenza vaccine can be administered at any time during pregnancy. People who are or will be pregnant during influenza season should receive inactivated influenza vaccine. People with minor illnesses, such as a cold, may be vaccinated. People who are moderately or severely ill should usually wait until they recover before getting influenza vaccine. Your health care provider can give you more information. 4. Risks of a vaccine reaction Soreness, redness, and swelling where the shot is given, fever, muscle aches, and headache can happen after influenza vaccination. There may be a very small increased risk of Guillain-Barr Syndrome (GBS) after inactivated influenza vaccine (the flu shot). Young children who get the flu shot along with pneumococcal vaccine (PCV13) and/or DTaP vaccine at the same time might be slightly more likely to have a seizure caused by fever. Tell your health care provider if a child who is getting flu vaccine has ever had a seizure. People sometimes faint after medical procedures, including vaccination. Tell your provider if you feel dizzy or have vision changes or ringing in the ears. As with any medicine, there is a very remote chance of a vaccine causing a severe allergic reaction, other serious injury, or death. 5. What if there is a serious problem? An allergic reaction could occur after the vaccinated person leaves the clinic. If you see signs of a severe allergic reaction (hives, swelling of the face and throat, difficulty breathing,   a fast heartbeat, dizziness, or weakness), call 9-1-1 and get the person to the nearest hospital. For other signs that concern you, call your health care provider. Adverse  reactions should be reported to the Vaccine Adverse Event Reporting System (VAERS). Your health care provider will usually file this report, or you can do it yourself. Visit the VAERS website at www.vaers.hhs.gov or call 1-800-822-7967. VAERS is only for reporting reactions, and VAERS staff members do not give medical advice. 6. The National Vaccine Injury Compensation Program The National Vaccine Injury Compensation Program (VICP) is a federal program that was created to compensate people who may have been injured by certain vaccines. Claims regarding alleged injury or death due to vaccination have a time limit for filing, which may be as short as two years. Visit the VICP website at www.hrsa.gov/vaccinecompensation or call 1-800-338-2382 to learn about the program and about filing a claim. 7. How can I learn more? Ask your health care provider. Call your local or state health department. Visit the website of the Food and Drug Administration (FDA) for vaccine package inserts and additional information at www.fda.gov/vaccines-blood-biologics/vaccines. Contact the Centers for Disease Control and Prevention (CDC): Call 1-800-232-4636 (1-800-CDC-INFO) or Visit CDC's website at www.cdc.gov/flu. Vaccine Information Statement Inactivated Influenza Vaccine (06/27/2020) This information is not intended to replace advice given to you by your health care provider. Make sure you discuss any questions you have with your health care provider. Document Revised: 08/14/2020 Document Reviewed: 08/14/2020 Elsevier Patient Education  2022 Elsevier Inc.  

## 2021-09-01 NOTE — Progress Notes (Signed)
Please call patient. Normal mammogram.  Repeat in 1 year.  

## 2021-11-18 ENCOUNTER — Telehealth (INDEPENDENT_AMBULATORY_CARE_PROVIDER_SITE_OTHER): Payer: Medicare HMO | Admitting: Family Medicine

## 2021-11-18 ENCOUNTER — Encounter: Payer: Self-pay | Admitting: Family Medicine

## 2021-11-18 VITALS — BP 146/70 | HR 90 | Temp 98.1°F

## 2021-11-18 DIAGNOSIS — R300 Dysuria: Secondary | ICD-10-CM

## 2021-11-18 MED ORDER — SULFAMETHOXAZOLE-TRIMETHOPRIM 800-160 MG PO TABS: 1.0000 | ORAL_TABLET | Freq: Two times a day (BID) | ORAL | 0 refills | Status: DC

## 2021-11-18 NOTE — Progress Notes (Signed)
Sxs started a few weeks ago. She states that she got some burning and soreness, frequency. She began drinking cranberry juice and had an old prescription for Doxy that she took.   These sxs went away but came back recently. She denies fevers,diarrhea or blood in her urine.    Reviewed pt's allergy list.

## 2021-11-18 NOTE — Progress Notes (Addendum)
° °  Virtual Visit via Telephone Note  I connected with Stephanie Rollins on 11/18/21 at  2:00 PM EST by telephone and verified that I am speaking with the correct person using two identifiers.   I discussed the limitations, risks, security and privacy concerns of performing an evaluation and management service by telephone and the availability of in person appointments. I also discussed with the patient that there may be a patient responsible charge related to this service. The patient expressed understanding and agreed to proceed.  Patient location: at home Provider loccation: In office   Subjective:    CC:   Chief Complaint  Patient presents with   Urinary Tract Infection    HPI: Sxs started a few weeks ago. She states that she got some burning and soreness, frequency. Pressure with voiding.  She began drinking cranberry juice and drinking plenty of water.   These sxs went away but came back recently. She denies fevers,diarrhea or blood in her urine.  some right back pain, occ sharp.     Past medical history, Surgical history, Family history not pertinant except as noted below, Social history, Allergies, and medications have been entered into the medical record, reviewed, and corrections made.   Review of Systems: No fevers, chills, night sweats, weight loss, chest pain, or shortness of breath.   Objective:    General: Speaking clearly in complete sentences without any shortness of breath.  Alert and oriented x3.  Normal judgment. No apparent acute distress.    Impression and Recommendations:    Problem List Items Addressed This Visit   None   Dysuria  - suspect UTI aced on symptoms she is overall low risk.  No recent catheterizations etc.  No fevers.  We will go ahead and treat with Bactrim DS per patient preference.  If not improving or symptoms recur pretty quickly then she will need to come in and do a urine culture for further work-up.  Meds ordered this encounter   Medications   sulfamethoxazole-trimethoprim (BACTRIM DS) 800-160 MG tablet    Sig: Take 1 tablet by mouth 2 (two) times daily.    Dispense:  6 tablet    Refill:  0    Meds ordered this encounter  Medications   sulfamethoxazole-trimethoprim (BACTRIM DS) 800-160 MG tablet    Sig: Take 1 tablet by mouth 2 (two) times daily.    Dispense:  6 tablet    Refill:  0     I discussed the assessment and treatment plan with the patient. The patient was provided an opportunity to ask questions and all were answered. The patient agreed with the plan and demonstrated an understanding of the instructions.   The patient was advised to call back or seek an in-person evaluation if the symptoms worsen or if the condition fails to improve as anticipated.  I provided 12 minutes of non-face-to-face time during this encounter.   Nani Gasser, MD

## 2021-11-19 ENCOUNTER — Telehealth: Payer: Self-pay | Admitting: *Deleted

## 2021-11-19 NOTE — Telephone Encounter (Signed)
Pt called and stated that she picked up the Bactrim however the pills are too big. She stated that she cut this in half and was able to swallow the 1st half the 2nd half she crushed it up and mixed it in her oatmeal.  She was concerned because she heard that you weren't supposed to crush antibiotics. She said that she did call the pharmacy and asked them about this and they told her that it would be ok to crush this up as long as she was able to get all of the medication in. She stated that she wanted a second opinion to be sure that she was doing this right and because of all of her allergies and problems with taking larger pills. I advised her that she was and confirmed this with Dr. Linford Arnold. Advised to call back if she has any more questions or concerns

## 2021-12-07 ENCOUNTER — Emergency Department (INDEPENDENT_AMBULATORY_CARE_PROVIDER_SITE_OTHER)
Admission: EM | Admit: 2021-12-07 | Discharge: 2021-12-07 | Disposition: A | Discharge status: Home / Self Care | Payer: Medicare Other | Source: Home / Self Care | Attending: Family Medicine | Admitting: Family Medicine

## 2021-12-07 ENCOUNTER — Other Ambulatory Visit: Payer: Self-pay

## 2021-12-07 ENCOUNTER — Encounter: Payer: Self-pay | Admitting: Emergency Medicine

## 2021-12-07 DIAGNOSIS — R300 Dysuria: Secondary | ICD-10-CM | POA: Diagnosis not present

## 2021-12-07 DIAGNOSIS — Z881 Allergy status to other antibiotic agents status: Secondary | ICD-10-CM

## 2021-12-07 DIAGNOSIS — N3010 Interstitial cystitis (chronic) without hematuria: Secondary | ICD-10-CM

## 2021-12-07 LAB — POCT URINALYSIS DIP (MANUAL ENTRY)
Bilirubin, UA: NEGATIVE
Glucose, UA: NEGATIVE mg/dL
Ketones, POC UA: NEGATIVE mg/dL
Leukocytes, UA: NEGATIVE
Nitrite, UA: NEGATIVE
Protein Ur, POC: NEGATIVE mg/dL
Spec Grav, UA: <=1.005 — AB (ref 1.010–1.025)
Urobilinogen, UA: 0.2 E.U./dL
pH, UA: 6 (ref 5.0–8.0)

## 2021-12-07 MED ORDER — PHENAZOPYRIDINE HCL 200 MG PO TABS: ORAL_TABLET | ORAL | 0 refills | Status: DC

## 2021-12-07 NOTE — ED Triage Notes (Signed)
Patient presents to Urgent Care with complaints of dysuria, low back pain, pelvic pressure since 1 day ago. Patient reports seeing PCP about 3 weeks ago and treated for UTI.Finished the Bactrium DS on 11/24/21.  Patient states symptoms were improving until this morning. This morning had decreased output but other symptoms started. Has been staying hydrated.

## 2021-12-07 NOTE — Discharge Instructions (Signed)
Drink lots of water Take the Pyridium once or twice a day to help with urinary burning.  This will make your urine bright orange.  You should wear a panty liner or pad so it does not stain your clothing Your urine culture result will be available in 2 to 3 days.  You will be called if an antibiotic is needed You do need to follow-up with the urology specialist

## 2021-12-07 NOTE — ED Provider Notes (Signed)
[facility]    CSN: [id number] Arrival date & time: 12/07/21  1704      History   Chief Complaint Chief Complaint  Patient presents with   Dysuria    HPI Stephanie Rollins is a 71 y.o. female.   HPI  Patient states she has a many year history of chronic interstitial cystitis with hematuria.  She has seen more than 1 urologist.  She has more than 1 treatment for this.  She currently is not on any medication for her cystitis and does not seeing a urologist.  She had a phone visit with her primary care doctor in December 2022.  Based on the dysuria symptomatology she was given a course of Septra.  Patient states she took this medication but did not see improvement in the dysuria.  She is here today requesting a urine specimen, culture.  She has plans to see a new urologist in [place], [place] appointment is 2 weeks.  I told her the 2 weeks was adequate follow-up  Past Medical History:  Diagnosis Date   Anal fistula    Atrial fibrillation (HCC)    Dyslipidemia    Hearing loss    75%   Hypertension    IBS (irritable bowel syndrome)    remote history   Interstitial cystitis    Kidney stone    Menopausal state    MVP (mitral valve prolapse)    Paroxysmal A-fib (HCC)    status post ablation   Swallowing difficulty    problems for second/need dentures    Patient Active Problem List   Diagnosis Date Noted   Acquired trigger finger of both index fingers 10/13/2020   Acute right ankle pain 06/17/2020   Renal cyst, right 10/29/2019   Spondylosis of lumbar spine 02/10/2019   Chronic pain disorder 02/10/2019   Age related osteoporosis 02/10/2019   Severe protein-calorie malnutrition ([place]) 01/08/2019   Compression fracture of fifth lumbar vertebra ([place]) 10/31/2018   Hearing loss 09/20/2017   Non-traumatic compression fracture of T7 thoracic vertebra, sequela 12/21/2016   Osteoarthritis of both hands 05/03/2016   Mitral valve prolapse 08/07/2014   Rib pain on  left side 07/07/2012   PREMATURE VENTRICULAR CONTRACTIONS 10/07/2010   GERD 11/13/2008   Osteoporosis 10/15/2008   INSULIN RESISTANCE SYNDROME 09/25/2008   ECZEMA 12/29/2007   CYSTITIS, CHRONIC INTERSTITIAL 11/17/2006   KNEE PAIN, BILATERAL 10/24/2006   HYPERCHOLESTEROLEMIA 08/30/2006   ANXIETY 08/30/2006   ATRIAL FIBRILLATION 08/30/2006   RHINITIS, ALLERGIC 08/30/2006    Past Surgical History:  Procedure Laterality Date   CARDIAC ELECTROPHYSIOLOGY [place]     for A fib   CESAREAN [place], 1973   x 2   NASAL SEPTUM SURGERY  1977   pulmonary vein isolation  06-2006   procedure for AFIB /[facility]    OB History   No obstetric history on file.      Home Medications    Prior to Admission medications   Medication Sig Start Date End Date Taking? Authorizing Provider  AMBULATORY NON FORMULARY MEDICATION 4-prong cane dx ambulatory dysfunction 09/15/20  Yes Emeterio Reeve, DO  aspirin 81 MG chewable tablet Chew 81 mg by mouth daily.    Yes [provider]  CALCIUM-VITAMIN D PO Take by mouth.   Yes [provider]  metoprolol tartrate (LOPRESSOR) 25 MG tablet Take 1 tablet (25 mg total) by mouth 2 (two) times daily. 03/11/21  Yes Lelon Perla, MD  phenazopyridine (PYRIDIUM) 200 MG  tablet Take 1 pill 2 times a day as needed for urinary burning 12/07/21  Yes Raylene Everts, MD    Family History Family History  Problem Relation Age of Onset   Hypertension Mother    Diabetes Maternal Grandmother    Heart disease Brother    Mastocytosis Brother    Stroke Paternal Aunt    Heart attack Father    Heart disease Sister        x 2   Colon cancer Neg Hx    Esophageal cancer Neg Hx     Social History Social History   Tobacco Use   Smoking status: Never   Smokeless tobacco: Never  Vaping Use   Vaping Use: Never used  Substance Use Topics   Alcohol use: No   Drug use: No     Allergies   Amoxicillin, Ampicillin,  Azithromycin, Cephalexin, Clarithromycin, Codeine, Flecainide, Shellfish allergy, Shellfish-derived products, and Penicillins   Review of Systems Review of Systems See HPI  Physical Exam Triage Vital Signs ED Triage Vitals  Enc Vitals Group     BP 12/07/21 1722 114/74     Pulse Rate 12/07/21 1722 74     Resp 12/07/21 1722 16     Temp 12/07/21 1722 98.4 F (36.9 C)     Temp Source 12/07/21 1722 Oral     SpO2 12/07/21 1722 98 %     Weight --      Height --      Head Circumference --      Peak Flow --      Pain Score 12/07/21 1721 6     Pain Loc --      Pain Edu? --      Excl. in [place]? --    No data found.  Updated Vital Signs BP 114/74 (BP Location: Right Arm)    Pulse 74    Temp 98.4 F (36.9 C) (Oral)    Resp 16    SpO2 98%      Physical Exam Constitutional:      General: She is not in acute distress.    Appearance: Normal appearance. She is well-developed.     Comments: Patient is then  HENT:     Head: Normocephalic and atraumatic.     Nose:     Comments: Mask is in place Eyes:     Conjunctiva/sclera: Conjunctivae normal.     Pupils: Pupils are equal, round, and reactive to light.  Cardiovascular:     Rate and Rhythm: Normal rate.  Pulmonary:     Effort: Pulmonary effort is normal. No respiratory distress.  Abdominal:     General: There is no distension.     Palpations: Abdomen is soft.     Tenderness: There is no abdominal tenderness. There is no right CVA tenderness or left CVA tenderness.  Musculoskeletal:        General: Normal range of motion.     Cervical back: Normal range of motion.  Skin:    General: Skin is warm and dry.  Neurological:     Mental Status: She is alert.  Psychiatric:        Mood and Affect: Mood normal.        Behavior: Behavior normal.     UC Treatments / Results  Labs (all labs ordered are listed, but only abnormal results are displayed) Labs Reviewed  POCT URINALYSIS DIP (MANUAL ENTRY) - Abnormal; Notable for the  following components:      Result Value  Color, UA light yellow (*)    Spec Grav, UA <=1.005 (*)    Blood, UA small (*)    All other components within normal limits  URINE CULTURE    EKG   Radiology No results found.  Procedures Procedures (including critical care time)  Medications Ordered in UC Medications - No data to display  Initial Impression / Assessment and Plan / UC Course  I have reviewed the triage vital signs and the nursing notes.  Pertinent labs & imaging results that were available during my care of the patient were reviewed by me and considered in my medical decision making (see chart for details).     Urinalysis continues to show trace of blood.  No other findings to suggest infection.  Urine culture is sent.  I am not can place her on another antibiotic.  Her last couple of urine cultures have had no growth and I suspect this is the case again today.  I have encouraged urology follow-up.  I have given her a limited number of Pyridium to take as needed for discomfort and warned her that it will stain her urine orange Final Clinical Impressions(s) / UC Diagnoses   Final diagnoses:  Dysuria  Interstitial cystitis  Allergy to multiple antibiotics     Discharge Instructions      Drink lots of water Take the Pyridium once or twice a day to help with urinary burning.  This will make your urine bright orange.  You should wear a panty liner or pad so it does not stain your clothing Your urine culture result will be available in 2 to 3 days.  You will be called if an antibiotic is needed You do need to follow-up with the urology specialist   ED Prescriptions     Medication Sig Dispense Auth. Provider   phenazopyridine (PYRIDIUM) 200 MG tablet Take 1 pill 2 times a day as needed for urinary burning 14 tablet Meda Coffee Jennette Banker, MD      PDMP not reviewed this encounter.   Raylene Everts, MD 12/07/21 [phone]

## 2021-12-08 LAB — URINE CULTURE
MICRO NUMBER:: 12877459
Result:: NO GROWTH
SPECIMEN QUALITY:: ADEQUATE

## 2022-02-01 ENCOUNTER — Ambulatory Visit: Payer: Medicare Other | Admitting: Cardiology

## 2022-02-04 DIAGNOSIS — N3010 Interstitial cystitis (chronic) without hematuria: Secondary | ICD-10-CM | POA: Diagnosis not present

## 2022-02-04 DIAGNOSIS — R300 Dysuria: Secondary | ICD-10-CM | POA: Diagnosis not present

## 2022-02-10 DIAGNOSIS — K5730 Diverticulosis of large intestine without perforation or abscess without bleeding: Secondary | ICD-10-CM | POA: Diagnosis not present

## 2022-02-10 DIAGNOSIS — Z79899 Other long term (current) drug therapy: Secondary | ICD-10-CM | POA: Diagnosis not present

## 2022-02-10 DIAGNOSIS — Z880 Allergy status to penicillin: Secondary | ICD-10-CM | POA: Diagnosis not present

## 2022-02-10 DIAGNOSIS — Z8679 Personal history of other diseases of the circulatory system: Secondary | ICD-10-CM | POA: Diagnosis not present

## 2022-02-10 DIAGNOSIS — R0789 Other chest pain: Secondary | ICD-10-CM | POA: Diagnosis not present

## 2022-02-10 DIAGNOSIS — R7989 Other specified abnormal findings of blood chemistry: Secondary | ICD-10-CM | POA: Diagnosis not present

## 2022-02-10 DIAGNOSIS — J9811 Atelectasis: Secondary | ICD-10-CM | POA: Diagnosis not present

## 2022-02-10 DIAGNOSIS — J984 Other disorders of lung: Secondary | ICD-10-CM | POA: Diagnosis not present

## 2022-02-10 DIAGNOSIS — Z885 Allergy status to narcotic agent status: Secondary | ICD-10-CM | POA: Diagnosis not present

## 2022-02-10 DIAGNOSIS — I4891 Unspecified atrial fibrillation: Secondary | ICD-10-CM | POA: Diagnosis not present

## 2022-02-10 DIAGNOSIS — I517 Cardiomegaly: Secondary | ICD-10-CM | POA: Diagnosis not present

## 2022-02-10 DIAGNOSIS — Z7982 Long term (current) use of aspirin: Secondary | ICD-10-CM | POA: Diagnosis not present

## 2022-02-10 DIAGNOSIS — R109 Unspecified abdominal pain: Secondary | ICD-10-CM | POA: Diagnosis not present

## 2022-02-10 DIAGNOSIS — K8020 Calculus of gallbladder without cholecystitis without obstruction: Secondary | ICD-10-CM | POA: Diagnosis not present

## 2022-02-10 DIAGNOSIS — R079 Chest pain, unspecified: Secondary | ICD-10-CM | POA: Diagnosis not present

## 2022-02-11 DIAGNOSIS — R7989 Other specified abnormal findings of blood chemistry: Secondary | ICD-10-CM | POA: Diagnosis not present

## 2022-02-11 DIAGNOSIS — I517 Cardiomegaly: Secondary | ICD-10-CM | POA: Diagnosis not present

## 2022-02-11 DIAGNOSIS — J9811 Atelectasis: Secondary | ICD-10-CM | POA: Diagnosis not present

## 2022-02-12 ENCOUNTER — Telehealth: Payer: Self-pay | Admitting: General Practice

## 2022-02-12 NOTE — Telephone Encounter (Signed)
Transition Care Management Unsuccessful Follow-up Telephone Call ? ?Date of discharge and from where:  02/11/22 from [facility] ? ?Attempts:  1st Attempt ? ?Reason for unsuccessful TCM follow-up call:  Left voice message ? ?  ?

## 2022-02-15 NOTE — Telephone Encounter (Signed)
Transition Care Management Unsuccessful Follow-up Telephone Call ? ?Date of discharge and from where:  02/11/22 from [facility] ? ?Attempts:  2nd Attempt ? ?Reason for unsuccessful TCM follow-up call:  Left voice message ? ?  ?

## 2022-02-17 ENCOUNTER — Encounter: Payer: Self-pay | Admitting: Cardiology

## 2022-02-17 NOTE — Progress Notes (Signed)
? ? ? ? ?HPI:FU SVT as well as atrial fibrillation and mitral valve prolapse. Cardiac catheterization in September of 2002 showed normal coronary arteries and normal LV function with an ejection fraction of 65%. Myoview in 2007 showed EF 70 and normal perfusion. She has had a previous ablation of her atrial fibrillation in August 2007 at [facility]. She has also had recurrent palpitations secondary to PVCs treated with beta blockade. Event monitor 12/14 showed sinus with brief PAT. Monitor January 2020 showed sinus rhythm with PACs, PVCs, brief PAT and 4/7 beat runs of nonsustained ventricular tachycardia.   Echocardiogram March 2020 showed normal LV function.  Seen at [facility] March 2023 with chest pain.  CTA showed no pulmonary embolus or dissection.  Troponins normal, hemoglobin 13, potassium 3.5 with creatinine 0.62, liver functions normal.  Since I last saw her she has had occasional chest tightness not related to activities.  She has some dyspnea on exertion.  There is no pedal edema, syncope.  She occasionally has brief palpitations described as a skipped and flutter but not sustained. ? ?Current Outpatient Medications  ?Medication Sig Dispense Refill  ? aspirin 81 MG chewable tablet Chew 81 mg by mouth daily.     ? CALCIUM-VITAMIN D PO Take by mouth.    ? metoprolol tartrate (LOPRESSOR) 25 MG tablet Take 1 tablet (25 mg total) by mouth 2 (two) times daily. 180 tablet 3  ? AMBULATORY NON FORMULARY MEDICATION 4-prong cane dx ambulatory dysfunction 1 Units prn  ? ?No current facility-administered medications for this visit.  ? ? ? ?Past Medical History:  ?Diagnosis Date  ? Anal fistula   ? Atrial fibrillation (HCC)   ? Dyslipidemia   ? Hearing loss   ? 75%  ? Hypertension   ? IBS (irritable bowel syndrome)   ? remote history  ? Interstitial cystitis   ? Kidney stone   ? Menopausal state   ? MVP (mitral valve prolapse)   ? Paroxysmal A-fib (HCC)   ? status post ablation  ? Swallowing difficulty   ? problems  for second/need dentures  ? ? ?Past Surgical History:  ?Procedure Laterality Date  ? CARDIAC ELECTROPHYSIOLOGY MAPPING AND ABLATION    ? for A fib  ? CESAREAN SECTION  1971, 1973  ? x 2  ? NASAL SEPTUM SURGERY  1977  ? pulmonary vein isolation  06-2006  ? procedure for AFIB /[facility]  ? ? ?Social History  ? ?Socioeconomic History  ? Marital status: Divorced  ?  Spouse name: Not on file  ? Number of children: 2  ? Years of education: Not on file  ? Highest education level: Not on file  ?Occupational History  ? Occupation: retired  ?Tobacco Use  ? Smoking status: Never  ? Smokeless tobacco: Never  ?Vaping Use  ? Vaping Use: Never used  ?Substance and Sexual Activity  ? Alcohol use: No  ? Drug use: No  ? Sexual activity: Not on file  ?  Comment: unemployed, GED, divorced,2 adult children, no caffeine, no regular exercise  ?Other Topics Concern  ? Not on file  ?Social History Narrative  ? Not on file  ? ?Social Determinants of Health  ? ?Financial Resource Strain: Not on file  ?Food Insecurity: Not on file  ?Transportation Needs: Not on file  ?Physical Activity: Not on file  ?Stress: Not on file  ?Social Connections: Not on file  ?Intimate Partner Violence: Not on file  ? ? ?Family History  ?Problem Relation Age  of Onset  ? Hypertension Mother   ? Diabetes Maternal Grandmother   ? Heart disease Brother   ? Mastocytosis Brother   ? Stroke Paternal Aunt   ? Heart attack Father   ? Heart disease Sister   ?     x 2  ? Colon cancer Neg Hx   ? Esophageal cancer Neg Hx   ? ? ?ROS: no fevers or chills, productive cough, hemoptysis, dysphasia, odynophagia, melena, hematochezia, dysuria, hematuria, rash, seizure activity, orthopnea, PND, pedal edema, claudication. Remaining systems are negative. ? ?Physical Exam: ?Well-developed well-nourished in no acute distress.  ?Skin is warm and dry.  ?HEENT is normal.  ?Neck is supple.  ?Chest is clear to auscultation with normal expansion.  ?Cardiovascular exam is regular rate and  rhythm.  ?Abdominal exam nontender or distended. No masses palpated. ?Extremities show no edema. ?neuro grossly intact ? ?ECG-normal sinus rhythm at a rate of 69, no ST changes.  Personally reviewed ? ?A/P ? ?1 chest pain-recently seen at [facility] with chest pain.  Troponins were normal and CTA showed no pulmonary embolus.  Electrocardiogram today shows no ST changes and symptoms atypical.  We will not pursue further ischemia evaluation at this point. ? ?2 history of paroxysmal atrial fibrillation-status post ablation.  She has had no documented recurrences.  We will continue beta-blocker. ? ?3 history of recurrent palpitations-continue beta-blocker. ? ?4 question history of mitral valve prolapse-not evident on most recent echocardiogram. ? ?Olga Millers, MD ? ? ? ?

## 2022-02-17 NOTE — Telephone Encounter (Signed)
Transition Care Management Unsuccessful Follow-up Telephone Call ? ?Date of discharge and from where:  02/11/22 from [facility] ? ?Attempts:  3rd Attempt ? ?Reason for unsuccessful TCM follow-up call:  No answer/busy ? ?  ?

## 2022-02-24 ENCOUNTER — Encounter: Payer: Self-pay | Admitting: Cardiology

## 2022-02-24 ENCOUNTER — Ambulatory Visit (INDEPENDENT_AMBULATORY_CARE_PROVIDER_SITE_OTHER): Payer: Medicare Other | Admitting: Cardiology

## 2022-02-24 VITALS — BP 128/78 | HR 69 | Ht 62.5 in | Wt 129.0 lb

## 2022-02-24 DIAGNOSIS — I341 Nonrheumatic mitral (valve) prolapse: Secondary | ICD-10-CM

## 2022-02-24 DIAGNOSIS — R002 Palpitations: Secondary | ICD-10-CM | POA: Diagnosis not present

## 2022-02-24 DIAGNOSIS — I480 Paroxysmal atrial fibrillation: Secondary | ICD-10-CM | POA: Diagnosis not present

## 2022-02-24 MED ORDER — METOPROLOL TARTRATE 25 MG PO TABS: 25.0000 mg | ORAL_TABLET | Freq: Two times a day (BID) | ORAL | 3 refills | Status: DC

## 2022-02-24 NOTE — Patient Instructions (Signed)

## 2022-03-02 ENCOUNTER — Encounter: Payer: Self-pay | Admitting: Family Medicine

## 2022-03-03 DIAGNOSIS — R300 Dysuria: Secondary | ICD-10-CM | POA: Diagnosis not present

## 2022-03-03 DIAGNOSIS — N3010 Interstitial cystitis (chronic) without hematuria: Secondary | ICD-10-CM | POA: Diagnosis not present

## 2022-03-04 ENCOUNTER — Other Ambulatory Visit: Payer: Self-pay | Admitting: Urology

## 2022-03-04 DIAGNOSIS — N369 Urethral disorder, unspecified: Secondary | ICD-10-CM

## 2022-03-30 ENCOUNTER — Telehealth (HOSPITAL_BASED_OUTPATIENT_CLINIC_OR_DEPARTMENT_OTHER): Payer: Self-pay

## 2022-03-30 ENCOUNTER — Other Ambulatory Visit (HOSPITAL_BASED_OUTPATIENT_CLINIC_OR_DEPARTMENT_OTHER): Payer: Self-pay | Admitting: Urology

## 2022-03-30 DIAGNOSIS — N369 Urethral disorder, unspecified: Secondary | ICD-10-CM

## 2022-04-03 ENCOUNTER — Other Ambulatory Visit (HOSPITAL_BASED_OUTPATIENT_CLINIC_OR_DEPARTMENT_OTHER): Payer: Self-pay | Admitting: Urology

## 2022-04-03 ENCOUNTER — Ambulatory Visit (HOSPITAL_BASED_OUTPATIENT_CLINIC_OR_DEPARTMENT_OTHER)
Admission: RE | Admit: 2022-04-03 | Discharge: 2022-04-03 | Disposition: A | Discharge status: Home / Self Care | Payer: Medicare Other | Source: Ambulatory Visit | Attending: Urology | Admitting: Urology

## 2022-04-03 DIAGNOSIS — K5730 Diverticulosis of large intestine without perforation or abscess without bleeding: Secondary | ICD-10-CM | POA: Diagnosis not present

## 2022-04-03 DIAGNOSIS — Z87448 Personal history of other diseases of urinary system: Secondary | ICD-10-CM | POA: Diagnosis not present

## 2022-04-03 DIAGNOSIS — N369 Urethral disorder, unspecified: Secondary | ICD-10-CM

## 2022-04-08 DIAGNOSIS — H353132 Nonexudative age-related macular degeneration, bilateral, intermediate dry stage: Secondary | ICD-10-CM | POA: Diagnosis not present

## 2022-05-12 ENCOUNTER — Emergency Department (INDEPENDENT_AMBULATORY_CARE_PROVIDER_SITE_OTHER): Payer: Medicare Other

## 2022-05-12 ENCOUNTER — Emergency Department (INDEPENDENT_AMBULATORY_CARE_PROVIDER_SITE_OTHER)
Admission: EM | Admit: 2022-05-12 | Discharge: 2022-05-12 | Disposition: A | Discharge status: Home / Self Care | Payer: Medicare Other | Source: Home / Self Care

## 2022-05-12 DIAGNOSIS — M545 Low back pain, unspecified: Secondary | ICD-10-CM | POA: Diagnosis not present

## 2022-05-12 DIAGNOSIS — M62830 Muscle spasm of back: Secondary | ICD-10-CM | POA: Diagnosis not present

## 2022-05-12 DIAGNOSIS — M546 Pain in thoracic spine: Secondary | ICD-10-CM

## 2022-05-12 DIAGNOSIS — M549 Dorsalgia, unspecified: Secondary | ICD-10-CM

## 2022-05-12 MED ORDER — BACLOFEN 10 MG PO TABS: 10.0000 mg | ORAL_TABLET | Freq: Three times a day (TID) | ORAL | 0 refills | Status: DC

## 2022-05-12 MED ORDER — PREDNISONE 10 MG (21) PO TBPK: ORAL_TABLET | Freq: Every day | ORAL | 0 refills | Status: DC

## 2022-05-12 NOTE — ED Provider Notes (Signed)
[facility]    CSN: [id number] Arrival date & time: 05/12/22  1314      History   Chief Complaint Chief Complaint  Patient presents with   Back Pain    HPI Stephanie Rollins is a 71 y.o. female.   HPI Very pleasant 71 year old female presents with mid back pain for 6 days.  Patient believes that she pulled upper back muscles (right-sided) and grocery store and has used heat and ice as needed.  Reports taking Tylenol at 12:15 PM this afternoon.  Reports pain as 10 of 10.  Patient reports history of osteoporosis and compression fractures.  MH significant for paroxysmal A-fib, IBS, and kidney stone.  Past Medical History:  Diagnosis Date   Anal fistula    Atrial fibrillation (HCC)    Dyslipidemia    Hearing loss    75%   Hypertension    IBS (irritable bowel syndrome)    remote history   Interstitial cystitis    Kidney stone    Menopausal state    MVP (mitral valve prolapse)    Paroxysmal A-fib (HCC)    status post ablation   Swallowing difficulty    problems for second/need dentures    Patient Active Problem List   Diagnosis Date Noted   Acquired trigger finger of both index fingers 10/13/2020   Acute right ankle pain 06/17/2020   Renal cyst, right 10/29/2019   Spondylosis of lumbar spine 02/10/2019   Chronic pain disorder 02/10/2019   Age related osteoporosis 02/10/2019   Severe protein-calorie malnutrition (HCC) 01/08/2019   Compression fracture of fifth lumbar vertebra (HCC) 10/31/2018   Hearing loss 09/20/2017   Non-traumatic compression fracture of T7 thoracic vertebra, sequela 12/21/2016   Osteoarthritis of both hands 05/03/2016   Mitral valve prolapse 08/07/2014   Rib pain on left side 07/07/2012   PREMATURE VENTRICULAR CONTRACTIONS 10/07/2010   GERD 11/13/2008   Osteoporosis 10/15/2008   INSULIN RESISTANCE SYNDROME 09/25/2008   ECZEMA 12/29/2007   CYSTITIS, CHRONIC INTERSTITIAL 11/17/2006   KNEE PAIN, BILATERAL 10/24/2006    HYPERCHOLESTEROLEMIA 08/30/2006   ANXIETY 08/30/2006   ATRIAL FIBRILLATION 08/30/2006   RHINITIS, ALLERGIC 08/30/2006    Past Surgical History:  Procedure Laterality Date   CARDIAC ELECTROPHYSIOLOGY MAPPING AND ABLATION     for A fib   CESAREAN SECTION  1971, 1973   x 2   NASAL SEPTUM SURGERY  1977   pulmonary vein isolation  06-2006   procedure for AFIB /[facility]    OB History   No obstetric history on file.      Home Medications    Prior to Admission medications   Medication Sig Start Date End Date Taking? Authorizing Provider  baclofen (LIORESAL) 10 MG tablet Take 1 tablet (10 mg total) by mouth 3 (three) times daily. 05/12/22  Yes Trevor Iha, FNP  predniSONE (STERAPRED UNI-PAK 21 TAB) 10 MG (21) TBPK tablet Take by mouth daily. Take 6 tabs by mouth daily  for 2 days, then 5 tabs for 2 days, then 4 tabs for 2 days, then 3 tabs for 2 days, 2 tabs for 2 days, then 1 tab by mouth daily for 2 days 05/12/22  Yes Trevor Iha, FNP  AMBULATORY NON FORMULARY MEDICATION 4-prong cane dx ambulatory dysfunction 09/15/20   Sunnie Nielsen, DO  aspirin 81 MG chewable tablet Chew 81 mg by mouth daily.     [provider]  CALCIUM-VITAMIN D PO Take by mouth.    [provider]  metoprolol  tartrate (LOPRESSOR) 25 MG tablet Take 1 tablet (25 mg total) by mouth 2 (two) times daily. 02/24/22   Lewayne Bunting, MD    Family History Family History  Problem Relation Age of Onset   Hypertension Mother    Diabetes Maternal Grandmother    Heart disease Brother    Mastocytosis Brother    Stroke Paternal Aunt    Heart attack Father    Heart disease Sister        x 2   Colon cancer Neg Hx    Esophageal cancer Neg Hx     Social History Social History   Tobacco Use   Smoking status: Never   Smokeless tobacco: Never  Vaping Use   Vaping Use: Never used  Substance Use Topics   Alcohol use: No   Drug use: No     Allergies   Amoxicillin, Ampicillin,  Azithromycin, Cephalexin, Clarithromycin, Codeine, Flecainide, Shellfish allergy, Shellfish-derived products, and Penicillins   Review of Systems Review of Systems  Musculoskeletal:  Positive for back pain.     Physical Exam Triage Vital Signs ED Triage Vitals  Enc Vitals Group     BP 05/12/22 1326 (!) 145/80     Pulse Rate 05/12/22 1326 75     Resp 05/12/22 1326 17     Temp 05/12/22 1326 98.6 F (37 C)     Temp Source 05/12/22 1326 Oral     SpO2 05/12/22 1326 97 %     Weight --      Height --      Head Circumference --      Peak Flow --      Pain Score 05/12/22 1325 10     Pain Loc --      Pain Edu? --      Excl. in GC? --    No data found.  Updated Vital Signs BP (!) 145/80 (BP Location: Right Arm)   Pulse 75   Temp 98.6 F (37 C) (Oral)   Resp 17   SpO2 97%    Physical Exam Vitals and nursing note reviewed.  Constitutional:      Appearance: Normal appearance. She is normal weight.  HENT:     Head: Normocephalic and atraumatic.     Mouth/Throat:     Mouth: Mucous membranes are moist.     Pharynx: Oropharynx is clear.  Eyes:     Extraocular Movements: Extraocular movements intact.     Conjunctiva/sclera: Conjunctivae normal.     Pupils: Pupils are equal, round, and reactive to light.  Cardiovascular:     Rate and Rhythm: Normal rate and regular rhythm.     Pulses: Normal pulses.     Heart sounds: Normal heart sounds.  Pulmonary:     Effort: Pulmonary effort is normal.     Breath sounds: Normal breath sounds. No wheezing, rhonchi or rales.  Musculoskeletal:     Cervical back: Normal range of motion and neck supple.     Comments: Superior thoracic spine: TTP over spinous processes T3-T5, bilateral paraspinous muscles, and semi-spinalis thoracic muscles, kyphosis of spine related to compression fractures mild dextroscoliosis noted of lumbar spine  Skin:    General: Skin is warm and dry.  Neurological:     General: No focal deficit present.     Mental  Status: She is alert and oriented to person, place, and time.      UC Treatments / Results  Labs (all labs ordered are listed, but only abnormal results are  displayed) Labs Reviewed - No data to display  EKG   Radiology DG Lumbar Spine Complete  Result Date: 05/12/2022 CLINICAL DATA:  Back pain EXAM: LUMBAR SPINE - COMPLETE 4+ VIEW COMPARISON:  10/13/2020 FINDINGS: There is decrease in height of multiple lumbar and lower thoracic vertebral bodies. Most of the fractures appear stable. There is possible slight worsening of compression in the body of L4 vertebra. Alignment of posterior margins of vertebral bodies is unremarkable. Osteopenia is seen in bony structures. Degenerative changes are noted with bony spurs and facet hypertrophy at multiple levels. Arterial calcifications are seen in the soft tissues. There is mild dextroscoliosis in the lumbar spine. There are linear densities in the lower lung fields suggesting scarring. There is elevation of left hemidiaphragm. There is moderate gaseous distention of colon. IMPRESSION: Compression fractures seen in multiple lumbar and lower thoracic vertebral bodies. There is possible mild worsening of compression fracture in the body of L4 vertebra. Osteopenia and degenerative changes are noted. Electronically Signed   By: Ernie Avena M.D.   On: 05/12/2022 14:36   DG Thoracic Spine 2 View  Result Date: 05/12/2022 CLINICAL DATA:  Back pain EXAM: THORACIC SPINE 2 VIEWS COMPARISON:  None Available. FINDINGS: Compression fractures in the midthoracic spine with severe loss of vertebral body height. Compression fractures involve T6, T7-T8 with severe compression of the T7 vertebral body. No subluxation. Kyphosis of the spine related to the compression fractures. IMPRESSION: Severe chronic compression fractures midthoracic spine. No acute findings Electronically Signed   By: Genevive Bi M.D.   On: 05/12/2022 14:28    Procedures Procedures  (including critical care time)  Medications Ordered in UC Medications - No data to display  Initial Impression / Assessment and Plan / UC Course  I have reviewed the triage vital signs and the nursing notes.  Pertinent labs & imaging results that were available during my care of the patient were reviewed by me and considered in my medical decision making (see chart for details).     MDM: 1.  Acute bilateral thoracic back pain-x-rays of thoracic/lumbar spine reveals above.  Patient has declined as needed pain medication (Tramadol), Rx'd Sterapred-Uni pak and Baclofen. Advised/informed patient of thoracic/lumbar sacral spine x-ray results with hard copy provided to patient.  Instructed patient to take medications as directed with food to completion.  Advised may take Baclofen daily or as needed for accompanying muscle spasms.  Encouraged patient to increase daily water intake while taking these medications.  Advised patient to avoid offending activities involving affected area of upper/mid back for the next 7 to 10 days.  Advised patient if symptoms worsen and/or unresolved please follow-up with PCP or here for further evaluation.  Patient discharged home, hemodynamically stable.  Final Clinical Impressions(s) / UC Diagnoses   Final diagnoses:  Acute bilateral thoracic back pain  Muscle spasm of back     Discharge Instructions      Advised/informed patient of thoracic/lumbar sacral spine x-ray results with hard copy provided to patient.  Instructed patient to take medications as directed with food to completion.  Advised may take Baclofen daily or as needed for accompanying muscle spasms.  Encouraged patient to increase daily water intake while taking these medications.  Advised patient to avoid offending activities involving affected area of upper/mid back for the next 7 to 10 days.  Advised patient if symptoms worsen and/or unresolved please follow-up with PCP or here for further  evaluation.     ED Prescriptions  Medication Sig Dispense Auth. Provider   baclofen (LIORESAL) 10 MG tablet Take 1 tablet (10 mg total) by mouth 3 (three) times daily. 30 each Trevor Iha, FNP   predniSONE (STERAPRED UNI-PAK 21 TAB) 10 MG (21) TBPK tablet Take by mouth daily. Take 6 tabs by mouth daily  for 2 days, then 5 tabs for 2 days, then 4 tabs for 2 days, then 3 tabs for 2 days, 2 tabs for 2 days, then 1 tab by mouth daily for 2 days 42 tablet Trevor Iha, FNP      PDMP not reviewed this encounter.   Trevor Iha, FNP 05/12/22 1534

## 2022-05-12 NOTE — Discharge Instructions (Addendum)
Advised/informed patient of thoracic/lumbar sacral spine x-ray results with hard copy provided to patient.  Instructed patient to take medications as directed with food to completion.  Advised may take Baclofen daily or as needed for accompanying muscle spasms.  Encouraged patient to increase daily water intake while taking these medications.  Advised patient to avoid offending activities involving affected area of upper/mid back for the next 7 to 10 days.  Advised patient if symptoms worsen and/or unresolved please follow-up with PCP or here for further evaluation.

## 2022-05-12 NOTE — ED Triage Notes (Signed)
Pt c/o mid back pain since Thurs. Hx of osteoporosis and compression fxs. Says she pulled it in the grocery store. Heat and ice prn. Tylenol at 1215pm. Pain 10/10

## 2022-05-28 DIAGNOSIS — H5213 Myopia, bilateral: Secondary | ICD-10-CM | POA: Diagnosis not present

## 2022-06-14 ENCOUNTER — Encounter: Payer: Self-pay | Admitting: *Deleted

## 2022-06-14 ENCOUNTER — Other Ambulatory Visit: Payer: Self-pay | Admitting: *Deleted

## 2022-06-14 ENCOUNTER — Ambulatory Visit: Payer: Medicare Other | Admitting: Sports Medicine

## 2022-06-14 ENCOUNTER — Telehealth: Payer: Self-pay | Admitting: *Deleted

## 2022-06-14 NOTE — Telephone Encounter (Signed)
Please call pt she is would like to schedule a wellness exam and wasn't sure if this should be with Dr. Linford Arnold or thru the [facility] wellness nurse.   She stated that if she cannot be reached by phone ok to send a mychart message.

## 2022-06-14 NOTE — Progress Notes (Unsigned)
   Acute Office Visit  Subjective:     Patient ID: Stephanie Rollins, female    DOB: [birth year], 71 y.o.   MRN: [id number]  No chief complaint on file.   HPI Patient is in today for an acute visit for back pain. She believes she may have thrown out her back.   Per epic chart review she had a similar episode at the end of June and was seen in the ED and given prednisone and baclofen. Xrays were also taken at that time which showed vertebral compression fractures involving T6, T7-T8 with severe compression of T7 vertebral body. Xrays also showed worsening compression of L4 vertebrae.  Dexa scan from May 2021 shows Tscore of -3.8.  Did take fosamax?    ROS      Objective:    There were no vitals taken for this visit. {Vitals History (Optional):23777}  Physical Exam  No results found for any visits on 06/15/22.      Assessment & Plan:   Problem List Items Addressed This Visit   None   No orders of the defined types were placed in this encounter.   No follow-ups on file.  Charlton Amor, DO

## 2022-06-14 NOTE — Telephone Encounter (Signed)
Left patient voicemail to call back to the office that she can have a Physical with Dr Linford Arnold that [facility] will cover or a Medicare Well visit with [facility] that Medicare will cover. AMUCK

## 2022-06-15 ENCOUNTER — Encounter: Payer: Self-pay | Admitting: Family Medicine

## 2022-06-15 ENCOUNTER — Ambulatory Visit (INDEPENDENT_AMBULATORY_CARE_PROVIDER_SITE_OTHER): Payer: Medicare Other | Admitting: Family Medicine

## 2022-06-15 ENCOUNTER — Ambulatory Visit (INDEPENDENT_AMBULATORY_CARE_PROVIDER_SITE_OTHER): Payer: Medicare Other

## 2022-06-15 VITALS — BP 121/73 | HR 78 | Ht 62.5 in | Wt 116.0 lb

## 2022-06-15 DIAGNOSIS — M8080XS Other osteoporosis with current pathological fracture, unspecified site, sequela: Secondary | ICD-10-CM | POA: Diagnosis not present

## 2022-06-15 DIAGNOSIS — R634 Abnormal weight loss: Secondary | ICD-10-CM | POA: Insufficient documentation

## 2022-06-15 DIAGNOSIS — Z862 Personal history of diseases of the blood and blood-forming organs and certain disorders involving the immune mechanism: Secondary | ICD-10-CM

## 2022-06-15 DIAGNOSIS — R002 Palpitations: Secondary | ICD-10-CM

## 2022-06-15 DIAGNOSIS — E876 Hypokalemia: Secondary | ICD-10-CM | POA: Diagnosis not present

## 2022-06-15 NOTE — Assessment & Plan Note (Addendum)
-   ordered EKG in clinic today  - ordered zio patch monitor since pt says they occur mainly at night - EKG shows NSR with HR of 71. New T wave changes in V2 compared to previous EKG done in April. Pt denies chest pain

## 2022-06-15 NOTE — Progress Notes (Unsigned)
Enrolled patient for a 7 day Zio XT monitor to be mailed to patients home   [place] to read

## 2022-06-15 NOTE — Assessment & Plan Note (Signed)
-   labs indicated hypokalemia of 3.5; will repeat today

## 2022-06-15 NOTE — Assessment & Plan Note (Signed)
-   pt refuses fosamax treatment and does understand risk vs benefits - will order Vit D level to see if we need a high supplemental dose

## 2022-06-15 NOTE — Assessment & Plan Note (Signed)
-   sent referral for nutrition to see if we can get patient's weight up  - discussed boost/ensure options with patient

## 2022-06-16 DIAGNOSIS — H524 Presbyopia: Secondary | ICD-10-CM | POA: Diagnosis not present

## 2022-06-16 LAB — TSH+FREE T4: TSH W/REFLEX TO FT4: 1.32 mIU/L (ref 0.40–4.50)

## 2022-06-16 LAB — B12 AND FOLATE PANEL
Folate: 23.7 ng/mL
Vitamin B-12: 1101 pg/mL — ABNORMAL HIGH (ref 200–1100)

## 2022-06-16 LAB — IRON,TIBC AND FERRITIN PANEL
%SAT: 15 % (calc) — ABNORMAL LOW (ref 16–45)
Ferritin: 106 ng/mL (ref 16–288)
Iron: 57 ug/dL (ref 45–160)
TIBC: 380 mcg/dL (calc) (ref 250–450)

## 2022-06-16 LAB — CBC
HCT: 40.4 % (ref 35.0–45.0)
Hemoglobin: 13.3 g/dL (ref 11.7–15.5)
MCH: 30.5 pg (ref 27.0–33.0)
MCHC: 32.9 g/dL (ref 32.0–36.0)
MCV: 92.7 fL (ref 80.0–100.0)
MPV: 11.4 fL (ref 7.5–12.5)
Platelets: 85 10*3/uL — ABNORMAL LOW (ref 140–400)
RBC: 4.36 10*6/uL (ref 3.80–5.10)
RDW: 12.1 % (ref 11.0–15.0)
WBC: 7.4 10*3/uL (ref 3.8–10.8)

## 2022-06-17 DIAGNOSIS — R002 Palpitations: Secondary | ICD-10-CM | POA: Diagnosis not present

## 2022-06-18 ENCOUNTER — Telehealth: Payer: Self-pay

## 2022-06-18 DIAGNOSIS — R1310 Dysphagia, unspecified: Secondary | ICD-10-CM

## 2022-06-18 DIAGNOSIS — R634 Abnormal weight loss: Secondary | ICD-10-CM

## 2022-06-18 NOTE — Telephone Encounter (Signed)
-----   Message from Charlton Amor, DO sent at 06/18/2022  8:10 AM EDT ----- Let pt know: B12 level is elevated. I don't see where she is getting any B12 injections or taking supplements but can we double check this. If so, she will need to stop the B12 supplements/injections. Regular blood levels are normal and her iron levels do not show any signs of anemia. Thyroid is normal. Thanks!

## 2022-06-18 NOTE — Telephone Encounter (Signed)
Pt called stating she was unable to see the nutritionist she was referred to because they aren't covered by her insurance. Pt states she researched a company that will assist her with a mechanical soft diet. I called to confirm and they stated they could help with these services, they would need an Rx for the mechanical soft diet, the clinical note, narrative note and most recent noted faxed over. Can we proceed with this?  [facility] Attn: Enteral Dept Fax: [phone] Phone: [phone]

## 2022-06-21 MED ORDER — AMBULATORY NON FORMULARY MEDICATION: 0 refills | Status: AC

## 2022-06-21 NOTE — Telephone Encounter (Signed)
RX written and information faxed to [facility] at [phone] with confirmation received.

## 2022-07-01 DIAGNOSIS — R002 Palpitations: Secondary | ICD-10-CM | POA: Diagnosis not present

## 2022-07-02 NOTE — Telephone Encounter (Signed)
Pt called stating she was unable to get mechanical soft foods through [facility]. Pt requested information be sent to "[employer]", I called to verify this information and was told they only accept referrals from health Insurance's. Pt was notified of this and recommended her to contact her insurance to assist with this.

## 2022-07-04 ENCOUNTER — Other Ambulatory Visit: Payer: Self-pay

## 2022-07-04 ENCOUNTER — Emergency Department (HOSPITAL_COMMUNITY): Payer: Medicare Other

## 2022-07-04 ENCOUNTER — Encounter (HOSPITAL_COMMUNITY): Payer: Self-pay

## 2022-07-04 ENCOUNTER — Emergency Department (HOSPITAL_COMMUNITY)
Admission: EM | Admit: 2022-07-04 | Discharge: 2022-07-04 | Disposition: A | Discharge status: Home / Self Care | Payer: Medicare Other | Attending: Emergency Medicine | Admitting: Emergency Medicine

## 2022-07-04 DIAGNOSIS — R1310 Dysphagia, unspecified: Secondary | ICD-10-CM

## 2022-07-04 DIAGNOSIS — I700 Atherosclerosis of aorta: Secondary | ICD-10-CM | POA: Diagnosis not present

## 2022-07-04 DIAGNOSIS — Z7982 Long term (current) use of aspirin: Secondary | ICD-10-CM | POA: Diagnosis not present

## 2022-07-04 DIAGNOSIS — R1084 Generalized abdominal pain: Secondary | ICD-10-CM | POA: Diagnosis not present

## 2022-07-04 DIAGNOSIS — Z79899 Other long term (current) drug therapy: Secondary | ICD-10-CM | POA: Insufficient documentation

## 2022-07-04 DIAGNOSIS — R109 Unspecified abdominal pain: Secondary | ICD-10-CM | POA: Diagnosis not present

## 2022-07-04 LAB — CBC WITH DIFFERENTIAL/PLATELET
Abs Immature Granulocytes: 0.03 10*3/uL (ref 0.00–0.07)
Basophils Absolute: 0 10*3/uL (ref 0.0–0.1)
Basophils Relative: 0 %
Eosinophils Absolute: 0 10*3/uL (ref 0.0–0.5)
Eosinophils Relative: 0 %
HCT: 39.7 % (ref 36.0–46.0)
Hemoglobin: 13.1 g/dL (ref 12.0–15.0)
Immature Granulocytes: 0 %
Lymphocytes Relative: 15 %
Lymphs Abs: 1.3 10*3/uL (ref 0.7–4.0)
MCH: 30.5 pg (ref 26.0–34.0)
MCHC: 33 g/dL (ref 30.0–36.0)
MCV: 92.3 fL (ref 80.0–100.0)
Monocytes Absolute: 0.7 10*3/uL (ref 0.1–1.0)
Monocytes Relative: 8 %
Neutro Abs: 6.7 10*3/uL (ref 1.7–7.7)
Neutrophils Relative %: 77 %
Platelets: 236 10*3/uL (ref 150–400)
RBC: 4.3 MIL/uL (ref 3.87–5.11)
RDW: 12.9 % (ref 11.5–15.5)
WBC: 8.7 10*3/uL (ref 4.0–10.5)
nRBC: 0 % (ref 0.0–0.2)

## 2022-07-04 LAB — BASIC METABOLIC PANEL
Anion gap: 9 (ref 5–15)
BUN: 7 mg/dL — ABNORMAL LOW (ref 8–23)
CO2: 25 mmol/L (ref 22–32)
Calcium: 9.7 mg/dL (ref 8.9–10.3)
Chloride: 105 mmol/L (ref 98–111)
Creatinine, Ser: 0.73 mg/dL (ref 0.44–1.00)
GFR, Estimated: >60 mL/min (ref >60)
Glucose, Bld: 122 mg/dL — ABNORMAL HIGH (ref 70–99)
Potassium: 4 mmol/L (ref 3.5–5.1)
Sodium: 139 mmol/L (ref 135–145)

## 2022-07-04 LAB — URINALYSIS, ROUTINE W REFLEX MICROSCOPIC
Bilirubin Urine: NEGATIVE
Glucose, UA: NEGATIVE mg/dL
Ketones, ur: 20 mg/dL — AB
Leukocytes,Ua: NEGATIVE
Nitrite: NEGATIVE
Protein, ur: 30 mg/dL — AB
Specific Gravity, Urine: 1.019 (ref 1.005–1.030)
pH: 5 (ref 5.0–8.0)

## 2022-07-04 MED ORDER — SODIUM CHLORIDE 0.9 % IV BOLUS: 1000.0000 mL | Freq: Once | INTRAVENOUS | Status: DC

## 2022-07-04 NOTE — ED Notes (Signed)
Pt states understanding of dc instructions, importance of follow up.  Pt denies questions or concerns upon dc. Pt declined wheelchair assistance upon dc. Pt ambulated out of ed w/ steady gait. No belongings left in room upon dc.  

## 2022-07-04 NOTE — ED Notes (Signed)
Attempted to call daughter as pt requested. Phone number incorrect in chart.

## 2022-07-04 NOTE — ED Provider Notes (Signed)
Stephanie Rollins [facility]-EMERGENCY DEPT Provider Note   CSN: [id number] Arrival date & time: 07/04/22  1143     History  Chief Complaint  Patient presents with   Dysphagia    Stephanie Rollins is a 71 y.o. female.  Patient presents to the emergency department complaining of difficulty swallowing over the past few weeks.  Patient states that she had some prednisone due to to back pain.  She states that she told her provider not to give her the prednisone but they recommended it and that she took it.  She states that when she came off the prednisone she began to have difficulty swallowing and a feeling that food was sitting in the middle of her stomach.  Patient states that her primary care has been working with her on soft foods.  She presents today for evaluation of her difficulty swallowing, also complains of some generalized abdominal pain.  Patient denies constipation, diarrhea, nausea, vomiting, chest pain, shortness of breath.  Patient states that her abdominal pain is moderate at times when she drinks or eats.  She states that she last had a bowel movement 2 days ago but is passing flatus.  Medical history significant for swallowing difficulty, paroxysmal A-fib, hypertension, compression fracture of T7 thoracic vertebra, compression fracture of fifth lumbar vertebra  HPI     Home Medications Prior to Admission medications   Medication Sig Start Date End Date Taking? Authorizing Provider  AMBULATORY NON FORMULARY MEDICATION 4-prong cane dx ambulatory dysfunction 09/15/20   Sunnie Nielsen, DO  AMBULATORY NON FORMULARY MEDICATION Mechanical soft diet DX: R13.10, R63.4 06/21/22   Charlton Amor, DO  aspirin 81 MG chewable tablet Chew 81 mg by mouth daily.     [provider]  baclofen (LIORESAL) 10 MG tablet Take 1 tablet (10 mg total) by mouth 3 (three) times daily. 05/12/22   Trevor Iha, FNP  CALCIUM-VITAMIN D PO Take by mouth.    [provider]   metoprolol tartrate (LOPRESSOR) 25 MG tablet Take 1 tablet (25 mg total) by mouth 2 (two) times daily. 02/24/22   Lewayne Bunting, MD      Allergies    Amoxicillin, Ampicillin, Azithromycin, Cephalexin, Clarithromycin, Codeine, Flecainide, Iodinated contrast media, Prednisone, Shellfish allergy, Shellfish-derived products, and Penicillins    Review of Systems   Review of Systems  HENT:  Positive for trouble swallowing.   Respiratory:  Negative for shortness of breath.   Cardiovascular:  Negative for chest pain.  Gastrointestinal:  Positive for abdominal pain. Negative for constipation, diarrhea, nausea and vomiting.  Genitourinary:  Negative for dysuria.  Neurological:  Negative for headaches.    Physical Exam Updated Vital Signs BP 121/83 (BP Location: Left Arm)   Pulse 83   Temp 98.6 F (37 C) (Oral)   Resp 16   Ht 5\' 2"  (1.575 m)   Wt 52.6 kg   SpO2 95%   BMI 21.21 kg/m  Physical Exam Vitals and nursing note reviewed.  Constitutional:      General: She is not in acute distress.    Appearance: She is normal weight.  HENT:     Head: Normocephalic and atraumatic.     Mouth/Throat:     Mouth: Mucous membranes are moist.     Pharynx: Oropharynx is clear. No pharyngeal swelling or uvula swelling.  Eyes:     Conjunctiva/sclera: Conjunctivae normal.  Cardiovascular:     Rate and Rhythm: Normal rate and regular rhythm.     Pulses: Normal pulses.  Heart sounds: Normal heart sounds.  Pulmonary:     Effort: Pulmonary effort is normal.     Breath sounds: Normal breath sounds.  Abdominal:     General: Bowel sounds are normal. There is no distension.     Palpations: Abdomen is soft.     Tenderness: There is abdominal tenderness (Generalized tenderness).  Musculoskeletal:        General: Normal range of motion.     Cervical back: Normal range of motion and neck supple.  Skin:    General: Skin is warm and dry.     Capillary Refill: Capillary refill takes less than 2  seconds.  Neurological:     Mental Status: She is alert and oriented to person, place, and time.  Psychiatric:        Behavior: Behavior normal.     ED Results / Procedures / Treatments   Labs (all labs ordered are listed, but only abnormal results are displayed) Labs Reviewed  BASIC METABOLIC PANEL - Abnormal; Notable for the following components:      Result Value   Glucose, Bld 122 (*)    BUN 7 (*)    All other components within normal limits  URINALYSIS, ROUTINE W REFLEX MICROSCOPIC - Abnormal; Notable for the following components:   Color, Urine AMBER (*)    APPearance HAZY (*)    Hgb urine dipstick SMALL (*)    Ketones, ur 20 (*)    Protein, ur 30 (*)    Bacteria, UA RARE (*)    All other components within normal limits  CBC WITH DIFFERENTIAL/PLATELET    EKG None  Radiology CT ABDOMEN PELVIS WO CONTRAST  Result Date: 07/04/2022 CLINICAL DATA:  Abdominal pain, acute, nonlocalized. Patient complains of difficulty swallowing for 1 week and postprandial discomfort. EXAM: CT ABDOMEN AND PELVIS WITHOUT CONTRAST TECHNIQUE: Multidetector CT imaging of the abdomen and pelvis was performed following the standard protocol without IV contrast. RADIATION DOSE REDUCTION: This exam was performed according to the departmental dose-optimization program which includes automated exposure control, adjustment of the mA and/or kV according to patient size and/or use of iterative reconstruction technique. COMPARISON:  CT of the abdomen and pelvis without contrast 10/30/2018 FINDINGS: Lower chest: Areas of linear atelectasis or scarring are present both lung bases. Bronchiectasis noted at the right lung base. Heart is enlarged. No significant pleural or pericardial effusion is present. Hepatobiliary: Liver is unremarkable. Layering gallstones are present the neck of the gallbladder without focal inflammation. The common bile duct is within normal limits. Pancreas: Unremarkable. No pancreatic ductal  dilatation or surrounding inflammatory changes. Spleen: Normal in size without focal abnormality. Adrenals/Urinary Tract: Adrenal glands are normal bilaterally. Kidneys and ureters are within normal limits. No stone or mass lesion is present. The urinary bladder is within normal limits. Stomach/Bowel: Stomach and duodenum are within normal limits. Small bowel is unremarkable. Terminal ileum normal. The appendix is visualized and within normal limits. The ascending and transverse colon are normal. Descending colon is unremarkable. Sigmoid colon is within normal limits. Vascular/Lymphatic: Atherosclerotic calcifications are present in the aorta and branch vessels without aneurysm. No significant adenopathy is present. Reproductive: Uterus and bilateral adnexa are unremarkable. Other: No abdominal wall hernia or abnormality. No abdominopelvic ascites. Musculoskeletal: Superior endplate fractures at L2, L3, L4 and L5 are new since the prior study but do not appear acute. Remote fracture of L1 is stable. T12 fracture is progressed since the prior study but does not appear acute. Grade 1 anterolisthesis is present at  L4-5. The bony pelvis is within normal limits. The hips are located and within normal limits. IMPRESSION: 1. No acute or focal lesion to explain the patient's symptoms. 2. Cholelithiasis without evidence for cholecystitis. 3. Multiple compression fractures in the lumbar spine are new since the prior study but do not appear acute. 4. Remote fracture of L1 is stable. 5. Grade 1 anterolisthesis at L4-5. 6. Cardiomegaly without failure. 7. Aortic Atherosclerosis (ICD10-I70.0). Electronically Signed   By: Marin Roberts M.D.   On: 07/04/2022 13:53   DG Neck Soft Tissue  Result Date: 07/04/2022 CLINICAL DATA:  Dysphagia. EXAM: NECK SOFT TISSUES - 1+ VIEW COMPARISON:  None Available. FINDINGS: Degenerative changes in the cervical spine. No soft tissue abnormalities identified. IMPRESSION: No cause for  dysphagia identified. Electronically Signed   By: Gerome Sam III M.D.   On: 07/04/2022 13:47    Procedures Procedures    Medications Ordered in ED Medications - No data to display   ED Course/ Medical Decision Making/ A&P                           Medical Decision Making Amount and/or Complexity of Data Reviewed Labs: ordered. Radiology: ordered.   This patient presents to the ED for concern of dysphagia, this involves an extensive number of treatment options, and is a complaint that carries with it a high risk of complications and morbidity.  The differential diagnosis includes neuromuscular dysphagia, stricture dysphagia, stroke, esophageal dysphagia, oropharyngeal dysphagia   Co morbidities that complicate the patient evaluation  History of difficulty swallowing   Additional history obtained:  External records from outside source obtained and reviewed including cardiology visits for palpitations   Lab Tests:  I Ordered, and personally interpreted labs.  The pertinent results include: Grossly normal CBC, BUN 7, no signs of infection on urinalysis   Imaging Studies ordered:  I ordered imaging studies including CT abdomen pelvis and plain films of the neck soft tissue I independently visualized and interpreted imaging which showed  1. No acute or focal lesion to explain the patient's symptoms.  2. Cholelithiasis without evidence for cholecystitis.  3. Multiple compression fractures in the lumbar spine are new since  the prior study but do not appear acute.  4. Remote fracture of L1 is stable.  5. Grade 1 anterolisthesis at L4-5.  6. Cardiomegaly without failure.  7. Aortic Atherosclerosis  No cause for dysphagia identified. I agree with the radiologist interpretation   Test / Admission - Considered:  The patient has no critical findings on CT or plain films of the neck that would explain her dysphagia or abdominal pain.  Her abdominal pain is subsided.   This patient would likely benefit from an outpatient swallow study.  Recommend patient follow-up with her primary care team to discuss scheduling this.  Patient is already using a soft diet and is able to tolerate oral intake of water and soft foods such as mashed potatoes.  No sign of stroke at this time.  No indication for admission.  Discharge home        Final Clinical Impression(s) / ED Diagnoses Final diagnoses:  Dysphagia, unspecified type    Rx / DC Orders ED Discharge Orders     None         Pamala Duffel 07/04/22 1651    Derwood Kaplan, MD 07/10/22 0745

## 2022-07-04 NOTE — ED Notes (Signed)
I called patient to recheck vital signs and no one responded 

## 2022-07-04 NOTE — Discharge Instructions (Addendum)
You were seen today for evaluation of due to difficulty swallowing.  Your CT scan and plain x-rays of your neck had no findings to explain your symptoms.  I recommend following up with your primary care provider to discuss possibly scheduling a swallow study in the future.  Continue to proceed with the soft food diet.  Return to the emergency department if you develop any life-threatening conditions

## 2022-07-04 NOTE — ED Triage Notes (Signed)
Pt reports difficulty swallowing x 1 week states was taking prednisone and started having difficulty swallowing after starting medication. Also c/o abd discomfort after eating. Denies n/v/d no bm x 3 days

## 2022-07-04 NOTE — ED Provider Triage Note (Signed)
Emergency Medicine Provider Triage Evaluation Note  Stephanie Rollins , a 71 y.o. female  was evaluated in triage.  Pt complains of difficulty swallowing and abdominal pain.  Patient states that she believes she began to have problems swallowing starting after she finished her prednisone taper approximately 1 month ago. Her abdominal pain is general in nature but she complains of significant pain with palpation. She also has had no bowel movement in two days. She is able to swallow water and mash potatoes.  Review of Systems  Positive: Dysphagia, abdominal pain Negative: Chest pain, shortness of breath  Physical Exam  BP (!) 133/92 (BP Location: Left Arm)   Pulse (!) 121   Temp 98.4 F (36.9 C) (Oral)   Resp 18   Ht 5\' 2"  (1.575 m)   Wt 52.6 kg   SpO2 97%   BMI 21.21 kg/m  Gen:   Awake, no distress   Resp:  Normal effort  MSK:   Moves extremities without difficulty  Other:    Medical Decision Making  Medically screening exam initiated at 1:06 PM.  Appropriate orders placed.  was informed that the remainder of the evaluation will be completed by another provider, this initial triage assessment does not replace that evaluation, and the importance of remaining in the ED until their evaluation is complete.     Adelfa Koh, PA-C 07/04/22 1310

## 2022-07-12 ENCOUNTER — Encounter (HOSPITAL_COMMUNITY): Payer: Self-pay | Admitting: Emergency Medicine

## 2022-07-12 ENCOUNTER — Emergency Department (HOSPITAL_COMMUNITY): Payer: Medicare Other

## 2022-07-12 ENCOUNTER — Emergency Department (HOSPITAL_COMMUNITY)
Admission: EM | Admit: 2022-07-12 | Discharge: 2022-07-13 | Disposition: A | Discharge status: Home / Self Care | Payer: Medicare Other | Attending: Emergency Medicine | Admitting: Emergency Medicine

## 2022-07-12 ENCOUNTER — Other Ambulatory Visit: Payer: Self-pay

## 2022-07-12 DIAGNOSIS — I10 Essential (primary) hypertension: Secondary | ICD-10-CM | POA: Diagnosis not present

## 2022-07-12 DIAGNOSIS — R630 Anorexia: Secondary | ICD-10-CM | POA: Insufficient documentation

## 2022-07-12 DIAGNOSIS — R1310 Dysphagia, unspecified: Secondary | ICD-10-CM | POA: Insufficient documentation

## 2022-07-12 DIAGNOSIS — I700 Atherosclerosis of aorta: Secondary | ICD-10-CM | POA: Diagnosis not present

## 2022-07-12 LAB — BASIC METABOLIC PANEL
Anion gap: 12 (ref 5–15)
BUN: 7 mg/dL — ABNORMAL LOW (ref 8–23)
CO2: 24 mmol/L (ref 22–32)
Calcium: 9.9 mg/dL (ref 8.9–10.3)
Chloride: 100 mmol/L (ref 98–111)
Creatinine, Ser: 0.92 mg/dL (ref 0.44–1.00)
GFR, Estimated: >60 mL/min (ref >60)
Glucose, Bld: 112 mg/dL — ABNORMAL HIGH (ref 70–99)
Potassium: 3.6 mmol/L (ref 3.5–5.1)
Sodium: 136 mmol/L (ref 135–145)

## 2022-07-12 LAB — CBC
HCT: 42.1 % (ref 36.0–46.0)
Hemoglobin: 13.8 g/dL (ref 12.0–15.0)
MCH: 30.1 pg (ref 26.0–34.0)
MCHC: 32.8 g/dL (ref 30.0–36.0)
MCV: 91.7 fL (ref 80.0–100.0)
Platelets: 189 10*3/uL (ref 150–400)
RBC: 4.59 MIL/uL (ref 3.87–5.11)
RDW: 12.9 % (ref 11.5–15.5)
WBC: 7.8 10*3/uL (ref 4.0–10.5)
nRBC: 0 % (ref 0.0–0.2)

## 2022-07-12 NOTE — ED Provider Triage Note (Signed)
Emergency Medicine Provider Triage Evaluation Note  Stephanie Rollins , a 71 y.o. female  was evaluated in triage.  Pt complains of difficulty swallowing, reports that she had some pill esophagitis that started a few weeks ago, has only been able to tolerate soft foods and few fluids.  Patient worried about worsening dysphagia, muscle spasm, reports that she has been able to tolerate less than usual today, concerned that she is dehydrated, she also wants to talk to [place] social worker about being provided a pured diet, patient reports that she is also been struggling with intermittent SVT, but does not feel like she is in SVT at this time.  Review of Systems  Positive: Dysphagia, concern for dehydration Negative: Chest pain, shortness of breath, stridor, nausea, vomiting  Physical Exam  BP (!) 146/95 (BP Location: Left Arm)   Pulse 94   Temp 98.6 F (37 C) (Oral)   Resp 16   SpO2 99%  Gen:   Awake, no distress   Resp:  Normal effort  MSK:   Moves extremities without difficulty  Other:  No significant abnormality noted in posterior pharynx, no stridor noted.  Patient in no respiratory distress.  Medical Decision Making  Medically screening exam initiated at 8:18 PM.  Appropriate orders placed.  Adelfa Koh was informed that the remainder of the evaluation will be completed by another provider, this initial triage assessment does not replace that evaluation, and the importance of remaining in the ED until their evaluation is complete.  Workup initiated   Olene Floss, [place] 07/12/22 2022

## 2022-07-12 NOTE — ED Triage Notes (Signed)
Patient reports difficulty swallowing for several days /dry mouth with low oral intake/fluid intake .

## 2022-07-13 DIAGNOSIS — R1310 Dysphagia, unspecified: Secondary | ICD-10-CM | POA: Diagnosis not present

## 2022-07-13 NOTE — ED Notes (Signed)
Lunch order placed

## 2022-07-13 NOTE — ED Provider Notes (Signed)
[facility]-EMERGENCY DEPT [facility] Emergency Department Provider Note MRN:  [id number]  Arrival date & time: 07/13/22     Chief Complaint   Poor p.o. intake / dry mouth and throat   History of Present Illness   Stephanie Rollins is a 71 y.o. year-old female with a history of hypertension presenting to the ED with chief complaint of poor p.o. intake.  Patient having progressive dysphagia over the past few weeks.  It for started having trouble swallowing solid foods and this has progressed to liquids.  Here recently, she can only drink water in small sips.  She has been doing her best to keep up with her calories but she says she is losing weight.  No pain with swallowing, no abdominal pain.  Review of Systems  A thorough review of systems was obtained and all systems are negative except as noted in the HPI and PMH.   Patient's Health History    Past Medical History:  Diagnosis Date   Anal fistula    Atrial fibrillation (HCC)    Dyslipidemia    Hearing loss    75%   Hypertension    IBS (irritable bowel syndrome)    remote history   Interstitial cystitis    Kidney stone    Menopausal state    MVP (mitral valve prolapse)    Paroxysmal A-fib (HCC)    status post ablation   Swallowing difficulty    problems for second/need dentures    Past Surgical History:  Procedure Laterality Date   CARDIAC ELECTROPHYSIOLOGY MAPPING AND ABLATION     for A fib   CESAREAN SECTION  1971, 1973   x 2   NASAL SEPTUM SURGERY  1977   pulmonary vein isolation  06-2006   procedure for AFIB /[facility]    Family History  Problem Relation Age of Onset   Hypertension Mother    Diabetes Maternal Grandmother    Heart disease Brother    Mastocytosis Brother    Stroke Paternal Aunt    Heart attack Father    Heart disease Sister        x 2   Colon cancer Neg Hx    Esophageal cancer Neg Hx     Social History   Socioeconomic History   Marital status: Divorced    Spouse name: Not on  file   Number of children: 2   Years of education: Not on file   Highest education level: Not on file  Occupational History   Occupation: retired  Tobacco Use   Smoking status: Never   Smokeless tobacco: Never  Vaping Use   Vaping Use: Never used  Substance and Sexual Activity   Alcohol use: No   Drug use: No   Sexual activity: Not on file    Comment: unemployed, GED, divorced,2 adult children, no caffeine, no regular exercise  Other Topics Concern   Not on file  Social History Narrative   Not on file   Social Determinants of Health   Financial Resource Strain: Not on file  Food Insecurity: Not on file  Transportation Needs: Not on file  Physical Activity: Not on file  Stress: Not on file  Social Connections: Not on file  Intimate Partner Violence: Not on file     Physical Exam   Vitals:   07/13/22 0445 07/13/22 0515  BP: 126/69 114/74  Pulse: 70 73  Resp: 16 17  Temp: 98 F (36.7 C)   SpO2: 99% 96%    CONSTITUTIONAL:  Chronically ill-appearing, NAD NEURO/PSYCH:  Alert and oriented x 3, no focal deficits EYES:  eyes equal and reactive ENT/NECK:  no LAD, no JVD CARDIO: Regular rate, well-perfused, normal S1 and S2 PULM:  CTAB no wheezing or rhonchi GI/GU:  non-distended, non-tender MSK/SPINE:  No gross deformities, no edema SKIN:  no rash, atraumatic   *Additional and/or pertinent findings included in MDM below  Diagnostic and Interventional Summary    EKG Interpretation  Date/Time:  Tuesday July 13 2022 04:43:10 EDT Ventricular Rate:  71 PR Interval:  108 QRS Duration: 78 QT Interval:  393 QTC Calculation: 428 R Axis:   30 Text Interpretation: Sinus rhythm Short PR interval Borderline repolarization abnormality Confirmed by Kennis Carina [phone]) on 07/13/2022 5:00:06 AM       Labs Reviewed  BASIC METABOLIC PANEL - Abnormal; Notable for the following components:      Result Value   Glucose, Bld 112 (*)    BUN 7 (*)    All other components  within normal limits  CBC    CT Soft Tissue Neck Wo Contrast  Final Result      Medications - No data to display   Procedures  /  Critical Care Procedures  ED Course and Medical Decision Making  Initial Impression and Ddx Progressive dysphagia, she feels like the food or water stops or gets stuck in her throat, does not have any chest discomfort.  Differential diagnosis includes stricture, mass.  CT neck is normal, labs reassuring, no dehydration or AKI.  Patient is appropriate for close outpatient follow-up.  Past medical/surgical history that increases complexity of ED encounter: None  Interpretation of Diagnostics I personally reviewed the laboratory assessment and my interpretation is as follows: No significant blood count or electrolyte disturbance    Patient Reassessment and Ultimate Disposition/Management     Discharge  Patient management required discussion with the following services or consulting groups:  None  Complexity of Problems Addressed Acute illness or injury that poses threat of life of bodily function  Additional Data Reviewed and Analyzed Further history obtained from: Recent Consult notes  Additional Factors Impacting ED Encounter Risk None  Elmer Sow. Pilar Plate, MD [facility] Emergency Medicine [facility] [email or web address]  Final Clinical Impressions(s) / ED Diagnoses     ICD-10-CM   1. Dysphagia, unspecified type  R13.10       ED Discharge Orders     None        Discharge Instructions Discussed with and Provided to Patient:    Discharge Instructions      You were evaluated in the Emergency Department and after careful evaluation, we did not find any emergent condition requiring admission or further testing in the hospital.  Your exam/testing today is overall reassuring.  We think you need further testing by the gastroenterologists soon.  We have messaged Dr. Barron Alvine, please call the office to schedule  follow-up.  They should be aware of your situation.  Please return to the Emergency Department if you experience any worsening of your condition.   Thank you for allowing [place] to be a part of your care.      Sabas Sous, MD 07/13/22 [phone]

## 2022-07-13 NOTE — Discharge Instructions (Signed)
You were evaluated in the Emergency Department and after careful evaluation, we did not find any emergent condition requiring admission or further testing in the hospital.  Your exam/testing today is overall reassuring.  We think you need further testing by the gastroenterologists soon.  We have messaged Dr. Barron Alvine, please call the office to schedule follow-up.  They should be aware of your situation.  Please return to the Emergency Department if you experience any worsening of your condition.   Thank you for allowing [place] to be a part of your care.

## 2022-07-13 NOTE — Discharge Planning (Signed)
RNCM consulted regarding special diet education.  RNCM placed consult to dietician to assist for more accurate and current guidelines regarding this matter.

## 2022-07-13 NOTE — Discharge Planning (Signed)
RNCM spoke with pt at bedside regarding ordering dysphagia supplies through "[employer]."  Pt was told that order needs to be sent by social work.  RNCM will contact company for clarification and assist with anything needed.  RNCM will be in contact with pt as to next steps.  Pt awaiting dietary for additional/alternate resources for dysphagia.

## 2022-07-13 NOTE — ED Notes (Signed)
Pt has not eaten all day and requested pureed meal from cafeteria. Bero MD made aware, diet order placed.

## 2022-07-14 ENCOUNTER — Encounter: Payer: Self-pay | Admitting: General Practice

## 2022-07-15 ENCOUNTER — Telehealth: Payer: Self-pay

## 2022-07-15 ENCOUNTER — Telehealth: Payer: Self-pay | Admitting: *Deleted

## 2022-07-15 NOTE — Telephone Encounter (Signed)
Pt called regarding progress on enrolling her [employer] program.  RNCM advised that I have received information form PCP office and am working to find out who her DSS case worker is because we need an authorization to start meals form completed.  RNCM will continue to follow up.

## 2022-07-15 NOTE — Telephone Encounter (Signed)
-----   Message from Shellia Cleverly, DO sent at 07/13/2022  4:27 PM EDT ----- Regarding: RE: ED Patient Thank you for reaching out!  No problem, I would be happy to get her in for expedited appointment to help her out.  Vernona Rieger, can you please get expedited appointment with me or one of the APP's to evaluate progressive dysphagia requiring recent evaluation in the ER.  Thank you!   ----- Message ----- From: Sabas Sous, MD Sent: 07/13/2022   6:07 AM EDT To: Shellia Cleverly, DO Subject: ED Patient                                     Dr. Barron Alvine,  Patient of yours here in the emergency department early morning of 8-22.  Has been having progressive dysphagia over the past few weeks, started with solids and now having trouble with liquids/water.  Did not have an indication for admission, hoping EGD or swallow study can be scheduled for her soon as an outpatient if deemed necessary.  Thanks for the help.  Elmer Sow. Pilar Plate, MD [facility] Emergency Medicine [facility] [email or web address]

## 2022-07-15 NOTE — Telephone Encounter (Signed)
Pt scheduled to see Dr. Barron Alvine 07/19/22 at 10am. Left message for pt to call back.

## 2022-07-19 ENCOUNTER — Ambulatory Visit: Payer: Medicare Other | Admitting: Gastroenterology

## 2022-07-20 NOTE — Telephone Encounter (Signed)
Pt cancelled this appt.

## 2022-07-29 ENCOUNTER — Telehealth: Payer: Self-pay

## 2022-07-29 NOTE — Telephone Encounter (Signed)
I would highly recommend a follow-up appointment from the ED visit

## 2022-08-06 NOTE — Telephone Encounter (Signed)
LVM advising pt to call our office to schedule an ER f/u visit with Dr. Linford Arnold.  Tiajuana Amass, CMA

## 2022-08-07 DIAGNOSIS — I82432 Acute embolism and thrombosis of left popliteal vein: Secondary | ICD-10-CM | POA: Diagnosis not present

## 2022-08-07 DIAGNOSIS — I4891 Unspecified atrial fibrillation: Secondary | ICD-10-CM | POA: Diagnosis not present

## 2022-08-07 DIAGNOSIS — Z880 Allergy status to penicillin: Secondary | ICD-10-CM | POA: Diagnosis not present

## 2022-08-07 DIAGNOSIS — I4892 Unspecified atrial flutter: Secondary | ICD-10-CM | POA: Diagnosis not present

## 2022-08-07 DIAGNOSIS — I824Z2 Acute embolism and thrombosis of unspecified deep veins of left distal lower extremity: Secondary | ICD-10-CM | POA: Diagnosis not present

## 2022-08-07 DIAGNOSIS — M7989 Other specified soft tissue disorders: Secondary | ICD-10-CM | POA: Diagnosis not present

## 2022-08-07 DIAGNOSIS — I82442 Acute embolism and thrombosis of left tibial vein: Secondary | ICD-10-CM | POA: Diagnosis not present

## 2022-08-07 DIAGNOSIS — Z888 Allergy status to other drugs, medicaments and biological substances status: Secondary | ICD-10-CM | POA: Diagnosis not present

## 2022-08-07 DIAGNOSIS — Z91013 Allergy to seafood: Secondary | ICD-10-CM | POA: Diagnosis not present

## 2022-08-09 ENCOUNTER — Telehealth: Payer: Self-pay | Admitting: Family Medicine

## 2022-08-09 NOTE — Telephone Encounter (Signed)
Wednesday at 1130 is okay.

## 2022-08-09 NOTE — Telephone Encounter (Signed)
Pls schedule for a hospital followup

## 2022-08-09 NOTE — Telephone Encounter (Signed)
Patient seen in er in [facility] on Saturday they saw her for a blood clot in left leg has a lot of questions and concerns would like to be seen asap please advise.

## 2022-08-10 ENCOUNTER — Ambulatory Visit (INDEPENDENT_AMBULATORY_CARE_PROVIDER_SITE_OTHER): Payer: Medicare Other | Admitting: Family Medicine

## 2022-08-10 ENCOUNTER — Encounter: Payer: Self-pay | Admitting: Family Medicine

## 2022-08-10 VITALS — BP 124/77 | HR 76 | Wt 105.0 lb

## 2022-08-10 DIAGNOSIS — I82432 Acute embolism and thrombosis of left popliteal vein: Secondary | ICD-10-CM

## 2022-08-10 DIAGNOSIS — R002 Palpitations: Secondary | ICD-10-CM | POA: Diagnosis not present

## 2022-08-10 DIAGNOSIS — I471 Supraventricular tachycardia: Secondary | ICD-10-CM

## 2022-08-10 MED ORDER — APIXABAN 2.5 MG PO TABS: 5.0000 mg | ORAL_TABLET | Freq: Two times a day (BID) | ORAL | 5 refills | Status: DC

## 2022-08-10 NOTE — Telephone Encounter (Signed)
Task completed. Patient was seen on 08/10/22 at 320 pm with provider.

## 2022-08-10 NOTE — Progress Notes (Signed)
Established Patient Office Visit  Subjective   Patient ID: Stephanie Rollins, female    DOB: [birth year]  Age: 71 y.o. MRN: [id number]  Chief Complaint  Patient presents with   Hospitalization Follow-up    HPI  Follow up blood clot in the left leg on Saturday on 08/07/2022.  Noted to have a DVT in the left popliteal and posterior tibial veins.  She was started on Eliquis 5 mg.  She did let me know that she is having significant difficulty swallowing the pill.  She wonders if she might be able to get the smaller dose and just take 2 of them.  She is still having some discomfort in her leg.  She had not traveled around that time so they are not clear what may have actually caused the DVT.  Though she is not very physically active.  She says initially her discomfort started with what she felt was just some cramps in her left leg but then it stayed sore for several days which was a little bit unusual and then she started to notice significant swelling she was trying to treat it with ice and massage she eventually ended up going to the emergency department.  More recently she has been experiencing some chest tightness and soreness underneath her right breast.  Is very sensitive to cleaners and antiseptic sprays etc. they tend to make her cough and it feels like her throat is closing.  Was also struggling with tachycardia and shakiness back in July.  She in fact just had a cardiac monitor performed that was ordered by one of my partners.  She reports that she never heard back on the results.  She does follow with Dr. Kirk Ruths and he is aware of her SVTs.  Study lites include:   Normal sinus rhythm with occasional PACs and rare PVCs.   Episodic SVT noted which was associated with symptoms.  Longest episode was 17 seconds.   No sustained arrhythmias.   No atrial fibrillation.  Patch Wear Time:  7 days and 0 hours (2023-07-27T22:54:49-0400 to 2023-08-03T23:05:33-0400)   Patient had a min HR  of 49 bpm, max HR of 190 bpm, and avg HR of 72 bpm. Predominant underlying rhythm was Sinus Rhythm. 100 Supraventricular Tachycardia runs occurred, the run with the fastest interval lasting 5 beats with a max rate of 190 bpm, the  longest lasting 17.0 secs with an avg rate of 137 bpm. Supraventricular Tachycardia was detected within +/- 45 seconds of symptomatic patient event(s). Isolated SVEs were occasional (3.1%, 18540), SVE Couplets were rare (<1.0%, 711), and SVE Triplets  were rare (<1.0%, 134). Isolated VEs were rare (<1.0%, 1285), VE Couplets were rare (<1.0%, 13), and VE Triplets were rare (<1.0%, 1).     ROS    Objective:     BP 124/77   Pulse 76   Wt 105 lb (47.6 kg)   SpO2 100%   BMI 19.20 kg/m    Physical Exam Vitals and nursing note reviewed.  Constitutional:      Appearance: She is well-developed.  HENT:     Head: Normocephalic and atraumatic.  Cardiovascular:     Rate and Rhythm: Normal rate and regular rhythm.     Heart sounds: Normal heart sounds.  Pulmonary:     Effort: Pulmonary effort is normal.     Breath sounds: Normal breath sounds.  Skin:    General: Skin is warm and dry.  Neurological:     Mental Status: She is  alert and oriented to person, place, and time.  Psychiatric:        Behavior: Behavior normal.      No results found for any visits on 08/10/22.    The 10-year ASCVD risk score (Arnett DK, et al., 2019) is: 12.4%    Assessment & Plan:   Problem List Items Addressed This Visit       Cardiovascular and Mediastinum   SVT (supraventricular tachycardia) (HCC)    She is on a beta-blocker and follows with Dr. Olga Millers.  No alarming symptoms seen on her heart monitor.  Keep regularly scheduled follow-up with Dr. Jens Som.      Relevant Medications   apixaban (ELIQUIS) 2.5 MG TABS tablet   Acute deep vein thrombosis (DVT) of popliteal vein of left lower extremity (HCC) - Primary    Continue with Eliquis.  We will send over  prescription for the 2.5 mg dose.  Just reminded her she would have to take 2 of them twice a day to be therapeutic.  But she wants to see if she can swallow those a little better as the 5 mg are getting hung she has a lot of difficulty with swallowing pills in general and has for a long time.  I did discuss with her though that her insurance may or may not cover the lower dose so just be aware of that especially at a higher quantity.  But we can certainly try and see.      Relevant Medications   apixaban (ELIQUIS) 2.5 MG TABS tablet     Other   Palpitation    Return in about 4 weeks (around 09/07/2022) for Left leg DVT.  Marland Kitchen   I spent 42 minutes on the day of the encounter to include pre-visit record review, face-to-face time with the patient and post visit ordering of test.   Nani Gasser, MD

## 2022-08-11 ENCOUNTER — Ambulatory Visit: Payer: Medicare Other | Admitting: Family Medicine

## 2022-08-13 ENCOUNTER — Telehealth: Payer: Self-pay | Admitting: Family Medicine

## 2022-08-13 DIAGNOSIS — I471 Supraventricular tachycardia: Secondary | ICD-10-CM | POA: Insufficient documentation

## 2022-08-13 NOTE — Telephone Encounter (Signed)
Patient called about levaquest stated that 5mg  tablets but having trouble with insurance paying for 2.5mg  tablets.

## 2022-08-13 NOTE — Assessment & Plan Note (Signed)
Continue with Eliquis.  We will send over prescription for the 2.5 mg dose.  Just reminded her she would have to take 2 of them twice a day to be therapeutic.  But she wants to see if she can swallow those a little better as the 5 mg are getting hung she has a lot of difficulty with swallowing pills in general and has for a long time.  I did discuss with her though that her insurance may or may not cover the lower dose so just be aware of that especially at a higher quantity.  But we can certainly try and see.

## 2022-08-13 NOTE — Telephone Encounter (Signed)
Please call patient and let her know that I did review her cardiac monitor results.  They did notice some runs of what is called SVT which is a fast heart rate at the top part of the heart.  Usually the treatment is a beta-blocker which she is already on it helps control it but it does not completely eliminate.  No other worrisome findings.  Recommend keep regularly scheduled follow-up with Dr. Kirk Ruths.

## 2022-08-13 NOTE — Telephone Encounter (Signed)
She has swllowing problems so we wrote for the smaller tabs but they may not cover that

## 2022-08-13 NOTE — Assessment & Plan Note (Signed)
She is on a beta-blocker and follows with Dr. Kirk Ruths.  No alarming symptoms seen on her heart monitor.  Keep regularly scheduled follow-up with Dr. Stanford Breed.

## 2022-08-14 ENCOUNTER — Telehealth: Payer: Self-pay

## 2022-08-14 NOTE — Telephone Encounter (Signed)
Initiated Prior authorization [id number] 2.5MG  tablets Via: Covermymeds Case/Key:[id number] Status: approved as of 08/14/22 Reason: approved through 11/21/2022 Notified Pt via: Mychart

## 2022-08-14 NOTE — Telephone Encounter (Signed)
A Prior Auth Was submitted on behalf of the pt, pt notified via mychart

## 2022-08-19 ENCOUNTER — Other Ambulatory Visit: Payer: Self-pay | Admitting: *Deleted

## 2022-08-19 DIAGNOSIS — M47816 Spondylosis without myelopathy or radiculopathy, lumbar region: Secondary | ICD-10-CM

## 2022-08-19 MED ORDER — AMBULATORY NON FORMULARY MEDICATION: 0 refills | Status: DC

## 2022-08-27 ENCOUNTER — Other Ambulatory Visit: Payer: Self-pay

## 2022-08-27 ENCOUNTER — Ambulatory Visit (INDEPENDENT_AMBULATORY_CARE_PROVIDER_SITE_OTHER): Payer: Medicare Other

## 2022-08-27 DIAGNOSIS — R0989 Other specified symptoms and signs involving the circulatory and respiratory systems: Secondary | ICD-10-CM

## 2022-08-27 DIAGNOSIS — R059 Cough, unspecified: Secondary | ICD-10-CM | POA: Diagnosis not present

## 2022-08-27 DIAGNOSIS — M47816 Spondylosis without myelopathy or radiculopathy, lumbar region: Secondary | ICD-10-CM

## 2022-08-27 MED ORDER — AMBULATORY NON FORMULARY MEDICATION: 0 refills | Status: DC

## 2022-08-27 NOTE — Progress Notes (Signed)
Left a detailed vm msg for patient regarding X-ray order. As well as an update on her request for a wheelchair. Direct call back info provided.

## 2022-08-27 NOTE — Progress Notes (Signed)
Pt is requesting a chest x-ray due to chest congestion. Concerned that it may have something to do with her dvt.   She is also requesting follow-up information on her wheelchair ordered on 08/19/22. I will follow-up.

## 2022-08-27 NOTE — Progress Notes (Signed)
Spoke to patient. Advised of xray order.   Patient provided a fax number to [employer] for wheelchair order:  [phone] (fax) [phone] (phone)  I was unable to find where [place] had sent the original order. Refaxed the order with insurance, office notes, and face sheet.

## 2022-08-27 NOTE — Progress Notes (Signed)
Orders Placed This Encounter  Procedures   DG Chest 2 View    Standing Status:   Future    Standing Expiration Date:   08/28/2023    Order Specific Question:   Reason for Exam (SYMPTOM  OR DIAGNOSIS REQUIRED)    Answer:   chest congestion    Order Specific Question:   Preferred imaging location?    Answer:   [facility]   She can go anytime. Wheelchair ordered 9/28

## 2022-08-30 NOTE — Progress Notes (Signed)
Lasharn, great news!  No sign of acute infection such as pneumonia etc.  She still having increased cough and sputum production then we could try the right for 5 days.  I know she does not tolerate prednisone well so we could do methylprednisolone instead.

## 2022-09-07 ENCOUNTER — Ambulatory Visit: Payer: Medicare Other | Admitting: Family Medicine

## 2022-09-07 ENCOUNTER — Ambulatory Visit (INDEPENDENT_AMBULATORY_CARE_PROVIDER_SITE_OTHER): Payer: Medicare Other | Admitting: Family Medicine

## 2022-09-07 VITALS — BP 135/79 | HR 77 | Ht 62.0 in | Wt 100.0 lb

## 2022-09-07 DIAGNOSIS — I471 Supraventricular tachycardia, unspecified: Secondary | ICD-10-CM | POA: Diagnosis not present

## 2022-09-07 DIAGNOSIS — Z23 Encounter for immunization: Secondary | ICD-10-CM

## 2022-09-07 DIAGNOSIS — I82432 Acute embolism and thrombosis of left popliteal vein: Secondary | ICD-10-CM

## 2022-09-07 NOTE — Assessment & Plan Note (Addendum)
Continue Eliquis.  Avoid all NSAIDs okay to use Tylenol if needed for pain control.   Treatment will be for 3 months so she should complete treatment around December 16.  We will plan to schedule a repeat Doppler around that time she is quite concerned about the DVT.  Once she is cleared and off the medication then she would benefit greatly from some  compression stockings to help with the residual edema after having had the DVT.  Discussed that today as well.

## 2022-09-07 NOTE — Assessment & Plan Note (Addendum)
We did discuss that if blood pressure drops low like it did over the weekend then she can split the metoprolol in half if needed.  She feels like she is hydrating well and does not feel like that is an active issue.

## 2022-09-07 NOTE — Progress Notes (Signed)
Established Patient Office Visit  Subjective   Patient ID: Stephanie Rollins, female    DOB: 01/20/1951  Age: 71 y.o. MRN: [id number]  Chief Complaint  Patient presents with   Follow-up    HPI  He is here today to follow-up on a couple of concerns.  She has noticed some lower blood pressures at home currently taking 25 mg of metoprolol for SVT in particular.  She said the other night she had a blood pressure with a systolic right around [id number].  Also wanted to follow-up on the acute DVT of the popliteal vein in the left lower extremity.  She is tolerating her Eliquis well but had a lot of concerns about bleeding.  She was not sure to get the flu shot today or not because of the bleeding risk.  She is taking 2 of the 2.5 mg dose because the 5 mg dose was getting hung in her throat.  Wanted to know when we would repeat the Doppler.  She wanted to know what type of pain reliever she could use over-the-counter if she needs 1.  Overall her cough is better as well.  Feels like the lower extremity swelling has improved somewhat.  It is always best in the morning and a little bit more at the end of the day but overall she does feel like it is better she has no sign varicose veins in her left leg.  She has hemorrhoids that flareup from time to time and is worried about the potential for bleeding especially being on the blood thinner.  Right now she is not having any difficulty with hard stools or constipation as she has been drinking Ensure and she says that actually keeps her going very regularly.  She has not been sleeping well lately.   US VENOUS LOWER EXTREMITY LEFT  Narrative:  US VENOUS LOWER EXTREMITY LEFT  INDICATION: Pain in Limb  COMPARISON: None Available at Time of Dictation  TECHNIQUE: The veins of the left lower extremity were interrogated from the visible common femoral vein to the distal popliteal vein. The junction of the greater saphenous with the common femoral vein as well  as the posterior tibial veins were evaluated. Gray scale, color, spectral, and doppler sonography was utilized and analyzed.  FINDINGS:  There is deep venous thrombus within the left popliteal and posterior tibial veins.  INCIDENTAL FINDINGS: None   Impression:  IMPRESSION: 1. There is deep venous thrombus within the left popliteal and posterior tibial veins.  ###CODE CRITICAL REPORT### (automated ordering provider notification pathway initiated at 08/07/2022 3:01 PM)  Electronically Signed by: Linden Dolin, MD on 08/07/2022 3:02 PM       ROS    Objective:     BP 135/79   Pulse 77   Ht 5\' 2"  (1.575 m)   Wt 100 lb (45.4 kg)   SpO2 99%   BMI 18.29 kg/m    Physical Exam   No results found for any visits on 09/07/22.    The 10-year ASCVD risk score (Arnett DK, et al., 2019) is: 14.5%    Assessment & Plan:   Problem List Items Addressed This Visit       Cardiovascular and Mediastinum   SVT (supraventricular tachycardia)    We did discuss that if blood pressure drops low like it did over the weekend then she can split the metoprolol in half if needed.  She feels like she is hydrating well and does not feel like that is an active  issue.       Acute deep vein thrombosis (DVT) of popliteal vein of left lower extremity (HCC) - Primary    Continue Eliquis.  Avoid all NSAIDs okay to use Tylenol if needed for pain control.   Treatment will be for 3 months so she should complete treatment around December 16.  We will plan to schedule a repeat Doppler around that time she is quite concerned about the DVT.  Once she is cleared and off the medication then she would benefit greatly from some  compression stockings to help with the residual edema after having had the DVT.  Discussed that today as well.      Relevant Orders   US Venous Img Lower Unilateral Left   Flu vaccine given today.  He does have a prior history of A-fib but had an ablation and had not had any  recurrences.  No follow-ups on file.   I spent 35 minutes on the day of the encounter to include pre-visit record review, face-to-face time with the patient and post visit ordering of test.   Beatrice Lecher, MD

## 2022-09-08 ENCOUNTER — Telehealth (HOSPITAL_BASED_OUTPATIENT_CLINIC_OR_DEPARTMENT_OTHER): Payer: Self-pay

## 2022-09-09 ENCOUNTER — Ambulatory Visit: Payer: Medicare Other | Admitting: Family Medicine

## 2022-10-11 ENCOUNTER — Telehealth: Payer: Self-pay | Admitting: General Practice

## 2022-10-11 NOTE — Telephone Encounter (Signed)
Left message for patient to schedule medicare wellness visit.  

## 2022-11-02 ENCOUNTER — Telehealth: Payer: Self-pay

## 2022-11-02 NOTE — Telephone Encounter (Signed)
I do not normally order imaging without an evaluation.  I want to make sure that we are getting the right imaging with the correct views and the only way that I can do that is to examine her.

## 2022-11-02 NOTE — Telephone Encounter (Addendum)
Patient states she has an appointment coming up on 11/04/22 to have Venous U/S done and would like to know if you can order X-ray on her left shoulder as well as her jaw. She states that her jaw feels dislocated when she chews due to Prednisone use. Her shoulder has been sore due to recent activities.

## 2022-11-03 NOTE — Telephone Encounter (Signed)
LVM requesting a return call.

## 2022-11-04 ENCOUNTER — Ambulatory Visit (INDEPENDENT_AMBULATORY_CARE_PROVIDER_SITE_OTHER): Payer: Medicare Other

## 2022-11-04 DIAGNOSIS — I82432 Acute embolism and thrombosis of left popliteal vein: Secondary | ICD-10-CM | POA: Diagnosis not present

## 2022-11-04 NOTE — Telephone Encounter (Signed)
Pt has an appointment on 12/18 will discuss with her then

## 2022-11-05 NOTE — Progress Notes (Signed)
Sharia, ultrasound shows no new DVT.  The old one is still present in the left popliteal vein.  So at this point it will likely be a chronic feature.  It is most important to stay active and make sure you are walking moving and doing stretching.  You are also overdue for your 10-year follow-up for colon cancer screening.  If you are not interested in doing a full colonoscopy would you consider doing the stool test.  The results are good for 3 years.  He also need to get you scheduled for your Medicare annual wellness exam with our Medicare wellness nurse,Bableen.  Can be done virtually or over the telephone what ever is most convenient for you.

## 2022-11-08 ENCOUNTER — Ambulatory Visit (INDEPENDENT_AMBULATORY_CARE_PROVIDER_SITE_OTHER): Payer: Medicare Other | Admitting: Family Medicine

## 2022-11-08 VITALS — BP 119/71 | HR 78 | Ht 59.0 in | Wt 93.0 lb

## 2022-11-08 DIAGNOSIS — M25512 Pain in left shoulder: Secondary | ICD-10-CM

## 2022-11-08 DIAGNOSIS — I82432 Acute embolism and thrombosis of left popliteal vein: Secondary | ICD-10-CM | POA: Diagnosis not present

## 2022-11-08 DIAGNOSIS — M26621 Arthralgia of right temporomandibular joint: Secondary | ICD-10-CM | POA: Diagnosis not present

## 2022-11-08 NOTE — Assessment & Plan Note (Signed)
Continue the Eliquis for 3 additional months for a total of 6 months treatment I will see her back in 3 months if she has any problems or concerns in the interim then please let me know.

## 2022-11-08 NOTE — Progress Notes (Signed)
Established Patient Office Visit  Subjective   Patient ID: Stephanie Rollins, female    DOB: [birth year]  Age: 71 y.o. MRN: [id number]  Chief Complaint  Patient presents with   Follow-up    HPI  She is here to follow-up on her lower extremity Dopplers.  She was still having some discomfort and pain in her left leg where she was had a known DVT.  She says she has been taking her Eliquis regularly so we got a follow-up ultrasound which showed that the old DVT was still there chronically but there was no acute problem.  She had also noticed a lump in her left inner groin area and they did Doppler that area as well and noted a benign-appearing, but enlarged lymph node.  He also brought in some different pairs of socks for me to look at to make sure that they were okay to wear.  The paperwork had encouraged wearing loosefitting socks.  She also mention that she has been having some pain and problems with her left shoulder.  She was reaching back to get her heat wrap and felt a "pop" and ever since then it has been sore to move.  She feels like it is gradually getting a little bit better.  She has not done any specific treatments for it.  She is able to reach without any difficulty or limitation in her range of motion.  Complains of right lateral jaw pain.  She does not currently have teeth so does not grind.  She says ever since she took prednisone it has been popping and clicking and occasionally painful.  He says she has been diagnosed with TMJ before.    ROS    Objective:     BP 119/71   Pulse 78   Ht 4\' 11"  (1.499 m)   Wt 93 lb (42.2 kg)   SpO2 98%   BMI 18.78 kg/m    Physical Exam Vitals reviewed.  Constitutional:      Appearance: She is well-developed.  HENT:     Head: Normocephalic and atraumatic.  Eyes:     Conjunctiva/sclera: Conjunctivae normal.  Cardiovascular:     Rate and Rhythm: Normal rate.  Pulmonary:     Effort: Pulmonary effort is normal.  Musculoskeletal:      Comments: Left shoulder with normal range of motion.  Normal external rotation.  Nontender over the shoulder joint itself.  Right jaw is nontender to palpation.  Skin:    General: Skin is dry.     Coloration: Skin is not pale.  Neurological:     Mental Status: She is alert and oriented to person, place, and time.  Psychiatric:        Behavior: Behavior normal.      No results found for any visits on 11/08/22.    The ASCVD Risk score (Arnett DK, et al., 2019) failed to calculate for the following reasons:   Cannot find a previous HDL lab   Cannot find a previous total cholesterol lab    Assessment & Plan:   Problem List Items Addressed This Visit       Cardiovascular and Mediastinum   Acute deep vein thrombosis (DVT) of popliteal vein of left lower extremity (HCC)    Continue the Eliquis for 3 additional months for a total of 6 months treatment I will see her back in 3 months if she has any problems or concerns in the interim then please let me know.  Other Visit Diagnoses     TMJ tenderness, right    -  Primary   Acute pain of left shoulder          Left shoulder pain-normal range of motion on exam today gave reassurance since it is feeling a little bit better.  If not improving over the next several weeks then please let me know.  Pain over the right lateral jaw-most consistent with TMJ.  Avoid excessive movement or chewing.  Additional handout with stretches given to take home and doing her own at home.   No follow-ups on file.    Nani Gasser, MD

## 2022-12-28 ENCOUNTER — Telehealth: Payer: Self-pay | Admitting: Family Medicine

## 2022-12-28 NOTE — Telephone Encounter (Signed)
Patient called to request for refills on Eliques 2.5mg  she says pharmacy needs a new order

## 2022-12-29 NOTE — Telephone Encounter (Signed)
When she finishes out her current prescription with all of his refills then she has done a full 7-month treatment. she does not need additional Eliquis.

## 2022-12-30 NOTE — Telephone Encounter (Signed)
Left detailed message advising of recommendations.  

## 2023-01-11 ENCOUNTER — Telehealth: Payer: Self-pay | Admitting: Family Medicine

## 2023-01-11 ENCOUNTER — Other Ambulatory Visit: Payer: Self-pay | Admitting: *Deleted

## 2023-01-11 DIAGNOSIS — I82432 Acute embolism and thrombosis of left popliteal vein: Secondary | ICD-10-CM

## 2023-01-11 MED ORDER — APIXABAN 2.5 MG PO TABS: 5.0000 mg | ORAL_TABLET | Freq: Two times a day (BID) | ORAL | 0 refills | Status: DC

## 2023-01-11 NOTE — Telephone Encounter (Signed)
Pt called this afternoon to verify her virtual appt. She stated she usually just has a telephone call, but we can no longer do that for her due to insurance. She stated that she has some questions to ask and also need a refill on Eliquis, which was the reason for the virtual. We have cancelled the virtual, but if someone could reach out to her.

## 2023-01-11 NOTE — Telephone Encounter (Signed)
Called pt and informed her that I sent over a 30 day supply of Eliquis.   Pt wanted to know if she would be getting another US done when she comes in for her f/u in March.  Pt would like to have an US done to check both of her legs. She stated that she is having swelling in her R leg and asked if Dr. Madilyn Fireman would order these. I informed her that she would need to be seen in the office for this and that this could not be ordered before hand due to it not being necessary according to her last [place].   Pt then asked about still taking the 81 mg Aspirin and Eliquis together. I advised her that if this was something that she shouldn't be taking then either the ED doctor, Dr. Stanford Breed or Dr. Madilyn Fireman would have informed her of this months ago. I did let her know that I would fwd all of these concerns to Dr. Madilyn Fireman for her thoughts and get back to her. Pt voiced understanding and agreed.   She has a f/u appt with Dr. Madilyn Fireman on 3/19

## 2023-01-12 NOTE — Telephone Encounter (Signed)
We repeated her ultrasound into her we do not need to repeat it again the plan will be for her to complete a full 6 months so she should finish her medication in March.  30-day supply was sent to complete that.  I do not recommend any type of chest scan at this point.

## 2023-01-12 NOTE — Telephone Encounter (Signed)
LVM informing pt of recommendations and told her that we would f/u on 3/19 at her office visit

## 2023-01-13 ENCOUNTER — Telehealth: Payer: 59 | Admitting: Family Medicine

## 2023-01-28 ENCOUNTER — Other Ambulatory Visit: Payer: Self-pay | Admitting: Cardiology

## 2023-01-28 DIAGNOSIS — I480 Paroxysmal atrial fibrillation: Secondary | ICD-10-CM

## 2023-02-08 ENCOUNTER — Other Ambulatory Visit: Payer: Self-pay | Admitting: Family Medicine

## 2023-02-08 ENCOUNTER — Ambulatory Visit (INDEPENDENT_AMBULATORY_CARE_PROVIDER_SITE_OTHER): Payer: 59 | Admitting: Family Medicine

## 2023-02-08 ENCOUNTER — Encounter: Payer: Self-pay | Admitting: Family Medicine

## 2023-02-08 VITALS — BP 123/73 | HR 71 | Wt 92.1 lb

## 2023-02-08 DIAGNOSIS — M810 Age-related osteoporosis without current pathological fracture: Secondary | ICD-10-CM | POA: Diagnosis not present

## 2023-02-08 DIAGNOSIS — E43 Unspecified severe protein-calorie malnutrition: Secondary | ICD-10-CM | POA: Diagnosis not present

## 2023-02-08 DIAGNOSIS — R600 Localized edema: Secondary | ICD-10-CM

## 2023-02-08 DIAGNOSIS — I82432 Acute embolism and thrombosis of left popliteal vein: Secondary | ICD-10-CM

## 2023-02-08 DIAGNOSIS — R319 Hematuria, unspecified: Secondary | ICD-10-CM | POA: Diagnosis not present

## 2023-02-08 DIAGNOSIS — R300 Dysuria: Secondary | ICD-10-CM | POA: Diagnosis not present

## 2023-02-08 DIAGNOSIS — R8290 Unspecified abnormal findings in urine: Secondary | ICD-10-CM

## 2023-02-08 DIAGNOSIS — E559 Vitamin D deficiency, unspecified: Secondary | ICD-10-CM | POA: Diagnosis not present

## 2023-02-08 DIAGNOSIS — W57XXXS Bitten or stung by nonvenomous insect and other nonvenomous arthropods, sequela: Secondary | ICD-10-CM

## 2023-02-08 LAB — POCT URINALYSIS DIP (CLINITEK)
Bilirubin, UA: NEGATIVE
Glucose, UA: NEGATIVE mg/dL
Ketones, POC UA: NEGATIVE mg/dL
Leukocytes, UA: NEGATIVE
Nitrite, UA: NEGATIVE
POC PROTEIN,UA: NEGATIVE
Spec Grav, UA: >=1.03 — AB (ref 1.010–1.025)
Urobilinogen, UA: 0.2 E.U./dL
pH, UA: 5.5 (ref 5.0–8.0)

## 2023-02-08 MED ORDER — AMBULATORY NON FORMULARY MEDICATION: 0 refills | Status: AC

## 2023-02-08 NOTE — Assessment & Plan Note (Addendum)
Plan to check vitamin D levels. Due for DEXA, last one in 2021.

## 2023-02-08 NOTE — Progress Notes (Signed)
Established Patient Office Visit  Subjective   Patient ID: Stephanie Rollins, female    DOB: 10/04/1951  Age: 72 y.o. MRN: [id number]  Chief Complaint  Patient presents with   Follow-up         HPI  Here for follow-up of lower extremity DVT.  She is been taking her Eliquis regularly.  He has now reached a total of 6 months treatment.  She did have a 37-month follow-up with venous Doppler in December showing a near occlusion DVT involving the distal aspect of the left popliteal vein extending into the left tibioperoneal trunk.  She also had a tick bite back in 2017 and then developed a rash afterwards.  She stayed away from beef ever since.  She would like to start eating beef again particularly ground beef and so would like to have testing done to see if this will be okay.  She has had bilateral lower extremity swelling she has noticed it more since having the blood clot but then also reported she had noticed it even before having the blood clot that she had had some bilateral swelling.  For about 5 days she has had some dysuria and change in urine odor.  No fevers or chills.  She has urinary frequency that is not new.    ROS    Objective:     BP 123/73   Pulse 71   Wt 92 lb 1.3 oz (41.8 kg)   SpO2 99%   BMI 18.60 kg/m    Physical Exam   Results for orders placed or performed in visit on 02/08/23  POCT URINALYSIS DIP (CLINITEK)  Result Value Ref Range   Color, UA yellow yellow   Clarity, UA clear clear   Glucose, UA negative negative mg/dL   Bilirubin, UA negative negative   Ketones, POC UA negative negative mg/dL   Spec Grav, UA >=1.030 (A) 1.010 - 1.025   Blood, UA moderate (A) negative   pH, UA 5.5 5.0 - 8.0   POC PROTEIN,UA negative negative, trace   Urobilinogen, UA 0.2 0.2 or 1.0 E.U./dL   Nitrite, UA Negative Negative   Leukocytes, UA Negative Negative      The ASCVD Risk score (Arnett DK, et al., 2019) failed to calculate for the following  reasons:   Cannot find a previous HDL lab   Cannot find a previous total cholesterol lab    Assessment & Plan:   Problem List Items Addressed This Visit       Cardiovascular and Mediastinum   Acute deep vein thrombosis (DVT) of popliteal vein of left lower extremity (HCC)    OK to go ahead and discontinue the Eliquis.  No taper required.  Will go ahead and do a hypercoagulable panel today.  No sign of any new DVT though she was worried about this.  If she develops any new symptoms off of the medication please let us know.      Relevant Orders   Hypercoagulable panel, comprehensive     Musculoskeletal and Integument   Osteoporosis    Plan to check vitamin D levels. Due for DEXA, last one in 2021.        Relevant Orders   DG Bone Density     Other   Severe protein-calorie malnutrition ([place])    She has lost some weight which could be contributing to her lower blood pressures.  But she has been fairly stable over the last 3 months which is good.  Will continue  to monitor.      Other Visit Diagnoses     Dysuria    -  Primary   Relevant Orders   POCT URINALYSIS DIP (CLINITEK) (Completed)   CBC   TSH   Urine Culture   Urinalysis, microscopic only   Comprehensive Metabolic Panel (CMET)   Abnormal urine odor       Relevant Orders   POCT URINALYSIS DIP (CLINITEK) (Completed)   Vitamin D deficiency       Relevant Orders   VITAMIN D 25 Hydroxy (Vit-D Deficiency, Fractures)   Bilateral lower extremity edema       Relevant Medications   AMBULATORY NON FORMULARY MEDICATION   Other Relevant Orders   CBC   TSH   Comprehensive Metabolic Panel (CMET)   Tick bite, unspecified site, sequela       Relevant Orders   Alpha-Gal Panel   Hematuria, unspecified type       Relevant Orders   Urine Culture   Urinalysis, microscopic only       Dysuria-negative for infection but does show some blood so we will send her urine for microscopic review and a urine culture.  Tick bite  several years ago-will check for elevated alpha gal levels if normal then okay to proceed with eating beef.  If abnormal we will continue to abstain.  Bilateral lower extremity edema-there is some trace pitting around the medial malleoli bilaterally.  It sounds like it has been going on since maybe even before the blood clots.  So I am going to do some additional labs since it has been a while since she has had blood work just to check renal function, check for thyroid etc.  No follow-ups on file.   I spent 45 minutes on the day of the encounter to include pre-visit record review, face-to-face time with the patient and post visit ordering of test.   Beatrice Lecher, MD

## 2023-02-08 NOTE — Assessment & Plan Note (Signed)
She has lost some weight which could be contributing to her lower blood pressures.  But she has been fairly stable over the last 3 months which is good.  Will continue to monitor.

## 2023-02-08 NOTE — Assessment & Plan Note (Signed)
OK to go ahead and discontinue the Eliquis.  No taper required.  Will go ahead and do a hypercoagulable panel today.  No sign of any new DVT though she was worried about this.  If she develops any new symptoms off of the medication please let us know.

## 2023-02-09 LAB — URINALYSIS, MICROSCOPIC ONLY
Bacteria, UA: NONE SEEN /HPF
Hyaline Cast: NONE SEEN /LPF
Squamous Epithelial / HPF: NONE SEEN /HPF (ref <=5)
WBC, UA: NONE SEEN /HPF (ref 0–5)

## 2023-02-09 LAB — URINE CULTURE
MICRO NUMBER:: 14711784
Result:: NO GROWTH
SPECIMEN QUALITY:: ADEQUATE

## 2023-02-09 NOTE — Progress Notes (Signed)
Carena, your platelets are low similar to about 3 years ago in 2020.  So I do want a keep an eye on that and plan to recheck your platelets again in 1 month.  Your thyroid is normal.  Vitamin D is on the lower end of normal but it is technically in the normal range.  I would recommend taking 25 mcg daily of vitamin D to help improve bone strength.  Additional test pending.

## 2023-02-10 NOTE — Progress Notes (Signed)
Hi Stephanie Rollins, culture is negative so no sign of infection.

## 2023-02-12 LAB — COMPREHENSIVE METABOLIC PANEL
ALT: 17 IU/L (ref 0–32)
AST: 21 IU/L (ref 0–40)
Albumin/Globulin Ratio: 1.8 (ref 1.2–2.2)
Albumin: 4.2 g/dL (ref 3.8–4.8)
Alkaline Phosphatase: 97 IU/L (ref 44–121)
BUN/Creatinine Ratio: 16 (ref 12–28)
BUN: 10 mg/dL (ref 8–27)
Bilirubin Total: 0.5 mg/dL (ref 0.0–1.2)
CO2: 25 mmol/L (ref 20–29)
Calcium: 9.5 mg/dL (ref 8.7–10.3)
Chloride: 103 mmol/L (ref 96–106)
Creatinine, Ser: 0.64 mg/dL (ref 0.57–1.00)
Globulin, Total: 2.4 g/dL (ref 1.5–4.5)
Glucose: 105 mg/dL — ABNORMAL HIGH (ref 70–99)
Potassium: 3.8 mmol/L (ref 3.5–5.2)
Sodium: 141 mmol/L (ref 134–144)
Total Protein: 6.6 g/dL (ref 6.0–8.5)
eGFR: 93 mL/min/{1.73_m2} (ref >59)

## 2023-02-12 LAB — ALPHA-GAL PANEL
Allergen Lamb IgE: <0.1 kU/L
Beef IgE: <0.1 kU/L
IgE (Immunoglobulin E), Serum: 3 IU/mL — ABNORMAL LOW (ref 6–495)
O215-IgE Alpha-Gal: <0.1 kU/L
Pork IgE: <0.1 kU/L

## 2023-02-15 NOTE — Progress Notes (Signed)
Hi Stephanie Rollins, great news!  Your metabolic panel looks good.  And there is no sign of allergy to beef pork or lamb.  No significantly elevated levels for alpha gal.

## 2023-02-17 ENCOUNTER — Telehealth: Payer: Self-pay | Admitting: *Deleted

## 2023-02-17 DIAGNOSIS — R600 Localized edema: Secondary | ICD-10-CM

## 2023-02-17 DIAGNOSIS — I82432 Acute embolism and thrombosis of left popliteal vein: Secondary | ICD-10-CM

## 2023-02-17 NOTE — Telephone Encounter (Signed)
Pt called and wanted to know about stopping the Eliquis. She stated that because she has A-fib. She said that this came from something that she had read   She would like to get a referral to a Vascular doctor at [facility] (Dr. Rebecca Eaton Phone# [phone]) because of the swelling in her feet and legs and to find out more to prevent her from having anymore blood clots. She mentioned that there were more areas in her L leg.    Will fwd to pcp

## 2023-02-17 NOTE — Telephone Encounter (Signed)
Orders Placed This Encounter  Procedures   Ambulatory referral to Vascular Surgery    Referral Priority:   Routine    Referral Type:   Surgical    Referral Reason:   Specialty Services Required    Referred to Provider:   Dorette Grate    Requested Specialty:   Vascular Surgery    Number of Visits Requested:   1   Ambulatory referral to Cardiology    Referral Priority:   Routine    Referral Type:   Consultation    Referral Reason:   Specialty Services Required    Number of Visits Requested:   1

## 2023-02-21 ENCOUNTER — Other Ambulatory Visit: Payer: Self-pay | Admitting: Cardiology

## 2023-02-21 DIAGNOSIS — I480 Paroxysmal atrial fibrillation: Secondary | ICD-10-CM

## 2023-02-23 NOTE — Telephone Encounter (Signed)
Lvm advising pt of referral and that should she have additional questions about stopping the Eliquis she should contact the Cardiologist.

## 2023-02-24 ENCOUNTER — Telehealth: Payer: Self-pay | Admitting: Cardiology

## 2023-02-24 NOTE — Telephone Encounter (Signed)
Pt informed of providers result & recommendations. Pt verbalized understanding. She will call PCP and discuss.

## 2023-02-24 NOTE — Telephone Encounter (Signed)
Patient wanted to update Dr. Stanford Breed that she did 7-day heart monitor last year in August and she was diagnosed in the ER on 08/07/22 with a blood clot.   Patient stated her PCP wants to stop her Eliquis and she has been doing good with this medication.  Patient is concerned this could cause her SVT to come back.  Patient stated she will be running out of this medication in 2 days and is concerned she may need a refill.

## 2023-02-25 LAB — HYPERCOAGULABLE PANEL, COMPREHENSIVE
APTT: 26.1 s
AT III Act/Nor PPP Chro: 127 %
Act. Prt C Resist w/FV Defic.: 1.7 ratio — ABNORMAL LOW
Anticardiolipin Ab, IgG: <10 [GPL'U]
Anticardiolipin Ab, IgM: <10 [MPL'U]
Beta-2 Glycoprotein I, IgA: <10 SAU
Beta-2 Glycoprotein I, IgG: <10 SGU
Beta-2 Glycoprotein I, IgM: <10 SMU
DRVVT Screen Seconds: 36.9 s
Factor VII Antigen**: 58 % — ABNORMAL LOW
Factor VIII Activity: 95 %
Hexagonal Phospholipid Neutral: 2 s
Homocysteine: 6.3 umol/L
Prot C Ag Act/Nor PPP Imm: 77 %
Prot S Ag Act/Nor PPP Imm: 79 %
Protein C Ag/FVII Ag Ratio**: 1.3 ratio
Protein S Ag/FVII Ag Ratio**: 1.4 ratio

## 2023-02-25 LAB — CBC
Hematocrit: 38.3 % (ref 34.0–46.6)
Hemoglobin: 12.2 g/dL (ref 11.1–15.9)
MCH: 29.8 pg (ref 26.6–33.0)
MCHC: 31.9 g/dL (ref 31.5–35.7)
MCV: 94 fL (ref 79–97)
Platelets: 123 10*3/uL — ABNORMAL LOW (ref 150–450)
RBC: 4.09 x10E6/uL (ref 3.77–5.28)
RDW: 12.9 % (ref 11.7–15.4)
WBC: 7.2 10*3/uL (ref 3.4–10.8)

## 2023-02-25 LAB — FACTOR V LEIDEN

## 2023-02-25 LAB — VITAMIN D 25 HYDROXY (VIT D DEFICIENCY, FRACTURES): Vit D, 25-Hydroxy: 32.8 ng/mL (ref 30.0–100.0)

## 2023-02-25 LAB — TSH: TSH: 2.08 u[IU]/mL (ref 0.450–4.500)

## 2023-02-25 NOTE — Progress Notes (Signed)
HPI: FU SVT as well as atrial fibrillation and mitral valve prolapse. Cardiac catheterization in September of 2002 showed normal coronary arteries and normal LV function with an ejection fraction of 65%. Myoview in 2007 showed EF 70 and normal perfusion. She has had a previous ablation of her atrial fibrillation in August 2007 [facility]. She has also had recurrent palpitations secondary to PVCs treated with beta blockade. Event monitor 12/14 showed sinus with brief PAT. Monitor January 2020 showed sinus rhythm with PACs, PVCs, brief PAT and 4/7 beat runs of nonsustained ventricular tachycardia. Echocardiogram March 2020 showed normal LV function.  Monitor August 2023 showed normal sinus rhythm with occasional PACs and PVCs and short runs of SVT.  Diagnosed with acute DVT September 2023.  Lower extremity venous Dopplers December 2023 showed no acute DVT in the left lower extremity; positive for near occlusive DVT involving the distal aspect of the left popliteal vein extending to involve the left tibioperoneal trunk.  She is being treated with 6 months of anticoagulation per primary care.  Since I last saw her patient denies dyspnea, exertional chest pain or syncope.  Occasional palpitations.  Current Outpatient Medications  Medication Sig Dispense Refill   AMBULATORY NON FORMULARY MEDICATION 4-prong cane dx ambulatory dysfunction 1 Units prn   AMBULATORY NON FORMULARY MEDICATION Mechanical soft diet DX: R13.10, R63.4 1 Units 0   AMBULATORY NON FORMULARY MEDICATION Medication Name: Compression stockings 15-20 mmHg 1 each 0   aspirin 81 MG chewable tablet Chew 81 mg by mouth daily.      CALCIUM-VITAMIN D PO Take by mouth.     metoprolol tartrate (LOPRESSOR) 25 MG tablet TAKE 1 TABLET (25 MG TOTAL) BY MOUTH 2 (TWO) TIMES DAILY. KEEP OV. 180 tablet 1   No current facility-administered medications for this visit.     Past Medical History:  Diagnosis Date   Anal fistula    Atrial  fibrillation    Dyslipidemia    Hearing loss    75%   Hypertension    IBS (irritable bowel syndrome)    remote history   Interstitial cystitis    Kidney stone    Menopausal state    MVP (mitral valve prolapse)    Paroxysmal A-fib    status post ablation   Swallowing difficulty    problems for second/need dentures    Past Surgical History:  Procedure Laterality Date   CARDIAC ELECTROPHYSIOLOGY MAPPING AND ABLATION     for A fib   CESAREAN SECTION  1971, 1973   x 2   NASAL SEPTUM SURGERY  1977   pulmonary vein isolation  06-2006   procedure for AFIB /[facility]    Social History   Socioeconomic History   Marital status: Divorced    Spouse name: Not on file   Number of children: 2   Years of education: Not on file   Highest education level: Not on file  Occupational History   Occupation: retired  Tobacco Use   Smoking status: Never   Smokeless tobacco: Never  Vaping Use   Vaping Use: Never used  Substance and Sexual Activity   Alcohol use: No   Drug use: No   Sexual activity: Not on file    Comment: unemployed, GED, divorced,2 adult children, no caffeine, no regular exercise  Other Topics Concern   Not on file  Social History Narrative   Not on file   Social Determinants of Health   Financial Resource Strain: Not on file  Food Insecurity: Not on file  Transportation Needs: Not on file  Physical Activity: Not on file  Stress: Not on file  Social Connections: Not on file  Intimate Partner Violence: Not on file    Family History  Problem Relation Age of Onset   Hypertension Mother    Diabetes Maternal Grandmother    Heart disease Brother    Mastocytosis Brother    Stroke Paternal Aunt    Heart attack Father    Heart disease Sister        x 2   Colon cancer Neg Hx    Esophageal cancer Neg Hx     ROS: no fevers or chills, productive cough, hemoptysis, dysphasia, odynophagia, melena, hematochezia, dysuria, hematuria, rash, seizure activity,  orthopnea, PND, pedal edema, claudication. Remaining systems are negative.  Physical Exam: Well-developed well-nourished in no acute distress.  Skin is warm and dry.  HEENT is normal.  Neck is supple.  Chest is clear to auscultation with normal expansion.  Cardiovascular exam is irregular Abdominal exam nontender or distended. No masses palpated. Extremities show no edema. neuro grossly intact  ECG- personally reviewed  A/P  1 history of paroxysmal atrial fibrillation-patient is status post ablation with no documented recurrences.  2 history of recurrent palpitations-previous monitors labs showed PACs, PVCs and brief runs of SVT.  Continue beta-blocker.  3 recent DVT-managed by primary care; apixaban recently discontinued.  4 history of chest pain-no recent symptoms.  5 question history of mitral valve prolapse-this was not evident on her most recent echocardiogram.  Olga MillersBrian Tiger Spieker, MD

## 2023-03-02 NOTE — Progress Notes (Signed)
Hi Stephanie Rollins, your anticoagulation panel showed that you are heterozygous for something called factor V Leiden mutation.  Is a risk factor for clotting but this mutation by itself does not warrant long time anticoagulant treatment/therapy.  Your factor VII antigen level was a little on the low side as well.  We could work this up further with factor VII activity lab result.  Otherwise everything else was negative and was very reassuring that we do not need to continue with long-term anticoagulation therapy.

## 2023-03-07 ENCOUNTER — Ambulatory Visit (INDEPENDENT_AMBULATORY_CARE_PROVIDER_SITE_OTHER): Payer: 59 | Admitting: Cardiology

## 2023-03-07 ENCOUNTER — Encounter: Payer: Self-pay | Admitting: Cardiology

## 2023-03-07 VITALS — BP 130/86 | HR 95 | Ht 61.0 in | Wt 94.0 lb

## 2023-03-07 DIAGNOSIS — I480 Paroxysmal atrial fibrillation: Secondary | ICD-10-CM

## 2023-03-07 DIAGNOSIS — R002 Palpitations: Secondary | ICD-10-CM

## 2023-03-07 DIAGNOSIS — I341 Nonrheumatic mitral (valve) prolapse: Secondary | ICD-10-CM | POA: Diagnosis not present

## 2023-03-07 NOTE — Patient Instructions (Signed)
    Follow-Up: At [facility], you and your health needs are our priority.  As part of our continuing mission to provide you with exceptional heart care, we have created designated Provider Care Teams.  These Care Teams include your primary Cardiologist (physician) and Advanced Practice Providers (APPs -  Physician Assistants and Nurse Practitioners) who all work together to provide you with the care you need, when you need it.  We recommend signing up for the patient portal called "MyChart".  Sign up information is provided on this After Visit Summary.  MyChart is used to connect with patients for Virtual Visits (Telemedicine).  Patients are able to view lab/test results, encounter notes, upcoming appointments, etc.  Non-urgent messages can be sent to your provider as well.   To learn more about what you can do with MyChart, go to [email or web address].    Your next appointment:   12 month(s)  Provider:   Brian Crenshaw, MD     

## 2023-03-16 ENCOUNTER — Telehealth: Payer: Self-pay | Admitting: Family Medicine

## 2023-03-16 NOTE — Telephone Encounter (Signed)
Called patient to schedule Medicare Annual Wellness Visit (AWV). Left message for patient to call back and schedule Medicare Annual Wellness Visit (AWV).  Last date of AWV: 2018  Please schedule an appointment at any time with [facility].  If any questions, please contact me at [phone].  Thank you ,  Morgan Jessup Patient Access Advocate II Direct Dial: [phone]   

## 2023-04-26 DIAGNOSIS — Z86718 Personal history of other venous thrombosis and embolism: Secondary | ICD-10-CM | POA: Diagnosis not present

## 2023-04-26 DIAGNOSIS — M7989 Other specified soft tissue disorders: Secondary | ICD-10-CM | POA: Diagnosis not present

## 2023-08-18 ENCOUNTER — Telehealth: Payer: Self-pay | Admitting: Family Medicine

## 2023-08-18 NOTE — Telephone Encounter (Signed)
Pt called. She states pcp knows about her back issues and she would like to come to get a xray( patient needed a 4:00 appointment and I could not find an appointment soon enough for her to come in to see pcp).

## 2023-08-19 NOTE — Telephone Encounter (Signed)
You may have to offer her something with another provider unfortunately the 4:00 slot is just very popular and so it has been pretty booked out.  Be to put her in acute next week but unfortunately I do not think there is a 4:00.

## 2023-08-29 ENCOUNTER — Ambulatory Visit: Payer: 59

## 2023-10-30 DIAGNOSIS — Z888 Allergy status to other drugs, medicaments and biological substances status: Secondary | ICD-10-CM | POA: Diagnosis not present

## 2023-10-30 DIAGNOSIS — Z7982 Long term (current) use of aspirin: Secondary | ICD-10-CM | POA: Diagnosis not present

## 2023-10-30 DIAGNOSIS — E278 Other specified disorders of adrenal gland: Secondary | ICD-10-CM | POA: Diagnosis not present

## 2023-10-30 DIAGNOSIS — Z8679 Personal history of other diseases of the circulatory system: Secondary | ICD-10-CM | POA: Diagnosis not present

## 2023-10-30 DIAGNOSIS — K8020 Calculus of gallbladder without cholecystitis without obstruction: Secondary | ICD-10-CM | POA: Diagnosis not present

## 2023-10-30 DIAGNOSIS — K529 Noninfective gastroenteritis and colitis, unspecified: Secondary | ICD-10-CM | POA: Diagnosis not present

## 2023-10-30 DIAGNOSIS — Z7901 Long term (current) use of anticoagulants: Secondary | ICD-10-CM | POA: Diagnosis not present

## 2023-10-30 DIAGNOSIS — Z91013 Allergy to seafood: Secondary | ICD-10-CM | POA: Diagnosis not present

## 2023-10-30 DIAGNOSIS — Z880 Allergy status to penicillin: Secondary | ICD-10-CM | POA: Diagnosis not present

## 2023-10-30 DIAGNOSIS — Z881 Allergy status to other antibiotic agents status: Secondary | ICD-10-CM | POA: Diagnosis not present

## 2023-10-30 DIAGNOSIS — Z79899 Other long term (current) drug therapy: Secondary | ICD-10-CM | POA: Diagnosis not present

## 2023-10-30 DIAGNOSIS — Z885 Allergy status to narcotic agent status: Secondary | ICD-10-CM | POA: Diagnosis not present

## 2023-11-04 ENCOUNTER — Telehealth: Payer: Self-pay

## 2023-11-04 ENCOUNTER — Ambulatory Visit: Payer: Self-pay | Admitting: Family Medicine

## 2023-11-04 NOTE — Telephone Encounter (Signed)
Agree with below. Addendum:    I believe that she has limits with moving and including, toileting, bathing, feeding, dressing and grooming. I believe the power wheelchair is needed for pt to be able to perform ADL's in her home

## 2023-11-04 NOTE — Telephone Encounter (Signed)
FYI Received a call from [place] with [facility] Regarding patient States pt was seen in ER on Sunday 11/09/2023 for abdominal pain and weakness- CT scan was done  Was sent home  Patient still having abdominal pain and increased weakness  Was wanting to know if Dr. Linford Arnold had direct admit privileges to the hospital so she would not have to wait in the ER again. Informed that Dr. Linford Arnold does not have direct admit privileges. She should go to ER for  increase of symptoms. [place] will inform patient of recommendations.

## 2023-11-04 NOTE — Telephone Encounter (Signed)
Copied from CRM [id number]. Topic: Clinical - Pink Word Triage >> Nov 04, 2023  3:52 PM Gaetano Hawthorne wrote: Reason for Triage: Patient went to ER on Sunday because she couldn't eat or drink anything, dehryation and felt weakness - condition is not improving but they wouldn't admit her at that time. Patient reports being more weak and having trouble with walking - Patient would like some assistance with next steps.   Chief Complaint: abdominal pain Symptoms: weakness due to not eating or drinking  Frequency: ongoing for 1 week Pertinent Negatives: Patient denies fever Disposition: [x] ED /[] Urgent Care (no appt availability in office) / [] Appointment(In office/virtual)/ []  [facility]/ [] Home Care/ [] Refused Recommended Disposition /[] [place] [facility]/ []  Follow-up with PCP Additional Notes: The patient got up this morning and was much weaker than she has been as she has been unable to eat and only drink up to 15 oz of water daily due to abdominal discomfort.  She went to ED Sunday as she was weak and didn't have an appetite and wasn't drinking much.  She reported feeling weak and dehydrated.  She wants to be admitted to the hospital because she is afraid that she is going to fall due to her weakness.  Her last bowel movement was last Tuesday and she had a small bowel movement like coffee beans yesterday but she believes she is constipated. Per the patient the ED said osteoporis is causing her colon to swell and causing her not about to have a bowel movement.  She reported that she normally goes to the bathroom up to 14 times a day due to bladder problem and she is concerned about falling because she is home alone for up to 10 hours a day. Called the clinic access line and spoke to Selena Batten who confirmed that Dr. Linford Arnold does not have the privileges to directly admit the patient to the hospital.  The patient was hesitant to go to the ED again due to being sent home Sunday.    Reason for  Disposition  [1] MILD-MODERATE pain AND [2] constant AND [3] present > 2 hours  Answer Assessment - Initial Assessment Questions 1. LOCATION: "Where does it hurt?"      Caller had difficulty hearing and did not answer question.  States that the stomach pain is causing her not to eat.  She thinks it is due to constipation.  ED would not admit her. 3. ONSET: "When did the pain begin?" (e.g., minutes, hours or days ago)      Last Tuesday 6. SEVERITY: "How bad is the pain?"  (e.g., Scale 1-10; mild, moderate, or severe)    - MILD (1-3): Doesn't interfere with normal activities, abdomen soft and not tender to touch.     - MODERATE (4-7): Interferes with normal activities or awakens from sleep, abdomen tender to touch.     - SEVERE (8-10): Excruciating pain, doubled over, unable to do any normal activities.   Tight pressure  - unable to eat or drink     8. CAUSE: "What do you think is causing the stomach pain?"     Constipation  9. RELIEVING/AGGRAVATING FACTORS: "What makes it better or worse?" (e.g., antacids, bending or twisting motion, bowel movement)     Eating  10. OTHER SYMPTOMS: "Do you have any other symptoms?" (e.g., back pain, diarrhea, fever, urination pain, vomiting)       weakness  Protocols used: Abdominal Pain - Female-A-AH

## 2023-11-05 DIAGNOSIS — I491 Atrial premature depolarization: Secondary | ICD-10-CM | POA: Diagnosis not present

## 2023-11-05 DIAGNOSIS — Z8679 Personal history of other diseases of the circulatory system: Secondary | ICD-10-CM | POA: Diagnosis not present

## 2023-11-05 DIAGNOSIS — R9431 Abnormal electrocardiogram [ECG] [EKG]: Secondary | ICD-10-CM | POA: Diagnosis not present

## 2023-11-05 DIAGNOSIS — I498 Other specified cardiac arrhythmias: Secondary | ICD-10-CM | POA: Diagnosis not present

## 2023-11-05 DIAGNOSIS — Z7901 Long term (current) use of anticoagulants: Secondary | ICD-10-CM | POA: Diagnosis not present

## 2023-11-05 DIAGNOSIS — Z7982 Long term (current) use of aspirin: Secondary | ICD-10-CM | POA: Diagnosis not present

## 2023-11-05 DIAGNOSIS — Z79899 Other long term (current) drug therapy: Secondary | ICD-10-CM | POA: Diagnosis not present

## 2023-11-05 DIAGNOSIS — E860 Dehydration: Secondary | ICD-10-CM | POA: Diagnosis not present

## 2023-11-05 DIAGNOSIS — E878 Other disorders of electrolyte and fluid balance, not elsewhere classified: Secondary | ICD-10-CM | POA: Diagnosis not present

## 2023-11-05 DIAGNOSIS — Z681 Body mass index (BMI) 19 or less, adult: Secondary | ICD-10-CM | POA: Diagnosis not present

## 2023-11-05 DIAGNOSIS — K5900 Constipation, unspecified: Secondary | ICD-10-CM | POA: Diagnosis not present

## 2023-11-05 DIAGNOSIS — E876 Hypokalemia: Secondary | ICD-10-CM | POA: Diagnosis not present

## 2023-12-09 ENCOUNTER — Telehealth: Payer: Self-pay

## 2023-12-09 DIAGNOSIS — E43 Unspecified severe protein-calorie malnutrition: Secondary | ICD-10-CM

## 2023-12-09 NOTE — Telephone Encounter (Signed)
Copied from CRM [id number]. Topic: Clinical - Medical Advice >> Dec 08, 2023  4:37 PM Elle L wrote: Reason for CRM: The patient is wanting assistance in getting into a nursing home but is unable to come in for an appointment. Her call back number is [phone].

## 2023-12-12 NOTE — Telephone Encounter (Signed)
Can send a referral for social work.  Orders Placed This Encounter  Procedures   AMB Referral VBCI Care Management    Referral Priority:   Routine    Referral Type:   Consultation    Referral Reason:   Care Coordination    Number of Visits Requested:   1

## 2023-12-12 NOTE — Telephone Encounter (Signed)
Left message for a return call

## 2023-12-12 NOTE — Addendum Note (Signed)
Addended by: Nani Gasser D on: 12/12/2023 03:19 PM   Modules accepted: Orders

## 2023-12-13 ENCOUNTER — Telehealth: Payer: Self-pay | Admitting: *Deleted

## 2023-12-13 NOTE — Progress Notes (Unsigned)
Complex Care Management Note Care Guide Note  12/13/2023 Name: Stephanie Rollins MRN: [id number] DOB: Mar 26, 1951   Complex Care Management Outreach Attempts: An unsuccessful telephone outreach was attempted today to offer the patient information about available complex care management services.  Follow Up Plan:  Additional outreach attempts will be made to offer the patient complex care management information and services.   Encounter Outcome:  No Answer  Burman Nieves, CMA, Care Guide [facility]  [facility], [facility] Guide Direct Dial: [phone]  Fax: [phone] Website: [email or web address]

## 2023-12-14 NOTE — Progress Notes (Signed)
Complex Care Management Note Care Guide Note  12/14/2023 Name: Stephanie Rollins MRN: [id number] DOB: Nov 12, 1951   Complex Care Management Outreach Attempts: A second unsuccessful outreach was attempted today to offer the patient with information about available complex care management services.  Follow Up Plan:  Additional outreach attempts will be made to offer the patient complex care management information and services.   Encounter Outcome:  No Answer  Burman Nieves, CMA, Care Guide [facility]  [facility], [facility] Guide Direct Dial: [phone]  Fax: [phone] Website: [email or web address]

## 2023-12-15 NOTE — Progress Notes (Signed)
Complex Care Management Note Care Guide Note  12/15/2023 Name: Stephanie Rollins MRN: [id number] DOB: 30-Jan-1951   Complex Care Management Outreach Attempts: A third unsuccessful outreach was attempted today to offer the patient with information about available complex care management services.  Follow Up Plan:  No further outreach attempts will be made at this time. We have been unable to contact the patient to offer or enroll patient in complex care management services.  Encounter Outcome:  No Answer  Burman Nieves, CMA, Care Guide [facility]  [facility], [facility] Guide Direct Dial: [phone]  Fax: [phone] Website: [email or web address]

## 2024-01-07 DIAGNOSIS — M8008XA Age-related osteoporosis with current pathological fracture, vertebra(e), initial encounter for fracture: Secondary | ICD-10-CM | POA: Diagnosis not present

## 2024-01-07 DIAGNOSIS — K8020 Calculus of gallbladder without cholecystitis without obstruction: Secondary | ICD-10-CM | POA: Diagnosis not present

## 2024-01-07 DIAGNOSIS — R600 Localized edema: Secondary | ICD-10-CM | POA: Diagnosis not present

## 2024-01-07 DIAGNOSIS — G894 Chronic pain syndrome: Secondary | ICD-10-CM | POA: Diagnosis not present

## 2024-01-07 DIAGNOSIS — I4892 Unspecified atrial flutter: Secondary | ICD-10-CM | POA: Diagnosis not present

## 2024-01-07 DIAGNOSIS — K819 Cholecystitis, unspecified: Secondary | ICD-10-CM | POA: Diagnosis not present

## 2024-01-07 DIAGNOSIS — I4891 Unspecified atrial fibrillation: Secondary | ICD-10-CM | POA: Diagnosis not present

## 2024-01-07 DIAGNOSIS — R64 Cachexia: Secondary | ICD-10-CM | POA: Diagnosis not present

## 2024-01-07 DIAGNOSIS — Z439 Encounter for attention to unspecified artificial opening: Secondary | ICD-10-CM | POA: Diagnosis not present

## 2024-01-07 DIAGNOSIS — Z7189 Other specified counseling: Secondary | ICD-10-CM | POA: Diagnosis not present

## 2024-01-07 DIAGNOSIS — I4719 Other supraventricular tachycardia: Secondary | ICD-10-CM | POA: Diagnosis not present

## 2024-01-07 DIAGNOSIS — E871 Hypo-osmolality and hyponatremia: Secondary | ICD-10-CM | POA: Diagnosis not present

## 2024-01-07 DIAGNOSIS — R0600 Dyspnea, unspecified: Secondary | ICD-10-CM | POA: Diagnosis not present

## 2024-01-07 DIAGNOSIS — I482 Chronic atrial fibrillation, unspecified: Secondary | ICD-10-CM | POA: Diagnosis not present

## 2024-01-07 DIAGNOSIS — M6281 Muscle weakness (generalized): Secondary | ICD-10-CM | POA: Diagnosis not present

## 2024-01-07 DIAGNOSIS — Z79899 Other long term (current) drug therapy: Secondary | ICD-10-CM | POA: Diagnosis not present

## 2024-01-07 DIAGNOSIS — I82403 Acute embolism and thrombosis of unspecified deep veins of lower extremity, bilateral: Secondary | ICD-10-CM | POA: Diagnosis not present

## 2024-01-07 DIAGNOSIS — Z86718 Personal history of other venous thrombosis and embolism: Secondary | ICD-10-CM | POA: Diagnosis not present

## 2024-01-07 DIAGNOSIS — M7989 Other specified soft tissue disorders: Secondary | ICD-10-CM | POA: Diagnosis not present

## 2024-01-07 DIAGNOSIS — M8088XD Other osteoporosis with current pathological fracture, vertebra(e), subsequent encounter for fracture with routine healing: Secondary | ICD-10-CM | POA: Diagnosis not present

## 2024-01-07 DIAGNOSIS — I82411 Acute embolism and thrombosis of right femoral vein: Secondary | ICD-10-CM | POA: Diagnosis not present

## 2024-01-07 DIAGNOSIS — R54 Age-related physical debility: Secondary | ICD-10-CM | POA: Diagnosis not present

## 2024-01-07 DIAGNOSIS — I824Z3 Acute embolism and thrombosis of unspecified deep veins of distal lower extremity, bilateral: Secondary | ICD-10-CM | POA: Diagnosis not present

## 2024-01-07 DIAGNOSIS — I708 Atherosclerosis of other arteries: Secondary | ICD-10-CM | POA: Diagnosis not present

## 2024-01-07 DIAGNOSIS — R52 Pain, unspecified: Secondary | ICD-10-CM | POA: Diagnosis not present

## 2024-01-07 DIAGNOSIS — Z66 Do not resuscitate: Secondary | ICD-10-CM | POA: Diagnosis not present

## 2024-01-07 DIAGNOSIS — Z681 Body mass index (BMI) 19 or less, adult: Secondary | ICD-10-CM | POA: Diagnosis not present

## 2024-01-07 DIAGNOSIS — I493 Ventricular premature depolarization: Secondary | ICD-10-CM | POA: Diagnosis not present

## 2024-01-07 DIAGNOSIS — E876 Hypokalemia: Secondary | ICD-10-CM | POA: Diagnosis not present

## 2024-01-07 DIAGNOSIS — I739 Peripheral vascular disease, unspecified: Secondary | ICD-10-CM | POA: Diagnosis not present

## 2024-01-07 DIAGNOSIS — I2782 Chronic pulmonary embolism: Secondary | ICD-10-CM | POA: Diagnosis not present

## 2024-01-07 DIAGNOSIS — Z751 Person awaiting admission to adequate facility elsewhere: Secondary | ICD-10-CM | POA: Diagnosis not present

## 2024-01-07 DIAGNOSIS — K810 Acute cholecystitis: Secondary | ICD-10-CM | POA: Diagnosis not present

## 2024-01-07 DIAGNOSIS — R748 Abnormal levels of other serum enzymes: Secondary | ICD-10-CM | POA: Diagnosis not present

## 2024-01-07 DIAGNOSIS — G8929 Other chronic pain: Secondary | ICD-10-CM | POA: Diagnosis not present

## 2024-01-07 DIAGNOSIS — I82433 Acute embolism and thrombosis of popliteal vein, bilateral: Secondary | ICD-10-CM | POA: Diagnosis not present

## 2024-01-07 DIAGNOSIS — R7989 Other specified abnormal findings of blood chemistry: Secondary | ICD-10-CM | POA: Diagnosis not present

## 2024-01-07 DIAGNOSIS — R5381 Other malaise: Secondary | ICD-10-CM | POA: Diagnosis not present

## 2024-01-07 DIAGNOSIS — E43 Unspecified severe protein-calorie malnutrition: Secondary | ICD-10-CM | POA: Diagnosis not present

## 2024-01-07 DIAGNOSIS — J9811 Atelectasis: Secondary | ICD-10-CM | POA: Diagnosis not present

## 2024-01-07 DIAGNOSIS — R310 Gross hematuria: Secondary | ICD-10-CM | POA: Diagnosis not present

## 2024-01-07 DIAGNOSIS — R109 Unspecified abdominal pain: Secondary | ICD-10-CM | POA: Diagnosis not present

## 2024-01-07 DIAGNOSIS — R627 Adult failure to thrive: Secondary | ICD-10-CM | POA: Diagnosis not present

## 2024-01-07 DIAGNOSIS — Z48815 Encounter for surgical aftercare following surgery on the digestive system: Secondary | ICD-10-CM | POA: Diagnosis not present

## 2024-01-07 DIAGNOSIS — M549 Dorsalgia, unspecified: Secondary | ICD-10-CM | POA: Diagnosis not present

## 2024-01-07 DIAGNOSIS — I82462 Acute embolism and thrombosis of left calf muscular vein: Secondary | ICD-10-CM | POA: Diagnosis not present

## 2024-01-07 DIAGNOSIS — Z7409 Other reduced mobility: Secondary | ICD-10-CM | POA: Diagnosis not present

## 2024-01-07 DIAGNOSIS — Z7901 Long term (current) use of anticoagulants: Secondary | ICD-10-CM | POA: Diagnosis not present

## 2024-01-07 DIAGNOSIS — N281 Cyst of kidney, acquired: Secondary | ICD-10-CM | POA: Diagnosis not present

## 2024-01-07 DIAGNOSIS — E860 Dehydration: Secondary | ICD-10-CM | POA: Diagnosis not present

## 2024-01-07 DIAGNOSIS — I491 Atrial premature depolarization: Secondary | ICD-10-CM | POA: Diagnosis not present

## 2024-01-07 DIAGNOSIS — I2699 Other pulmonary embolism without acute cor pulmonale: Secondary | ICD-10-CM | POA: Diagnosis not present

## 2024-01-07 DIAGNOSIS — Z515 Encounter for palliative care: Secondary | ICD-10-CM | POA: Diagnosis not present

## 2024-01-07 DIAGNOSIS — I081 Rheumatic disorders of both mitral and tricuspid valves: Secondary | ICD-10-CM | POA: Diagnosis not present

## 2024-01-21 ENCOUNTER — Other Ambulatory Visit: Payer: Self-pay | Admitting: Cardiology

## 2024-01-21 DIAGNOSIS — I480 Paroxysmal atrial fibrillation: Secondary | ICD-10-CM

## 2024-01-26 DIAGNOSIS — I824Z3 Acute embolism and thrombosis of unspecified deep veins of distal lower extremity, bilateral: Secondary | ICD-10-CM | POA: Diagnosis not present

## 2024-01-26 DIAGNOSIS — M8088XD Other osteoporosis with current pathological fracture, vertebra(e), subsequent encounter for fracture with routine healing: Secondary | ICD-10-CM | POA: Diagnosis not present

## 2024-01-26 DIAGNOSIS — Z681 Body mass index (BMI) 19 or less, adult: Secondary | ICD-10-CM | POA: Diagnosis not present

## 2024-01-26 DIAGNOSIS — L24A2 Irritant contact dermatitis due to fecal, urinary or dual incontinence: Secondary | ICD-10-CM | POA: Diagnosis not present

## 2024-01-26 DIAGNOSIS — R0902 Hypoxemia: Secondary | ICD-10-CM | POA: Diagnosis not present

## 2024-01-26 DIAGNOSIS — Z66 Do not resuscitate: Secondary | ICD-10-CM | POA: Diagnosis not present

## 2024-01-26 DIAGNOSIS — M6281 Muscle weakness (generalized): Secondary | ICD-10-CM | POA: Diagnosis not present

## 2024-01-26 DIAGNOSIS — L89153 Pressure ulcer of sacral region, stage 3: Secondary | ICD-10-CM | POA: Diagnosis not present

## 2024-01-26 DIAGNOSIS — L89310 Pressure ulcer of right buttock, unstageable: Secondary | ICD-10-CM | POA: Diagnosis not present

## 2024-01-26 DIAGNOSIS — K8020 Calculus of gallbladder without cholecystitis without obstruction: Secondary | ICD-10-CM | POA: Diagnosis not present

## 2024-01-26 DIAGNOSIS — R627 Adult failure to thrive: Secondary | ICD-10-CM | POA: Diagnosis not present

## 2024-01-26 DIAGNOSIS — L89322 Pressure ulcer of left buttock, stage 2: Secondary | ICD-10-CM | POA: Diagnosis not present

## 2024-01-26 DIAGNOSIS — Z20822 Contact with and (suspected) exposure to covid-19: Secondary | ICD-10-CM | POA: Diagnosis not present

## 2024-01-26 DIAGNOSIS — N390 Urinary tract infection, site not specified: Secondary | ICD-10-CM | POA: Diagnosis not present

## 2024-01-26 DIAGNOSIS — Z48815 Encounter for surgical aftercare following surgery on the digestive system: Secondary | ICD-10-CM | POA: Diagnosis not present

## 2024-01-26 DIAGNOSIS — D649 Anemia, unspecified: Secondary | ICD-10-CM | POA: Diagnosis not present

## 2024-01-26 DIAGNOSIS — I4891 Unspecified atrial fibrillation: Secondary | ICD-10-CM | POA: Diagnosis not present

## 2024-01-26 DIAGNOSIS — L89156 Pressure-induced deep tissue damage of sacral region: Secondary | ICD-10-CM | POA: Diagnosis not present

## 2024-01-26 DIAGNOSIS — I82432 Acute embolism and thrombosis of left popliteal vein: Secondary | ICD-10-CM | POA: Diagnosis not present

## 2024-01-26 DIAGNOSIS — I361 Nonrheumatic tricuspid (valve) insufficiency: Secondary | ICD-10-CM | POA: Diagnosis not present

## 2024-01-26 DIAGNOSIS — I82403 Acute embolism and thrombosis of unspecified deep veins of lower extremity, bilateral: Secondary | ICD-10-CM | POA: Diagnosis not present

## 2024-01-26 DIAGNOSIS — I82462 Acute embolism and thrombosis of left calf muscular vein: Secondary | ICD-10-CM | POA: Diagnosis not present

## 2024-01-26 DIAGNOSIS — E871 Hypo-osmolality and hyponatremia: Secondary | ICD-10-CM | POA: Diagnosis not present

## 2024-01-26 DIAGNOSIS — J690 Pneumonitis due to inhalation of food and vomit: Secondary | ICD-10-CM | POA: Diagnosis not present

## 2024-01-26 DIAGNOSIS — Z515 Encounter for palliative care: Secondary | ICD-10-CM | POA: Diagnosis not present

## 2024-01-26 DIAGNOSIS — R0600 Dyspnea, unspecified: Secondary | ICD-10-CM | POA: Diagnosis not present

## 2024-01-26 DIAGNOSIS — R64 Cachexia: Secondary | ICD-10-CM | POA: Diagnosis not present

## 2024-01-26 DIAGNOSIS — R609 Edema, unspecified: Secondary | ICD-10-CM | POA: Diagnosis not present

## 2024-01-26 DIAGNOSIS — R52 Pain, unspecified: Secondary | ICD-10-CM | POA: Diagnosis not present

## 2024-01-26 DIAGNOSIS — G894 Chronic pain syndrome: Secondary | ICD-10-CM | POA: Diagnosis not present

## 2024-01-26 DIAGNOSIS — A419 Sepsis, unspecified organism: Secondary | ICD-10-CM | POA: Diagnosis not present

## 2024-01-26 DIAGNOSIS — E43 Unspecified severe protein-calorie malnutrition: Secondary | ICD-10-CM | POA: Diagnosis not present

## 2024-01-26 DIAGNOSIS — R601 Generalized edema: Secondary | ICD-10-CM | POA: Diagnosis not present

## 2024-01-26 DIAGNOSIS — I4719 Other supraventricular tachycardia: Secondary | ICD-10-CM | POA: Diagnosis not present

## 2024-01-26 DIAGNOSIS — R0602 Shortness of breath: Secondary | ICD-10-CM | POA: Diagnosis not present

## 2024-01-26 DIAGNOSIS — I82411 Acute embolism and thrombosis of right femoral vein: Secondary | ICD-10-CM | POA: Diagnosis not present

## 2024-01-26 DIAGNOSIS — I4892 Unspecified atrial flutter: Secondary | ICD-10-CM | POA: Diagnosis not present

## 2024-01-26 DIAGNOSIS — R5381 Other malaise: Secondary | ICD-10-CM | POA: Diagnosis not present

## 2024-01-26 DIAGNOSIS — M8008XA Age-related osteoporosis with current pathological fracture, vertebra(e), initial encounter for fracture: Secondary | ICD-10-CM | POA: Diagnosis not present

## 2024-01-26 DIAGNOSIS — R000 Tachycardia, unspecified: Secondary | ICD-10-CM | POA: Diagnosis not present

## 2024-01-26 DIAGNOSIS — R1310 Dysphagia, unspecified: Secondary | ICD-10-CM | POA: Diagnosis not present

## 2024-01-26 DIAGNOSIS — I959 Hypotension, unspecified: Secondary | ICD-10-CM | POA: Diagnosis not present

## 2024-01-26 DIAGNOSIS — R531 Weakness: Secondary | ICD-10-CM | POA: Diagnosis not present

## 2024-01-26 DIAGNOSIS — R6520 Severe sepsis without septic shock: Secondary | ICD-10-CM | POA: Diagnosis not present

## 2024-01-26 DIAGNOSIS — Z439 Encounter for attention to unspecified artificial opening: Secondary | ICD-10-CM | POA: Diagnosis not present

## 2024-01-26 DIAGNOSIS — I2699 Other pulmonary embolism without acute cor pulmonale: Secondary | ICD-10-CM | POA: Diagnosis not present

## 2024-01-27 DIAGNOSIS — R627 Adult failure to thrive: Secondary | ICD-10-CM | POA: Diagnosis not present

## 2024-01-27 DIAGNOSIS — I82403 Acute embolism and thrombosis of unspecified deep veins of lower extremity, bilateral: Secondary | ICD-10-CM | POA: Diagnosis not present

## 2024-01-27 DIAGNOSIS — I2699 Other pulmonary embolism without acute cor pulmonale: Secondary | ICD-10-CM | POA: Diagnosis not present

## 2024-01-27 DIAGNOSIS — I4891 Unspecified atrial fibrillation: Secondary | ICD-10-CM | POA: Diagnosis not present

## 2024-01-27 DIAGNOSIS — R5381 Other malaise: Secondary | ICD-10-CM | POA: Diagnosis not present

## 2024-01-30 DIAGNOSIS — I82403 Acute embolism and thrombosis of unspecified deep veins of lower extremity, bilateral: Secondary | ICD-10-CM | POA: Diagnosis not present

## 2024-01-30 DIAGNOSIS — R5381 Other malaise: Secondary | ICD-10-CM | POA: Diagnosis not present

## 2024-01-30 DIAGNOSIS — I2699 Other pulmonary embolism without acute cor pulmonale: Secondary | ICD-10-CM | POA: Diagnosis not present

## 2024-01-30 DIAGNOSIS — I4891 Unspecified atrial fibrillation: Secondary | ICD-10-CM | POA: Diagnosis not present

## 2024-01-30 DIAGNOSIS — R627 Adult failure to thrive: Secondary | ICD-10-CM | POA: Diagnosis not present

## 2024-02-01 DIAGNOSIS — I2699 Other pulmonary embolism without acute cor pulmonale: Secondary | ICD-10-CM | POA: Diagnosis not present

## 2024-02-01 DIAGNOSIS — I82403 Acute embolism and thrombosis of unspecified deep veins of lower extremity, bilateral: Secondary | ICD-10-CM | POA: Diagnosis not present

## 2024-02-01 DIAGNOSIS — R627 Adult failure to thrive: Secondary | ICD-10-CM | POA: Diagnosis not present

## 2024-02-01 DIAGNOSIS — R5381 Other malaise: Secondary | ICD-10-CM | POA: Diagnosis not present

## 2024-02-01 DIAGNOSIS — I4891 Unspecified atrial fibrillation: Secondary | ICD-10-CM | POA: Diagnosis not present

## 2024-02-02 ENCOUNTER — Telehealth: Payer: Self-pay | Admitting: Family Medicine

## 2024-02-02 DIAGNOSIS — I82403 Acute embolism and thrombosis of unspecified deep veins of lower extremity, bilateral: Secondary | ICD-10-CM | POA: Diagnosis not present

## 2024-02-02 DIAGNOSIS — R627 Adult failure to thrive: Secondary | ICD-10-CM | POA: Diagnosis not present

## 2024-02-02 DIAGNOSIS — L24A2 Irritant contact dermatitis due to fecal, urinary or dual incontinence: Secondary | ICD-10-CM | POA: Diagnosis not present

## 2024-02-02 DIAGNOSIS — I2699 Other pulmonary embolism without acute cor pulmonale: Secondary | ICD-10-CM | POA: Diagnosis not present

## 2024-02-02 DIAGNOSIS — I4891 Unspecified atrial fibrillation: Secondary | ICD-10-CM | POA: Diagnosis not present

## 2024-02-02 DIAGNOSIS — R5381 Other malaise: Secondary | ICD-10-CM | POA: Diagnosis not present

## 2024-02-02 DIAGNOSIS — L89153 Pressure ulcer of sacral region, stage 3: Secondary | ICD-10-CM | POA: Diagnosis not present

## 2024-02-02 NOTE — Telephone Encounter (Signed)
 Copied from CRM [id number]. Topic: Appointments - Scheduling Inquiry for Clinic >> Feb 01, 2024  5:07 PM Shon Hale wrote: Reason for CRM: Debbe Odea with [facility]. Requesting sooner follow up. Patient is being discharged 02/04/2024 from skilled nursing facility.   Requesting patient be seen within 10 days of discharge. No availability seen with Dr. Linford Arnold until 4/21  Please follow up with patient to schedule appointment.  Called patient left vm that appointment was scheduled for Thursday March 20th at 10:50am

## 2024-02-03 DIAGNOSIS — I4891 Unspecified atrial fibrillation: Secondary | ICD-10-CM | POA: Diagnosis not present

## 2024-02-03 DIAGNOSIS — I82403 Acute embolism and thrombosis of unspecified deep veins of lower extremity, bilateral: Secondary | ICD-10-CM | POA: Diagnosis not present

## 2024-02-03 DIAGNOSIS — R5381 Other malaise: Secondary | ICD-10-CM | POA: Diagnosis not present

## 2024-02-03 DIAGNOSIS — R627 Adult failure to thrive: Secondary | ICD-10-CM | POA: Diagnosis not present

## 2024-02-03 DIAGNOSIS — I2699 Other pulmonary embolism without acute cor pulmonale: Secondary | ICD-10-CM | POA: Diagnosis not present

## 2024-02-04 DIAGNOSIS — R918 Other nonspecific abnormal finding of lung field: Secondary | ICD-10-CM | POA: Diagnosis not present

## 2024-02-04 DIAGNOSIS — K8020 Calculus of gallbladder without cholecystitis without obstruction: Secondary | ICD-10-CM | POA: Diagnosis not present

## 2024-02-04 DIAGNOSIS — L89310 Pressure ulcer of right buttock, unstageable: Secondary | ICD-10-CM | POA: Diagnosis not present

## 2024-02-04 DIAGNOSIS — L8995 Pressure ulcer of unspecified site, unstageable: Secondary | ICD-10-CM | POA: Diagnosis not present

## 2024-02-04 DIAGNOSIS — I959 Hypotension, unspecified: Secondary | ICD-10-CM | POA: Diagnosis not present

## 2024-02-04 DIAGNOSIS — R609 Edema, unspecified: Secondary | ICD-10-CM | POA: Diagnosis not present

## 2024-02-04 DIAGNOSIS — J690 Pneumonitis due to inhalation of food and vomit: Secondary | ICD-10-CM | POA: Diagnosis not present

## 2024-02-04 DIAGNOSIS — I361 Nonrheumatic tricuspid (valve) insufficiency: Secondary | ICD-10-CM | POA: Diagnosis not present

## 2024-02-04 DIAGNOSIS — E43 Unspecified severe protein-calorie malnutrition: Secondary | ICD-10-CM | POA: Diagnosis not present

## 2024-02-04 DIAGNOSIS — A419 Sepsis, unspecified organism: Secondary | ICD-10-CM | POA: Diagnosis not present

## 2024-02-04 DIAGNOSIS — I4892 Unspecified atrial flutter: Secondary | ICD-10-CM | POA: Diagnosis not present

## 2024-02-04 DIAGNOSIS — D649 Anemia, unspecified: Secondary | ICD-10-CM | POA: Diagnosis not present

## 2024-02-04 DIAGNOSIS — Z743 Need for continuous supervision: Secondary | ICD-10-CM | POA: Diagnosis not present

## 2024-02-04 DIAGNOSIS — L89322 Pressure ulcer of left buttock, stage 2: Secondary | ICD-10-CM | POA: Diagnosis not present

## 2024-02-04 DIAGNOSIS — I4891 Unspecified atrial fibrillation: Secondary | ICD-10-CM | POA: Diagnosis not present

## 2024-02-04 DIAGNOSIS — R601 Generalized edema: Secondary | ICD-10-CM | POA: Diagnosis not present

## 2024-02-04 DIAGNOSIS — L89156 Pressure-induced deep tissue damage of sacral region: Secondary | ICD-10-CM | POA: Diagnosis not present

## 2024-02-04 DIAGNOSIS — R1310 Dysphagia, unspecified: Secondary | ICD-10-CM | POA: Diagnosis not present

## 2024-02-04 DIAGNOSIS — N390 Urinary tract infection, site not specified: Secondary | ICD-10-CM | POA: Diagnosis not present

## 2024-02-04 DIAGNOSIS — I82432 Acute embolism and thrombosis of left popliteal vein: Secondary | ICD-10-CM | POA: Diagnosis not present

## 2024-02-04 DIAGNOSIS — R64 Cachexia: Secondary | ICD-10-CM | POA: Diagnosis not present

## 2024-02-04 DIAGNOSIS — Z20822 Contact with and (suspected) exposure to covid-19: Secondary | ICD-10-CM | POA: Diagnosis not present

## 2024-02-04 DIAGNOSIS — R531 Weakness: Secondary | ICD-10-CM | POA: Diagnosis not present

## 2024-02-04 DIAGNOSIS — R000 Tachycardia, unspecified: Secondary | ICD-10-CM | POA: Diagnosis not present

## 2024-02-04 DIAGNOSIS — E871 Hypo-osmolality and hyponatremia: Secondary | ICD-10-CM | POA: Diagnosis not present

## 2024-02-04 DIAGNOSIS — I491 Atrial premature depolarization: Secondary | ICD-10-CM | POA: Diagnosis not present

## 2024-02-04 DIAGNOSIS — R0602 Shortness of breath: Secondary | ICD-10-CM | POA: Diagnosis not present

## 2024-02-04 DIAGNOSIS — M8008XA Age-related osteoporosis with current pathological fracture, vertebra(e), initial encounter for fracture: Secondary | ICD-10-CM | POA: Diagnosis not present

## 2024-02-04 DIAGNOSIS — R627 Adult failure to thrive: Secondary | ICD-10-CM | POA: Diagnosis not present

## 2024-02-04 DIAGNOSIS — Z515 Encounter for palliative care: Secondary | ICD-10-CM | POA: Diagnosis not present

## 2024-02-04 DIAGNOSIS — R6520 Severe sepsis without septic shock: Secondary | ICD-10-CM | POA: Diagnosis not present

## 2024-02-04 DIAGNOSIS — Z66 Do not resuscitate: Secondary | ICD-10-CM | POA: Diagnosis not present

## 2024-02-04 DIAGNOSIS — I493 Ventricular premature depolarization: Secondary | ICD-10-CM | POA: Diagnosis not present

## 2024-02-04 DIAGNOSIS — I4719 Other supraventricular tachycardia: Secondary | ICD-10-CM | POA: Diagnosis not present

## 2024-02-04 DIAGNOSIS — Z681 Body mass index (BMI) 19 or less, adult: Secondary | ICD-10-CM | POA: Diagnosis not present

## 2024-02-04 DIAGNOSIS — E440 Moderate protein-calorie malnutrition: Secondary | ICD-10-CM | POA: Diagnosis not present

## 2024-02-04 DIAGNOSIS — I82411 Acute embolism and thrombosis of right femoral vein: Secondary | ICD-10-CM | POA: Diagnosis not present

## 2024-02-04 DIAGNOSIS — R0902 Hypoxemia: Secondary | ICD-10-CM | POA: Diagnosis not present

## 2024-02-09 ENCOUNTER — Inpatient Hospital Stay: Admitting: Family Medicine

## 2024-02-09 NOTE — Progress Notes (Deleted)
   Established Patient Office Visit  Subjective  Patient ID: ELAYAH KLOOSTER, female    DOB: 1951/04/11  Age: 73 y.o. MRN: [id number]  No chief complaint on file.   HPI  She is here today for hospital follow-up.  Was admitted on March 15 for increased shortness of breath and lower extremity swelling.  She was diagnosed with sepsis secondary to UTI and failure to thrive.  She decided to move forward with hospice care. Started on broad-spectrum antibiotics with cefepime, Flagyl and vancomycin while awaiting culture results. Her blood cultures returned negative but her urine culture was positive for Enterococcus faecium and patient was transitioned to linezolid p.o. was also noted to have a right buttock pressure wound which was managed by wound care during the hospitalization.  {History (Optional):23778}  ROS    Objective:     There were no vitals taken for this visit. {Vitals History (Optional):23777}  Physical Exam   No results found for any visits on 02/09/24.  {Labs (Optional):23779}  The ASCVD Risk score (Arnett DK, et al., 2019) failed to calculate for the following reasons:   Cannot find a previous HDL lab   Cannot find a previous total cholesterol lab    Assessment & Plan:   Problem List Items Addressed This Visit   None   No follow-ups on file.    Nani Gasser, MD

## 2024-02-13 ENCOUNTER — Inpatient Hospital Stay: Admitting: Family Medicine

## 2024-03-22 DEATH — deceased

## 2024-07-24 ENCOUNTER — Encounter: Payer: Self-pay | Admitting: Sports Medicine

## 2024-12-13 ENCOUNTER — Telehealth: Payer: Self-pay

## 2024-12-13 NOTE — Telephone Encounter (Signed)
 I called to schedule patient for an appointment with Dr Alvan to follow up on chronic issues.
